# Patient Record
Sex: Female | Born: 1945 | Race: White | Hispanic: No | Marital: Married | Smoking: Never smoker
Health system: Southern US, Community
[De-identification: ages and names within clinical notes are randomized; demographics above are authoritative.]

## PROBLEM LIST (undated history)

## (undated) DIAGNOSIS — K219 Gastro-esophageal reflux disease without esophagitis: Secondary | ICD-10-CM

## (undated) DIAGNOSIS — E785 Hyperlipidemia, unspecified: Secondary | ICD-10-CM

## (undated) DIAGNOSIS — I509 Heart failure, unspecified: Secondary | ICD-10-CM

## (undated) DIAGNOSIS — N1330 Unspecified hydronephrosis: Secondary | ICD-10-CM

## (undated) DIAGNOSIS — F32A Depression, unspecified: Secondary | ICD-10-CM

## (undated) DIAGNOSIS — F329 Major depressive disorder, single episode, unspecified: Secondary | ICD-10-CM

## (undated) DIAGNOSIS — G4730 Sleep apnea, unspecified: Secondary | ICD-10-CM

## (undated) DIAGNOSIS — M810 Age-related osteoporosis without current pathological fracture: Secondary | ICD-10-CM

## (undated) DIAGNOSIS — J45909 Unspecified asthma, uncomplicated: Secondary | ICD-10-CM

## (undated) DIAGNOSIS — K760 Fatty (change of) liver, not elsewhere classified: Secondary | ICD-10-CM

## (undated) DIAGNOSIS — C801 Malignant (primary) neoplasm, unspecified: Secondary | ICD-10-CM

## (undated) DIAGNOSIS — N135 Crossing vessel and stricture of ureter without hydronephrosis: Secondary | ICD-10-CM

## (undated) DIAGNOSIS — E119 Type 2 diabetes mellitus without complications: Secondary | ICD-10-CM

## (undated) DIAGNOSIS — K5790 Diverticulosis of intestine, part unspecified, without perforation or abscess without bleeding: Secondary | ICD-10-CM

## (undated) DIAGNOSIS — M1990 Unspecified osteoarthritis, unspecified site: Secondary | ICD-10-CM

## (undated) DIAGNOSIS — T8859XA Other complications of anesthesia, initial encounter: Secondary | ICD-10-CM

## (undated) DIAGNOSIS — M109 Gout, unspecified: Secondary | ICD-10-CM

## (undated) DIAGNOSIS — E039 Hypothyroidism, unspecified: Secondary | ICD-10-CM

## (undated) DIAGNOSIS — R0600 Dyspnea, unspecified: Secondary | ICD-10-CM

## (undated) DIAGNOSIS — R600 Localized edema: Secondary | ICD-10-CM

## (undated) DIAGNOSIS — F0280 Dementia in other diseases classified elsewhere without behavioral disturbance: Secondary | ICD-10-CM

## (undated) DIAGNOSIS — N289 Disorder of kidney and ureter, unspecified: Secondary | ICD-10-CM

## (undated) DIAGNOSIS — K682 Retroperitoneal fibrosis: Secondary | ICD-10-CM

## (undated) DIAGNOSIS — T4145XA Adverse effect of unspecified anesthetic, initial encounter: Secondary | ICD-10-CM

## (undated) DIAGNOSIS — S3710XA Unspecified injury of ureter, initial encounter: Secondary | ICD-10-CM

## (undated) DIAGNOSIS — R911 Solitary pulmonary nodule: Secondary | ICD-10-CM

## (undated) DIAGNOSIS — I10 Essential (primary) hypertension: Secondary | ICD-10-CM

## (undated) DIAGNOSIS — G309 Alzheimer's disease, unspecified: Secondary | ICD-10-CM

## (undated) DIAGNOSIS — F419 Anxiety disorder, unspecified: Secondary | ICD-10-CM

## (undated) DIAGNOSIS — Z87442 Personal history of urinary calculi: Secondary | ICD-10-CM

## (undated) DIAGNOSIS — J449 Chronic obstructive pulmonary disease, unspecified: Secondary | ICD-10-CM

## (undated) HISTORY — DX: Crossing vessel and stricture of ureter without hydronephrosis: N13.5

## (undated) HISTORY — DX: Gout, unspecified: M10.9

## (undated) HISTORY — DX: Essential (primary) hypertension: I10

## (undated) HISTORY — PX: ANKLE FRACTURE SURGERY: SHX122

## (undated) HISTORY — DX: Unspecified asthma, uncomplicated: J45.909

## (undated) HISTORY — PX: REDUCTION MAMMAPLASTY: SUR839

## (undated) HISTORY — PX: LAPAROSCOPIC HYSTERECTOMY: SHX1926

## (undated) HISTORY — DX: Anxiety disorder, unspecified: F41.9

## (undated) HISTORY — DX: Type 2 diabetes mellitus without complications: E11.9

## (undated) HISTORY — DX: Alzheimer's disease, unspecified: G30.9

## (undated) HISTORY — DX: Unspecified osteoarthritis, unspecified site: M19.90

## (undated) HISTORY — DX: Hyperlipidemia, unspecified: E78.5

## (undated) HISTORY — PX: ABDOMINAL HYSTERECTOMY: SHX81

## (undated) HISTORY — DX: Unspecified hydronephrosis: N13.30

## (undated) HISTORY — PX: REPLACEMENT TOTAL KNEE: SUR1224

## (undated) HISTORY — DX: Hypothyroidism, unspecified: E03.9

## (undated) HISTORY — DX: Retroperitoneal fibrosis: K68.2

## (undated) HISTORY — DX: Depression, unspecified: F32.A

## (undated) HISTORY — DX: Solitary pulmonary nodule: R91.1

## (undated) HISTORY — PX: OTHER SURGICAL HISTORY: SHX169

## (undated) HISTORY — DX: Dementia in other diseases classified elsewhere, unspecified severity, without behavioral disturbance, psychotic disturbance, mood disturbance, and anxiety: F02.80

## (undated) HISTORY — DX: Major depressive disorder, single episode, unspecified: F32.9

---
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
## 1988-08-06 HISTORY — PX: BREAST SURGERY: SHX581

## 1998-08-06 HISTORY — PX: CARPAL TUNNEL RELEASE: SHX101

## 2003-08-07 HISTORY — PX: HERNIA REPAIR: SHX51

## 2005-05-30 ENCOUNTER — Ambulatory Visit: Payer: Self-pay | Admitting: General Surgery

## 2005-06-12 ENCOUNTER — Ambulatory Visit: Payer: Self-pay | Admitting: General Surgery

## 2005-06-13 ENCOUNTER — Ambulatory Visit: Payer: Self-pay | Admitting: General Surgery

## 2007-09-09 ENCOUNTER — Ambulatory Visit: Payer: Self-pay | Admitting: Unknown Physician Specialty

## 2007-09-16 ENCOUNTER — Ambulatory Visit: Payer: Self-pay | Admitting: Unknown Physician Specialty

## 2008-11-17 ENCOUNTER — Ambulatory Visit: Payer: Self-pay | Admitting: Obstetrics and Gynecology

## 2008-12-08 ENCOUNTER — Ambulatory Visit: Payer: Self-pay | Admitting: Obstetrics & Gynecology

## 2009-08-06 HISTORY — PX: JOINT REPLACEMENT: SHX530

## 2009-12-26 ENCOUNTER — Ambulatory Visit: Payer: Self-pay | Admitting: General Practice

## 2010-01-13 ENCOUNTER — Inpatient Hospital Stay: Payer: Self-pay | Admitting: General Practice

## 2010-01-16 ENCOUNTER — Encounter: Payer: Self-pay | Admitting: Internal Medicine

## 2010-02-03 ENCOUNTER — Encounter: Payer: Self-pay | Admitting: Internal Medicine

## 2010-09-06 ENCOUNTER — Ambulatory Visit: Payer: Self-pay

## 2010-12-19 NOTE — Assessment & Plan Note (Signed)
NAME:  Lori Blanchard, Lori Blanchard                ACCOUNT NO.:  [id number]   MEDICAL RECORD NO.:  [id number]          PATIENT TYPE:  POB   LOCATION:  [facility] at [facility]         FACILITY:  [facility]   PHYSICIAN:  Argentina Donovan, MD        DATE OF BIRTH:  09/09/1945   DATE OF SERVICE:                                  CLINIC NOTE   The patient is a 65 year old Caucasian female gravida 3, para 3-0-0-3,  with a history of 3 cesarean sections who is in because of a complaint  of 6 months duration of lower abdominal pain especially on the left, but  across the whole pelvic area.  The patient has a long medical history  with hyperlipidemia, hypertension, diabetes type 2, and hypothyroidism.  She needs a knee replacement and has shortness of breath on any length,  any time of walking.  She has been followed by private internist for her  medical problems and has multiple medications as you can see by the  chart.   SURGICAL HISTORY:  Three cesarean sections and appendectomy, breast  reduction, a total abdominal hysterectomy.  She had abdominoplasty with  repair of an umbilical hernia and she has had carpal tunnel surgery.   FAMILY HISTORY:  Significant for diabetes, high blood pressure with her  father and mother had breast cancer in early 40s and died from  metastasis at the age of 66.   The patient says that this abdominal pain started about 6 months ago.  She has almost all the times and sometimes it wakes her up especially  with the pain in her left side lower quadrant, which radiates around to  her left side.   PHYSICAL EXAMINATION:  VITAL SIGNS:  Her blood pressure is 130/72, her  pulse is 90, her weight is 192, and she is 4 feet 8 inches tall.  GENERAL:  She is a well-developed, rounded little Caucasian lady in no  acute distress.  ABDOMEN:  Rotund.  Seems almost distended, but nontender to palpation  without guarding or rebound.  Good bowel sounds.  A large abdominoplasty  scar in the lower abdomen  across the entire lower abdomen and a small  umbilical scar.  The abdomen is tympanic to percussion and does not show  any sign of fluid shifts on palpation.  PELVIC:  Genitalia, external is normal.  BUS within normal limits.  The  vagina is clean, somewhat atrophic and status hysterectomy.  Bimanual  pelvic examination was nonrevealing because of the habitus of the  patient.  EXTREMITIES:  Both knees appear to be swollen.  She has mild  varicosities and no edema noted.   IMPRESSION:  Chronic pelvic pain, probably secondary to pelvic  adhesions.  Ultrasound for evaluation of the ovaries will be carried  out.  My feeling is if there is no ovarian pathology, there will not be  much that can be done to relieve this patient's symptoms.  It seem to  bother her more she says at waking up her night or when she tries to  hang clothes on the line.  She will be seen following her ultrasound  results.  ______________________________  Argentina Donovan, MD    PR/MEDQ  D:  11/17/2008  T:  11/18/2008  Job:  [id number]

## 2010-12-19 NOTE — Assessment & Plan Note (Signed)
[name], Lori Blanchard                ACCOUNT NO.:  [id number]   MEDICAL RECORD NO.:  [id number]           PATIENT TYPE:   LOCATION:  [facility] at [facility]           FACILITY:   PHYSICIAN:  Scheryl Darter, MD       DATE OF BIRTH:  [birth year]   DATE OF SERVICE:                                  CLINIC NOTE   The patient returns for followup after an ultrasound was done on November 22, 2008.  Ultrasound was ordered to evaluate pelvic pain, which is  intermittent and chronic.  The patient is status post total abdominal  hysterectomy.  Ultrasound of the pelvis states status post hysterectomy,  neither ovary could be identified.  Slight nodular prominence of the  cervix of questionable significance.  Dr. Serita Kyle examination confirms  that cervix is not present.  The patient states that she has had  colonoscopy, which diagnosed a diverticulosis.  Now the possible  etiology for the pain is diverticulosis.  Recommend any other testing by  [place].  She has other issues with pain including arthritis.  Recommend she  follow up here as requested by her physicians.      Scheryl Darter, MD     JA/MEDQ  D:  12/08/2008  T:  12/09/2008  Job:  [id number]

## 2011-08-07 HISTORY — PX: JOINT REPLACEMENT: SHX530

## 2011-08-08 ENCOUNTER — Ambulatory Visit: Payer: Self-pay | Admitting: General Practice

## 2011-08-08 DIAGNOSIS — I10 Essential (primary) hypertension: Secondary | ICD-10-CM

## 2011-08-08 LAB — BASIC METABOLIC PANEL
BUN: 16 mg/dL (ref 7–18)
Calcium, Total: 9.1 mg/dL (ref 8.5–10.1)
Co2: 24 mmol/L (ref 21–32)
Creatinine: 0.68 mg/dL (ref 0.60–1.30)
EGFR (African American): >60
EGFR (Non-African Amer.): >60
Glucose: 184 mg/dL — ABNORMAL HIGH (ref 65–99)
Potassium: 4 mmol/L (ref 3.5–5.1)
Sodium: 142 mmol/L (ref 136–145)

## 2011-08-08 LAB — URINALYSIS, COMPLETE
Blood: NEGATIVE
Ketone: NEGATIVE
Leukocyte Esterase: NEGATIVE
Nitrite: NEGATIVE
Ph: 7 (ref 4.5–8.0)
Protein: NEGATIVE
RBC,UR: 4 /HPF (ref 0–5)
Squamous Epithelial: 3

## 2011-08-08 LAB — CBC
HCT: 32.9 % — ABNORMAL LOW (ref 35.0–47.0)
MCH: 24.4 pg — ABNORMAL LOW (ref 26.0–34.0)
MCHC: 31 g/dL — ABNORMAL LOW (ref 32.0–36.0)
MCV: 79 fL — ABNORMAL LOW (ref 80–100)
RBC: 4.19 10*6/uL (ref 3.80–5.20)
RDW: 15.4 % — ABNORMAL HIGH (ref 11.5–14.5)

## 2011-08-08 LAB — PROTIME-INR
INR: 1
Prothrombin Time: 13 secs (ref 11.5–14.7)

## 2011-08-08 LAB — SEDIMENTATION RATE: Erythrocyte Sed Rate: 13 mm/hr (ref 0–30)

## 2011-08-22 ENCOUNTER — Inpatient Hospital Stay: Payer: Self-pay | Admitting: General Practice

## 2011-08-23 ENCOUNTER — Encounter: Payer: Self-pay | Admitting: Internal Medicine

## 2011-08-23 LAB — BASIC METABOLIC PANEL
BUN: 13 mg/dL (ref 7–18)
Calcium, Total: 7.8 mg/dL — ABNORMAL LOW (ref 8.5–10.1)
EGFR (African American): >60
EGFR (Non-African Amer.): >60
Glucose: 184 mg/dL — ABNORMAL HIGH (ref 65–99)
Osmolality: 290 (ref 275–301)
Sodium: 143 mmol/L (ref 136–145)

## 2011-08-23 LAB — HEMOGLOBIN: HGB: 9 g/dL — ABNORMAL LOW (ref 12.0–16.0)

## 2011-08-23 LAB — PLATELET COUNT: Platelet: 295 10*3/uL (ref 150–440)

## 2011-08-24 LAB — PLATELET COUNT: Platelet: 277 10*3/uL (ref 150–440)

## 2011-08-24 LAB — BASIC METABOLIC PANEL
BUN: 10 mg/dL (ref 7–18)
Calcium, Total: 7.9 mg/dL — ABNORMAL LOW (ref 8.5–10.1)
EGFR (African American): >60
EGFR (Non-African Amer.): >60
Glucose: 169 mg/dL — ABNORMAL HIGH (ref 65–99)
Osmolality: 290 (ref 275–301)
Potassium: 3.8 mmol/L (ref 3.5–5.1)
Sodium: 144 mmol/L (ref 136–145)

## 2011-08-25 LAB — HEMOGLOBIN: HGB: 9.1 g/dL — ABNORMAL LOW (ref 12.0–16.0)

## 2011-09-07 ENCOUNTER — Encounter: Payer: Self-pay | Admitting: Internal Medicine

## 2012-10-13 ENCOUNTER — Ambulatory Visit: Payer: Self-pay | Admitting: Unknown Physician Specialty

## 2012-11-13 ENCOUNTER — Ambulatory Visit: Payer: Self-pay | Admitting: Unknown Physician Specialty

## 2012-11-14 LAB — PATHOLOGY REPORT

## 2012-11-28 ENCOUNTER — Ambulatory Visit: Payer: Self-pay

## 2012-12-11 ENCOUNTER — Ambulatory Visit: Payer: Self-pay

## 2012-12-30 ENCOUNTER — Ambulatory Visit: Payer: Self-pay | Admitting: Internal Medicine

## 2013-01-20 ENCOUNTER — Ambulatory Visit: Payer: Self-pay | Admitting: Urology

## 2013-01-30 ENCOUNTER — Ambulatory Visit: Payer: Self-pay | Admitting: Urology

## 2013-03-18 ENCOUNTER — Ambulatory Visit: Payer: Self-pay | Admitting: Oncology

## 2013-03-19 ENCOUNTER — Ambulatory Visit: Payer: Self-pay | Admitting: Oncology

## 2013-03-30 LAB — CBC CANCER CENTER
Basophil #: 0.1 x10 3/mm (ref 0.0–0.1)
Eosinophil #: 0.3 x10 3/mm (ref 0.0–0.7)
HGB: 10.3 g/dL — ABNORMAL LOW (ref 12.0–16.0)
Lymphocyte #: 2.3 x10 3/mm (ref 1.0–3.6)
MCHC: 32.6 g/dL (ref 32.0–36.0)
MCV: 75 fL — ABNORMAL LOW (ref 80–100)
Monocyte #: 0.9 x10 3/mm (ref 0.2–0.9)
Monocyte %: 7.5 %
Neutrophil %: 69.2 %
Platelet: 345 x10 3/mm (ref 150–440)
RBC: 4.18 10*6/uL (ref 3.80–5.20)
RDW: 15.4 % — ABNORMAL HIGH (ref 11.5–14.5)
WBC: 11.6 x10 3/mm — ABNORMAL HIGH (ref 3.6–11.0)

## 2013-03-30 LAB — COMPREHENSIVE METABOLIC PANEL
Alkaline Phosphatase: 61 U/L (ref 50–136)
Anion Gap: 7 (ref 7–16)
Bilirubin,Total: 0.5 mg/dL (ref 0.2–1.0)
Calcium, Total: 8.9 mg/dL (ref 8.5–10.1)
Chloride: 105 mmol/L (ref 98–107)
Co2: 25 mmol/L (ref 21–32)
EGFR (Non-African Amer.): 38 — ABNORMAL LOW
Osmolality: 284 (ref 275–301)
SGOT(AST): 29 U/L (ref 15–37)
SGPT (ALT): 24 U/L (ref 12–78)
Total Protein: 7.1 g/dL (ref 6.4–8.2)

## 2013-04-06 ENCOUNTER — Ambulatory Visit: Payer: Self-pay | Admitting: Oncology

## 2013-04-15 LAB — IRON AND TIBC
Iron Bind.Cap.(Total): 567 ug/dL — ABNORMAL HIGH
Iron Saturation: 6 %
Iron: 35 ug/dL — ABNORMAL LOW
Unbound Iron-Bind.Cap.: 532 ug/dL

## 2013-04-15 LAB — RETICULOCYTES: Absolute Retic Count: 0.0784 10*6/uL (ref 0.019–0.186)

## 2013-04-15 LAB — FERRITIN: Ferritin (ARMC): 16 ng/mL (ref 8–388)

## 2013-04-15 LAB — LACTATE DEHYDROGENASE: LDH: 172 U/L (ref 81–246)

## 2013-05-06 ENCOUNTER — Ambulatory Visit: Payer: Self-pay | Admitting: Oncology

## 2013-05-18 ENCOUNTER — Ambulatory Visit: Payer: Self-pay | Admitting: Urology

## 2013-05-18 LAB — BASIC METABOLIC PANEL
Calcium, Total: 8.9 mg/dL (ref 8.5–10.1)
EGFR (Non-African Amer.): 51 — ABNORMAL LOW
Osmolality: 284 (ref 275–301)
Potassium: 3.8 mmol/L (ref 3.5–5.1)

## 2013-05-18 LAB — HEMOGLOBIN: HGB: 10.1 g/dL — ABNORMAL LOW (ref 12.0–16.0)

## 2013-06-01 ENCOUNTER — Ambulatory Visit: Payer: Self-pay | Admitting: Urology

## 2013-06-06 ENCOUNTER — Ambulatory Visit: Payer: Self-pay | Admitting: Oncology

## 2013-06-10 ENCOUNTER — Ambulatory Visit: Payer: Self-pay | Admitting: Specialist

## 2013-07-06 ENCOUNTER — Ambulatory Visit: Payer: Self-pay | Admitting: Oncology

## 2013-07-28 ENCOUNTER — Ambulatory Visit: Payer: Self-pay | Admitting: Specialist

## 2013-08-06 ENCOUNTER — Ambulatory Visit: Payer: Self-pay | Admitting: Oncology

## 2013-10-06 ENCOUNTER — Ambulatory Visit: Payer: Self-pay | Admitting: Oncology

## 2013-10-08 ENCOUNTER — Ambulatory Visit: Payer: Self-pay | Admitting: Specialist

## 2013-10-23 ENCOUNTER — Ambulatory Visit: Payer: Self-pay | Admitting: Internal Medicine

## 2013-10-25 LAB — BRONCHIAL WASH CULTURE

## 2013-11-04 ENCOUNTER — Ambulatory Visit: Payer: Self-pay | Admitting: Oncology

## 2013-11-13 LAB — CULTURE, FUNGUS WITHOUT SMEAR

## 2013-11-18 ENCOUNTER — Ambulatory Visit: Payer: Self-pay | Admitting: Specialist

## 2013-11-23 ENCOUNTER — Ambulatory Visit: Payer: Self-pay | Admitting: Urology

## 2013-11-23 LAB — BASIC METABOLIC PANEL
ANION GAP: 5 — AB (ref 7–16)
BUN: 19 mg/dL — ABNORMAL HIGH (ref 7–18)
CHLORIDE: 103 mmol/L (ref 98–107)
CREATININE: 1.21 mg/dL (ref 0.60–1.30)
Calcium, Total: 8.6 mg/dL (ref 8.5–10.1)
Co2: 28 mmol/L (ref 21–32)
EGFR (Non-African Amer.): 46 — ABNORMAL LOW
GFR CALC AF AMER: 54 — AB
Glucose: 412 mg/dL — ABNORMAL HIGH (ref 65–99)
Osmolality: 292 (ref 275–301)
Potassium: 3.8 mmol/L (ref 3.5–5.1)
Sodium: 136 mmol/L (ref 136–145)

## 2013-11-23 LAB — CBC
HCT: 30 % — AB (ref 35.0–47.0)
HGB: 9.4 g/dL — ABNORMAL LOW (ref 12.0–16.0)
MCH: 23.6 pg — ABNORMAL LOW (ref 26.0–34.0)
MCHC: 31.5 g/dL — ABNORMAL LOW (ref 32.0–36.0)
MCV: 75 fL — AB (ref 80–100)
Platelet: 310 10*3/uL (ref 150–440)
RBC: 3.99 10*6/uL (ref 3.80–5.20)
RDW: 15.3 % — AB (ref 11.5–14.5)
WBC: 10.9 10*3/uL (ref 3.6–11.0)

## 2013-12-03 ENCOUNTER — Ambulatory Visit: Payer: Self-pay | Admitting: Urology

## 2014-01-30 DIAGNOSIS — M1990 Unspecified osteoarthritis, unspecified site: Secondary | ICD-10-CM | POA: Insufficient documentation

## 2014-01-30 DIAGNOSIS — N183 Chronic kidney disease, stage 3 unspecified: Secondary | ICD-10-CM | POA: Insufficient documentation

## 2014-01-30 DIAGNOSIS — E1122 Type 2 diabetes mellitus with diabetic chronic kidney disease: Secondary | ICD-10-CM | POA: Insufficient documentation

## 2014-01-30 DIAGNOSIS — E039 Hypothyroidism, unspecified: Secondary | ICD-10-CM | POA: Insufficient documentation

## 2014-01-30 DIAGNOSIS — G4733 Obstructive sleep apnea (adult) (pediatric): Secondary | ICD-10-CM | POA: Insufficient documentation

## 2014-01-30 DIAGNOSIS — E1169 Type 2 diabetes mellitus with other specified complication: Secondary | ICD-10-CM | POA: Insufficient documentation

## 2014-01-30 DIAGNOSIS — E785 Hyperlipidemia, unspecified: Secondary | ICD-10-CM

## 2014-01-30 DIAGNOSIS — I10 Essential (primary) hypertension: Secondary | ICD-10-CM | POA: Insufficient documentation

## 2014-05-13 DIAGNOSIS — J449 Chronic obstructive pulmonary disease, unspecified: Secondary | ICD-10-CM | POA: Insufficient documentation

## 2014-05-26 ENCOUNTER — Ambulatory Visit: Payer: Self-pay | Admitting: Urology

## 2014-05-26 LAB — CBC WITH DIFFERENTIAL/PLATELET
BASOS ABS: 0 10*3/uL (ref 0.0–0.1)
Basophil %: 0.4 %
Eosinophil #: 0.2 10*3/uL (ref 0.0–0.7)
Eosinophil %: 1.4 %
HCT: 30.3 % — AB (ref 35.0–47.0)
HGB: 9.2 g/dL — AB (ref 12.0–16.0)
LYMPHS PCT: 21.3 %
Lymphocyte #: 2.4 10*3/uL (ref 1.0–3.6)
MCH: 22.2 pg — AB (ref 26.0–34.0)
MCHC: 30.2 g/dL — AB (ref 32.0–36.0)
MCV: 74 fL — ABNORMAL LOW (ref 80–100)
MONO ABS: 0.9 x10 3/mm (ref 0.2–0.9)
Monocyte %: 8 %
NEUTROS PCT: 68.9 %
Neutrophil #: 7.9 10*3/uL — ABNORMAL HIGH (ref 1.4–6.5)
Platelet: 327 10*3/uL (ref 150–440)
RBC: 4.12 10*6/uL (ref 3.80–5.20)
RDW: 15.4 % — ABNORMAL HIGH (ref 11.5–14.5)
WBC: 11.4 10*3/uL — ABNORMAL HIGH (ref 3.6–11.0)

## 2014-05-26 LAB — BASIC METABOLIC PANEL
Anion Gap: 9 (ref 7–16)
BUN: 16 mg/dL (ref 7–18)
CHLORIDE: 106 mmol/L (ref 98–107)
Calcium, Total: 8.2 mg/dL — ABNORMAL LOW (ref 8.5–10.1)
Co2: 26 mmol/L (ref 21–32)
Creatinine: 1.08 mg/dL (ref 0.60–1.30)
EGFR (African American): >60
GFR CALC NON AF AMER: 54 — AB
Glucose: 194 mg/dL — ABNORMAL HIGH (ref 65–99)
Osmolality: 288 (ref 275–301)
Potassium: 3.9 mmol/L (ref 3.5–5.1)
Sodium: 141 mmol/L (ref 136–145)

## 2014-06-07 ENCOUNTER — Ambulatory Visit: Payer: Self-pay | Admitting: Urology

## 2014-08-13 ENCOUNTER — Ambulatory Visit: Payer: Self-pay | Admitting: Internal Medicine

## 2014-10-18 ENCOUNTER — Ambulatory Visit: Payer: Self-pay | Admitting: Urology

## 2014-10-25 ENCOUNTER — Ambulatory Visit: Payer: Self-pay | Admitting: Urology

## 2014-11-26 NOTE — Op Note (Signed)
PATIENT NAME:  Lori Blanchard, Lori Blanchard MR#:  [id number] DATE OF BIRTH:  [birth year]  DATE OF PROCEDURE:  01/30/2013  PREOPERATIVE DIAGNOSIS: Right hydronephrosis.   POSTOPERATIVE DIAGNOSIS:  Right hydronephrosis, no cause.   OPERATION:  Right cysto, right retrograde pyelogram with stent.   ANESTHESIA: General.   SPECIMENS:  None.   FINDINGS:  Deviated ureter medially with no apparent internal obstruction.   SURGEON: Wilberta Dorvil D. Elnoria Howard, M.D.   COMPLICATIONS: None.   BLOOD LOSS:  Minimal.  PROCEDURE IN DETAIL:  With the patient sterilely prepped and draped in a supine lithotomy position for ease of approach to the external genitalia, I began. I applied the 21-French sheath and Foroblique lens into the bladder without difficulty. Ureter is easily seen, instrumented with an open-ended 5-French ureteral catheter. Contrast put up the catheter. The ureter appears to be deviated toward the midline. So, the open-ended catheter is slid up carefully to about the level of L4, and then a hydrophilic 6.754 Glidewire was placed up the ureteral catheter and easily bypasses any kinking or deviation in the ureter and straightens the ureter right out and goes into the renal pelvis. Curls well in the renal pelvis, so I am able to place a 6-French, 26 cm stent up into the kidney with the one end well curled in the bladder. The ureter is now straight, and no  hydronephrotic drip is seen. The stent is left in place. String is cut. B and O suppository placed in the rectum. Rectal exam reveals no fixation of the bladder. The patient is sent to recovery in satisfactory condition with 30 mL of 0.5% Marcaine in the bladder.     ____________________________ Janice Coffin. Elnoria Howard, DO rdh:dmm D: 01/30/2013 14:59:35 ET T: 01/30/2013 22:21:35 ET JOB#: [id number]  cc: Janice Coffin. Elnoria Howard, DO, <Dictator> Kaileigh Viswanathan D Dylyn Mclaren DO ELECTRONICALLY SIGNED 02/27/2013 16:34

## 2014-11-26 NOTE — Op Note (Signed)
PATIENT NAME:  KEVIN, MARIO MR#:  [id number] DATE OF BIRTH:  Jul 11, 1946  DATE OF PROCEDURE:  06/01/2013  PREOPERATIVE DIAGNOSIS: Chronic right hydronephrosis with need for stent exchange and left flank pain.   POSTOPERATIVE DIAGNOSES:  1.  Normal left renal collecting system.  2.  Chronic  right hydronephrosis stent exchange.   PROCEDURE:   1.  Cystoscopy.  2.  Stent exchange.  3.  Left retrograde pyelogram.   PROCEDURE IN DETAIL:  With the patient sterilely prepped and draped in supine lithotomy position approach, easily approached to the external genitalia, I began. After appropriate timeout, I entered the bladder with a 21 French panendoscope sheath and Foroblique lens and removed the right stent. Instrument  was a 0.038 Glidewire and moved the stent over the wire. The wire was in in good position. So, I placed the 24 cm stent into position in the renal pelvis and bladder. Then, through the 5 [place] open-ended catheter, I did a left retrograde pyelogram. Left side has no hydronephrosis, perfectly normal with no caliectasis or evidence of obstructing stone, tumor mass or growth in the left ureter or kidney. So the bladder was then emptied. The position of the stent is checked. It is curled in good position in the bladder and in the renal pelvis on the right and string is cut, bladder is emptied, 30 mL of Marcaine placed in the bladder. B and O suppository placed in the rectum. The bladder itself showed no tumors, masses, growth, infection or sign even sign of irritation from the stent.   ____________________________ Janice Coffin. Elnoria Howard, [place] rdh:cc D: 06/01/2013 16:23:27 ET T: 06/01/2013 23:56:34 ET JOB#: [id number]  cc: Janice Coffin. Elnoria Howard, DO, <Dictator> RICHARD D HART DO ELECTRONICALLY SIGNED 06/29/2013 7:05

## 2014-11-27 NOTE — Op Note (Signed)
PATIENT NAME:  Lori Blanchard, Lori Blanchard MR#:  [id number] DATE OF BIRTH:  Nov 23, 1945  DATE OF PROCEDURE:  06/07/2014  PREOPERATIVE DIAGNOSIS: Persistent right hydronephrosis.   POSTOPERATIVE DIAGNOSIS: Persistent right hydronephrosis.    PROCEDURE: Stent exchange.   ANESTHESIA: General.   COMPLICATIONS: None.   DESCRIPTION OF PROCEDURE: The patient was sterilely prepped and draped in supine lithotomy position, and after appropriate timeout, I placed the scope and the stent graspers into the bladder. The right sided stent was grasped and removed. Then an 0.038 wire, which is a Sensor wire, was placed up the ureter and over this a 6 French 24 cm stent is placed into the kidney and then into the bladder. This 24 cm stent is in good position with both ends curled both in the bladder and renal pelvis. The patient tolerated the procedure well. Bladder was emptied, 30 mL of 0.5% Marcaine placed in the bladder and she is sent to recovery in satisfactory condition after B ad O suppository was placed in her rectum. No rectal mass or growth felt. There are hemorrhoids in the rectum.     ____________________________ Janice Coffin. Elnoria Howard, DO rdh:AT D: 06/07/2014 14:31:27 ET T: 06/07/2014 22:50:18 ET JOB#: [id number]  cc: Janice Coffin. Elnoria Howard, DO, <Dictator> Britanie Harshman D Cailynn Bodnar DO ELECTRONICALLY SIGNED 06/21/2014 16:34

## 2014-11-27 NOTE — Op Note (Signed)
PATIENT NAME:  Lori Blanchard, Lori Blanchard MR#:  [id number] DATE OF BIRTH:  1946/03/07  DATE OF PROCEDURE:  12/03/2013  PREOPERATIVE DIAGNOSIS: Ureteral obstruction with hydronephrosis, external obstruction, right ureter.   POSTOPERATIVE DIAGNOSIS: Ureteral obstruction with hydronephrosis, external obstruction, right ureter.   PROCEDURE: Cystoscopy with placement of a right ureteral stent.   ANESTHESIA: General.   SURGEON: Richard D. Elnoria Howard, DO  COMPLICATIONS: None.   DESCRIPTION OF PROCEDURE: The patient was sterilely prepped and draped in supine lithotomy position, and after an appropriate timeout agreed to by all parties, the procedure begins. Cystoscopy is done, and then utilizing fluoroscopy, a 0.038 wire was placed up the right ureter into the renal pelvis. Over the wire, a 24 cm, 6 French ureteral catheter was placed in its proper placement in the renal pelvis and the bladder. It is checked at the beginning and end of the procedure both with fluoroscopy and visualization. The bladder otherwise shows no tumors, masses, gross evidence of infection, cystitis. Ureters are in normal position and have normal orifices. Bladder is emptied, and 30 mL of 0.5% Marcaine is placed in the bladder. She is sent to recovery in satisfactory condition.   ____________________________ Janice Coffin. Elnoria Howard, DO rdh:lb D: 12/03/2013 12:15:44 ET T: 12/03/2013 13:22:54 ET JOB#: [id number]  cc: Janice Coffin. Elnoria Howard, DO, <Dictator> RICHARD D HART DO ELECTRONICALLY SIGNED 12/04/2013 8:18

## 2014-11-28 NOTE — Op Note (Signed)
PATIENT NAME:  Lori Blanchard, Lori Blanchard MR#:  [id number] DATE OF BIRTH:  Apr 16, 1946  DATE OF PROCEDURE:  08/22/2011  PREOPERATIVE DIAGNOSIS: Degenerative arthrosis of the left knee.   POSTOPERATIVE DIAGNOSIS: Degenerative arthrosis of the left knee.   PROCEDURE PERFORMED: Left total knee arthroplasty using computer-assisted navigation.   SURGEON: Skip Estimable, M.D.   ASSISTANT: Vance Peper, PA-C (required to maintain retraction throughout the procedure)   ANESTHESIA: Femoral nerve block and spinal.   ESTIMATED BLOOD LOSS: 150 mL.   FLUIDS REPLACED: 900 mL of crystalloid.   TOURNIQUET TIME: 95 minutes.   DRAINS: Two medium drains to reinfusion system.   SOFT TISSUE RELEASES: Anterior cruciate ligament, posterior cruciate ligament, deep medial collateral ligament, patellofemoral ligament, and posterolateral corner.   IMPLANTS UTILIZED: DePuy PFC Sigma size two posterior stabilized femoral component (cemented), size two MBT tibial component (cemented), 32-mm three peg oval dome patella (cemented), and a 10-mm stabilized rotating platform polyethylene insert.   INDICATIONS FOR SURGERY: The patient is a 69 year old female who has been seen for complaints of progressive left knee pain and valgus deformity. X-rays demonstrated severe degenerative changes in tricompartmental fashion with significant valgus deformity. After discussion of the risks and benefits of surgical intervention, the patient expressed her understanding of the risks and benefits and agreed with plans for surgical intervention.   PROCEDURE IN DETAIL: The patient was brought into the operating room and, after adequate femoral nerve block and spinal anesthesia was achieved, a tourniquet was placed on the patient's upper left thigh. The patient's left knee and leg were cleaned and prepped with alcohol and DuraPrep and draped in the usual sterile fashion. A "time-out" was performed as per usual protocol. The left lower extremity was  exsanguinated using an Esmarch, and the tourniquet was inflated to 300 mmHg. An anterior longitudinal incision was made followed by a standard mid vastus approach. A moderate effusion was evacuated.  The deep fibers of the medial collateral ligament were elevated in a subperiosteal fashion off the medial flare of the tibia so as to maintain a continuous soft tissue sleeve. The patella was subluxed laterally and the patellofemoral ligament was incised. Inspection of the knee demonstrated severe degenerative changes with evidence of eburnated bone to the lateral compartment. Prominent osteophytes were debrided using a rongeur. Anterior and posterior cruciate ligaments were excised. Two 4-mm Schanz pins were inserted into the femur and into the tibia for attachment of the array of spheres used for computer-assisted navigation. The hip center was identified using a circumduction technique. Distal landmarks were mapped using the computer. The distal femur and proximal tibia were mapped using the computer. Distal femoral cutting guide was positioned using computer-assisted navigation so as to achieve a 5-degree distal valgus cut. The cut was performed and verified using the computer. The distal femur was sized and it was felt that a size two femoral component was appropriate. A size two cutting guide was positioned using computer-assisted navigation and the anterior cut was performed and verified using the computer. This was followed by completion of the posterior and chamfer cuts. Femoral cutting guide for the central box was then positioned and the central box cut was performed.   Attention was then directed to the proximal tibia. Medial and lateral menisci were excised. The extramedullary tibial cutting guide was positioned using computer-assisted navigation so as to achieve 0-degree varus valgus alignment and 0-degree posterior slope. Cut was performed and verified using the computer. The proximal tibia was sized  and it was felt that a  size two tibial tray was appropriate. Tibial and femoral trials were inserted followed by insertion of a 10-mm polyethylene trial. The knee was felt to be tight laterally. Trial components were removed. The knee was placed in extension and distracted using [place] retractors. The posterolateral corner was felt to be extremely tight. The posterolateral corner was carefully released using a combination of electrocautery and Metzenbaum scissors. Trial components were reinserted followed by insertion of a 10-mm polyethylene insert. Good medial and lateral soft tissue balancing was appreciated both in full extension and in 90 degrees of flexion. Finally, the patella was cut and prepared so as to accommodate a 32-mm three peg oval dome patella. Patellar trial was placed and the knee was placed through a range of motion with excellent patellar tracking appreciated.   The femoral trial was removed. Central post hole for the tibial component was reamed followed by insertion of a keel punch. Tibial trial was then removed. Cut surfaces of bone were irrigated with copious amounts of normal saline with antibiotic solution using pulsatile lavage and then suctioned dry. Polymethyl methacrylate cement with gentamicin was prepared in the usual fashion using a vacuum mixer. Gentamicin cement was used due to the patient's history of diabetes. Cement was applied to the cut surface of the tibia as well as along the undersurface of a size 2 MBT tibial component. The tibial component was positioned and impacted into place. Excess cement was removed using [employer] fast streamer. Cement was then applied to the cut surface of the femur as well as along the undersurface of a size 2 posterior stabilized femoral component. Femoral component was positioned and impacted into place. Excess cement was removed using [employer] fast streamer. A 10-mm polyethylene trial was inserted and the knee was brought into full extension with steady  axial compression applied. Finally, cement was applied to the backside of a 32-mm three peg oval dome patella and the patellar component was positioned and patellar clamp applied. Excess cement was removed using [employer] fast streamer.   After adequate curing of cement, the tourniquet was deflated after a total tourniquet time of 95 minutes. Hemostasis was achieved using electrocautery. The knee was irrigated with copious amounts of normal saline with antibiotic solution using pulsatile lavage and then suctioned dry. The knee was inspected for any residual cement debris. 30 mL of 0.25% Marcaine with epinephrine was injected along the posterior capsule. A 10-mm stabilized rotating platform polyethylene insert was inserted and the knee was placed through a range of motion. Excellent patellar tracking was appreciated and good medial and lateral soft tissue balancing was appreciated. Two medium drains were placed in the wound bed and brought out through a separate stab incision to be attached to a reinfusion system. The medial parapatellar portion of the incision was reapproximated using interrupted sutures of #1 Vicryl. The subcutaneous tissue was approximated in layers using first #0 Vicryl followed by #2-0 Vicryl.  The skin was closed with skin staples. Sterile dressing was applied.   The patient tolerated the procedure well. She was transported to the recovery room in stable condition.    ____________________________ Laurice Record. Holley Bouche., MD jph:bjt D: 08/22/2011 18:14:27 ET T: 08/23/2011 09:58:48 ET JOB#: [id number]  cc: Laurice Record. Holley Bouche., MD, <Dictator> Laurice Record Holley Bouche MD ELECTRONICALLY SIGNED 08/24/2011 6:41

## 2014-11-28 NOTE — Discharge Summary (Signed)
PATIENT NAME:  Lori Blanchard, HUBERS MR#:  [id number] DATE OF BIRTH:  1946-01-11  DATE OF ADMISSION:  08/22/2011 DATE OF DISCHARGE:  08/25/2011  ADMITTING DIAGNOSIS: Degenerative arthrosis of left knee.   DISCHARGE DIAGNOSIS: Degenerative arthrosis of left knee.   HISTORY: Patient is a 69 year old pleasant female who has been followed at [facility] for discomfort to the left knee. She had reported a long-term history of left knee pain. The patient had previously undergone a left knee arthroscopy. She continued to have increasing discomfort to the left knee. She did not see any improvement following the cortisone as well as activity modification and anti-inflammatory. Patient had localized most of the pain along the medial aspect of the knee. She had also noted some decrease in her range of motion. Her pain was noted to be aggravated with weight-bearing activities. On occasion patient had reported some swelling of the knee as well as near giving way but denied any gross locking. She states the pain had progressed to the point that it was significantly interfering with her activities of daily living. X-rays taken in the clinic showed narrowing of the lateral cartilage space with associated valgus alignment. She was noted to have osteophyte as well as subchondral sclerosis. After discussion of the risks and benefits of surgical intervention, the patient expressed her understanding of the risks and benefits and agreed for plans for surgical intervention.   PROCEDURE: Left total knee arthroplasty using computer-assisted navigation.   ANESTHESIA: Femoral nerve block with spinal.   SOFT TISSUE RELEASE: Anterior cruciate ligament, posterior cruciate ligament, deep medial collateral ligament, patellofemoral ligament, as well as the posterolateral corner.   IMPLANTS UTILIZED: DePuy PFC size 2 posterior stabilized femoral component (cemented), size 2 MBT tibial component (cemented), 32 mm three pegged  oval dome patella (cemented), and a 10 mm stabilized rotating platform polyethylene insert.   HOSPITAL COURSE: Patient tolerated procedure very well. She had no complications. She was then taken to PAC-U where she was stabilized then transferred to the orthopedic floor. She began receiving anticoagulation therapy of Lovenox 30 mg subcutaneous every 12 hours per anesthesia and pharmacy protocol. She was fitted with TED stockings bilaterally. These are allowed to be removed one hour per eight hour shift. The left one was applied on day two following removal of the Hemovac and dressing change. Patient was also fitted with the AV-I compression foot pumps bilaterally set at 130 mmHg. Her calves have been nontender, free of any evidence of any deep venous thromboses. Negative Homans sign. Heels were elevated off the bed using rolled towels. Heels have been nontender. No tissue breakdown noted.   Patient's vital signs have been stable. She has been afebrile. Hemodynamically she was stable. No transfusions were given other than the Autovac transfusions given the first six hours. Laboratory studies have all been within normal limits. Hemoglobin did drop to 8.5 mg but she was asymptomatic. No transfusions were given.   Physical therapy was initiated on day one for gait training and transfers. This has been extremely slow. Occupational therapy was also initiated on day one for activities of daily living and assistive devices.   Patient's IV, Foley and Hemovac were all discontinued on day two along with a dressing change. The wound was free of any drainage or any signs of infection. Polar Care was reapplied to the surgical leg maintaining a temperature of 40 to 50 degrees Fahrenheit.   DISPOSITION: Patient is being discharged to skilled nursing in improved stable condition.   DISCHARGE  INSTRUCTIONS:  1. She will continue weight-bearing as tolerated.  2. Knee immobilizer until she is able to do 10 straight leg  raises on her own.  3. Incentive spirometry every hour while awake.  4. Encourage cough, deep breathing every two hours while awake.  5. Polar Care to the surgical leg maintaining a temperature of 40 to 50 degrees Fahrenheit.  6. Continue with TED stockings. These are allowed to be removed one hour per eight hour shift.  7. Physical therapy for gait training and transfers. Occupational therapy for activities of daily living and assistive devices.  8. She is placed on an ADA diet.  9. Breathing treatments as needed.  10. She has a follow-up appointment on January 31 with Vance Peper, PA at 3:00 p.m. A second follow-up appointment with Dr. Skip Estimable at 9:45 on February 28. She is to call the clinic sooner if any complications.   DRUG ALLERGIES: Ibuprofen.   MEDICATIONS:  1. Tylenol ES 500 to 1000 mg every 4 to 6 hours p.r.n. for pain.  2. Celebrex 200 mg b.i.d.  3. Roxicodone 5 to 10 mg every four hours p.r.n.  4. Ultram 50 to 100 mg every four hours p.r.n.  5. Dulcolax suppositories 10 mg rectally daily p.r.n. for constipation.  6. Milk of magnesia 30 mL b.i.d.  7. Enema soapsuds if no results with milk of magnesia or Dulcolax.  8. Mylanta DS 30 mL every six hours p.r.n.  9. Pantoprazole 40 mg b.i.d.  10. Senokot-S 1 tablet b.i.d.  11. Insulin sliding scale Novolin R injections.  12. Allopurinol 200 mg daily. 13. Fosamax 70 me weekly.  14. Lipitor 40 mg daily. 15. Glucophage 1000 mg b.i.d. with meals.  16. Micardis 40 mg daily. 17. TriCor micronized 145 mg daily. 18. Neurontin 200 mg t.i.d.  19. Celexa 20 mg daily. Albuterol SVN 2.5 q.i.d.  20. Humalog Mix 50/50, 15 units subcutaneous twice a day a.c.  21. Synthroid 0.05 mg q.6 a.m.  22. Lasix 20 mg daily. 23. Vitamin E capsule 400 units daily.  24. Lovenox 30 mg subcutaneous every 12 hours for 14 days, then discontinue and begin taking one 81 mg enteric coated aspirin per day.   PAST MEDICAL HISTORY:   1. Arthritis. 2. Chickenpox. 3. Diabetes. 4. Gout.  5. Hernia.  6. Hypercholesterolemia.      7. Hypertension.  8. Shingles.   ____________________________ Vance Peper, PA jrw:cms D: 08/24/2011 09:26:00 ET T: 08/24/2011 09:51:22 ET JOB#: [id number]  cc: Vance Peper, PA, <Dictator> Neftaly Inzunza PA ELECTRONICALLY SIGNED 08/24/2011 16:12

## 2014-12-05 NOTE — Op Note (Signed)
PATIENT NAME:  Lori Blanchard, Lori Blanchard MR#:  [id number] DATE OF BIRTH:  1946/05/03  DATE OF PROCEDURE:  10/25/2014  PREOPERATIVE DIAGNOSIS: Right hydronephrosis with ureteral stricture.   POSTOPERATIVE DIAGNOSIS: Right hydronephrosis with ureteral stricture.   PROCEDURE: Cystoscopy, removal of right stent and replacement with a 1-year stent.   ANESTHESIA: General.   COMPLICATIONS: None.   FINDINGS: Continued right hydronephrosis with stricture and external compression, the stricture from external compression.   SURGEON: Kanani Mowbray D. Elnoria Howard, DO.   ANESTHESIA: General.   DESCRIPTION OF PROCEDURE:  With the patient sterilely prepped and draped in supine lithotomy position after an appropriate timeout, we put a wire up and removed the right stent and over the wire put a permanent stent exchange system from [facility], removed the inner working and place through the clear, working a metal stent. Put the stent all the way up into the kidney. In a push-pull method we are able to maintain it in position in both the ureter and the bladder, it was curled well in the kidney and properly in the bladder. The stent is in good position in both, it is checked both cystoscopically and by fluoroscopy. Then we empty the bladder and 30 mL of Marcaine are placed in the bladder and I put a B and O suppository in the rectum. No rectal masses or tumors he fell. She was sent to recovery in satisfactory condition.    ____________________________ Janice Coffin. Elnoria Howard, DO rdh:bu D: 10/25/2014 15:41:02 ET T: 10/25/2014 16:22:43 ET JOB#: [id number]  cc: Janice Coffin. Elnoria Howard, DO, <Dictator> Aleksey Newbern D Idabelle Mcpeters DO ELECTRONICALLY SIGNED 11/15/2014 12:57

## 2014-12-28 DIAGNOSIS — Z0000 Encounter for general adult medical examination without abnormal findings: Secondary | ICD-10-CM | POA: Insufficient documentation

## 2015-03-30 ENCOUNTER — Ambulatory Visit: Payer: Self-pay | Admitting: Urology

## 2015-04-05 ENCOUNTER — Encounter: Payer: Self-pay | Admitting: Urology

## 2015-04-13 ENCOUNTER — Ambulatory Visit: Payer: Self-pay | Admitting: Urology

## 2015-04-21 ENCOUNTER — Ambulatory Visit (INDEPENDENT_AMBULATORY_CARE_PROVIDER_SITE_OTHER): Payer: Medicare Other | Admitting: Obstetrics and Gynecology

## 2015-04-21 ENCOUNTER — Encounter: Payer: Self-pay | Admitting: Obstetrics and Gynecology

## 2015-04-21 VITALS — BP 151/84 | HR 97 | Ht <= 58 in | Wt 160.2 lb

## 2015-04-21 DIAGNOSIS — Z960 Presence of urogenital implants: Secondary | ICD-10-CM

## 2015-04-21 DIAGNOSIS — N135 Crossing vessel and stricture of ureter without hydronephrosis: Secondary | ICD-10-CM | POA: Diagnosis not present

## 2015-04-21 NOTE — Progress Notes (Signed)
04/21/2015 9:26 AM   Lori Blanchard 01/20/1946 [id number]  Referring provider: No referring provider defined for this encounter.  Chief Complaint  Patient presents with  . 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 He presents today to discuss planning and preop for her stent exchange. She denies any changes in her past medical history. She recently saw her pulmonologist Dr. Raul Del and was told her pulmonary function was slightly better than her baseline.  She is complaining of some stent discomfort today.  Last stent exchange 10/25/14 by Dr. Elnoria Howard for ureteral stricture and right hydronephrosis.   PMH: Past Medical History  Diagnosis Date  . Hyperlipemia   . Hypertension   . Asthma   . Osteoarthritis   . Gout   . Osteoarthritis   . Hypothyroidism   . Anxiety and depression   . Diabetes   . Hydronephrosis   . Retroperitoneal fibrosis   . Pulmonary nodule     Surgical History: Past Surgical History  Procedure Laterality Date  . Laparoscopic hysterectomy    . Carpal tunnel release    . Breast surgery    . Replacement total knee Bilateral   . Cesarean section      Home Medications:    Medication List       This list is accurate as of: 04/21/15 11:59 PM.  Always use your most recent med list.               albuterol (2.5 MG/3ML) 0.083% nebulizer solution  Commonly known as:  PROVENTIL  Inhale into the lungs.     PROAIR HFA 108 (90 BASE) MCG/ACT inhaler  Generic drug:  albuterol  USE 2 PUFFS EVERY FOUR HOURS AS NEEDED.     alendronate 70 MG tablet  Commonly known as:  FOSAMAX  TAKE ONE TABLET EVERY WEEK. TAKE WITH A FUUL GLASS OF WATER AND DO NOT LIE DOWN FOR THE NEXT 30 MINUTES.     aspirin EC 81 MG tablet  Take by mouth.     atorvastatin 40 MG tablet  Commonly known  as:  LIPITOR  Take by mouth.     buPROPion 150 MG 24 hr tablet  Commonly known as:  WELLBUTRIN XL  Take by mouth.     fenofibrate 145 MG tablet  Commonly known as:  TRICOR  TAKE ONE (1) TABLET EACH DAY     furosemide 20 MG tablet  Commonly known as:  LASIX  Take by mouth.     insulin lispro 100 UNIT/ML KiwkPen  Commonly known as:  HUMALOG  Inject into the skin.     LANTUS SOLOSTAR 100 UNIT/ML Solostar Pen  Generic drug:  Insulin Glargine     levothyroxine 50 MCG tablet  Commonly known as:  SYNTHROID, LEVOTHROID  TAKE 1 TABLET ON AN EMPTY STOMACH AT LEAST 30-60 MINUTES BEFORE BREAKFAST     metFORMIN 1000 MG tablet  Commonly known as:  GLUCOPHAGE  TAKE 1 TABLET BY MOUTH IN THE MORNING AND 1 AND 1/2 TABLETS AT SUPPER     pantoprazole 40 MG tablet  Commonly known as:  PROTONIX  Take by mouth.     PEN NEEDLES 31GX5/16" 31G X 8 MM Misc  Inject into the skin.     SURE COMFORT PEN NEEDLES 31G X 5 MM Misc  Generic drug:  Insulin  Pen Needle  USE 4 TIMES A DAY OR AS DIRECTED BY DOCTOR     RA VITAMIN B-12 TR 1000 MCG Tbcr  Generic drug:  Cyanocobalamin  Take by mouth.     senna 8.6 MG tablet  Commonly known as:  SENOKOT  Take by mouth.     telmisartan 40 MG tablet  Commonly known as:  MICARDIS  TAKE ONE (1) TABLET EACH DAY     VICTOZA 18 MG/3ML Sopn  Generic drug:  Liraglutide  INJECT 1.8 MG SUBCUTANEOUSLY ONCE DAILY     vitamin E 1000 UNIT capsule  Take by mouth.        Allergies:  Allergies  Allergen Reactions  . Ibuprofen Itching, Nausea Only and Other (See Comments)    Family History: Family History  Problem Relation Age of Onset  . Liver cancer Mother   . Colon cancer Mother   . Breast cancer Mother   . Diabetes Daughter   . Kidney disease Daughter     adrenal tumors  . Kidney cancer Neg Hx   . Prostate cancer Neg Hx     Social History:  reports that she has never smoked. She does not have any smokeless tobacco history on file. She reports  that she does not drink alcohol or use illicit drugs.  ROS: UROLOGY Frequent Urination?: Yes Hard to postpone urination?: Yes Burning/pain with urination?: Yes Get up at night to urinate?: Yes Leakage of urine?: Yes Urine stream starts and stops?: No Trouble starting stream?: No Do you have to strain to urinate?: No Blood in urine?: No Urinary tract infection?: Yes Sexually transmitted disease?: No Injury to kidneys or bladder?: No Painful intercourse?: Yes Weak stream?: Yes Currently pregnant?: No Vaginal bleeding?: No Last menstrual period?: n  Gastrointestinal Nausea?: No Vomiting?: No Indigestion/heartburn?: Yes Diarrhea?: No Constipation?: Yes  Constitutional Fever: No Night sweats?: No Weight loss?: No Fatigue?: No  Skin Skin rash/lesions?: No Itching?: No  Eyes Blurred vision?: No Double vision?: No  Ears/Nose/Throat Sore throat?: No Sinus problems?: No  Hematologic/Lymphatic Swollen glands?: No Easy bruising?: No  Cardiovascular Leg swelling?: Yes Chest pain?: No  Respiratory Cough?: Yes Shortness of breath?: Yes  Endocrine Excessive thirst?: No  Musculoskeletal Back pain?: No Joint pain?: No  Neurological Headaches?: No Dizziness?: No  Psychologic Depression?: Yes Anxiety?: Yes  Physical Exam: BP 151/84 mmHg  Pulse 97  Ht 4\' 8"  (1.422 m)  Wt 160 lb 3.2 oz (72.666 kg)  BMI 35.94 kg/m2  Constitutional:  Alert and oriented, No acute distress. HEENT: [place] AT, moist mucus membranes.  Trachea midline, no masses. Cardiovascular: No clubbing, cyanosis, or edema, RRR Respiratory: Normal respiratory effort, no increased work of breathing, slightly decreased bu clear breath sounds GI: Abdomen is soft, nontender, nondistended, no abdominal masses GU: No CVA tenderness.  Skin: No rashes, bruises or suspicious lesions. Lymph: No cervical or inguinal adenopathy. Neurologic: Grossly intact, no focal deficits, moving all 4  extremities. Psychiatric: Normal mood and affect.  Laboratory Data:   Urinalysis    Component Value Date/Time   COLORURINE Yellow 08/08/2011 1106   APPEARANCEUR Cloudy 08/08/2011 1106   LABSPEC 1.015 08/08/2011 1106   PHURINE 7.0 08/08/2011 1106   GLUCOSEU Negative 04/21/2015 1614   GLUCOSEU Negative 08/08/2011 1106   HGBUR Negative 08/08/2011 1106   BILIRUBINUR Negative 04/21/2015 1614   BILIRUBINUR Negative 08/08/2011 1106   KETONESUR Negative 08/08/2011 1106   PROTEINUR Negative 08/08/2011 1106   NITRITE Negative 04/21/2015 1614   NITRITE Negative 08/08/2011 1106  LEUKOCYTESUR Trace* 04/21/2015 1614   LEUKOCYTESUR Negative 08/08/2011 1106    Pertinent Imaging:   Assessment & Plan:  69 year old female with significant comorbidities and 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  1. Right ureteral stricture with right hydronephrosis- patient is not a good candidate for more extensive surgery to repair her right ureteral stricture. She has done well with chronic stent placement. We will schedule patient for stent exchange with Dr. Pilar Jarvis. Urine culture ordered today. Patient will need a pulmonary clearance from her pulmonologist Dr. Raul Del prior to surgery.  2. Ureteral stent retained- - Urinalysis, Complete  No Follow-up on file.  These notes generated with voice recognition software. I apologize for typographical errors.  Herbert Moors, [facility] [address], [place], [place] [address] [phone]

## 2015-04-22 DIAGNOSIS — J449 Chronic obstructive pulmonary disease, unspecified: Secondary | ICD-10-CM | POA: Insufficient documentation

## 2015-04-22 DIAGNOSIS — K469 Unspecified abdominal hernia without obstruction or gangrene: Secondary | ICD-10-CM | POA: Insufficient documentation

## 2015-04-22 DIAGNOSIS — G4710 Hypersomnia, unspecified: Secondary | ICD-10-CM | POA: Insufficient documentation

## 2015-04-22 DIAGNOSIS — F419 Anxiety disorder, unspecified: Secondary | ICD-10-CM | POA: Insufficient documentation

## 2015-04-22 DIAGNOSIS — G4730 Sleep apnea, unspecified: Secondary | ICD-10-CM

## 2015-04-22 DIAGNOSIS — E668 Other obesity: Secondary | ICD-10-CM | POA: Insufficient documentation

## 2015-04-22 DIAGNOSIS — F3341 Major depressive disorder, recurrent, in partial remission: Secondary | ICD-10-CM | POA: Insufficient documentation

## 2015-04-22 DIAGNOSIS — K760 Fatty (change of) liver, not elsewhere classified: Secondary | ICD-10-CM | POA: Insufficient documentation

## 2015-04-22 DIAGNOSIS — M109 Gout, unspecified: Secondary | ICD-10-CM | POA: Insufficient documentation

## 2015-04-22 DIAGNOSIS — K635 Polyp of colon: Secondary | ICD-10-CM | POA: Insufficient documentation

## 2015-04-22 DIAGNOSIS — J45909 Unspecified asthma, uncomplicated: Secondary | ICD-10-CM | POA: Insufficient documentation

## 2015-04-22 LAB — URINALYSIS, COMPLETE
BILIRUBIN UA: NEGATIVE
Glucose, UA: NEGATIVE
Ketones, UA: NEGATIVE
Nitrite, UA: NEGATIVE
PH UA: 5.5 (ref 5.0–7.5)
Protein, UA: NEGATIVE
Specific Gravity, UA: 1.01 (ref 1.005–1.030)
Urobilinogen, Ur: 0.2 mg/dL (ref 0.2–1.0)

## 2015-04-22 LAB — MICROSCOPIC EXAMINATION
BACTERIA UA: NONE SEEN
EPITHELIAL CELLS (NON RENAL): NONE SEEN /HPF (ref 0–10)

## 2015-04-23 LAB — CULTURE, URINE COMPREHENSIVE

## 2015-04-26 ENCOUNTER — Telehealth: Payer: Self-pay | Admitting: Radiology

## 2015-04-26 NOTE — Telephone Encounter (Signed)
Pt notified of surgery scheduled 05/11/15 and pre-admit testing appt on 05/03/15. Advised pt to be npo after mn on day of surgery and call the day before for arrival time. Pt verbalizes understanding.

## 2015-05-03 ENCOUNTER — Encounter
Admission: RE | Admit: 2015-05-03 | Discharge: 2015-05-03 | Disposition: A | Discharge status: Home / Self Care | Payer: Medicare Other | Source: Ambulatory Visit | Attending: Urology | Admitting: Urology

## 2015-05-03 DIAGNOSIS — N131 Hydronephrosis with ureteral stricture, not elsewhere classified: Secondary | ICD-10-CM | POA: Diagnosis not present

## 2015-05-03 DIAGNOSIS — Z01818 Encounter for other preprocedural examination: Secondary | ICD-10-CM | POA: Diagnosis present

## 2015-05-03 LAB — BASIC METABOLIC PANEL
Anion gap: 8 (ref 5–15)
BUN: 14 mg/dL (ref 6–20)
CALCIUM: 9 mg/dL (ref 8.9–10.3)
CO2: 24 mmol/L (ref 22–32)
Chloride: 105 mmol/L (ref 101–111)
Creatinine, Ser: 0.83 mg/dL (ref 0.44–1.00)
GFR calc Af Amer: >60 mL/min (ref >60)
GLUCOSE: 245 mg/dL — AB (ref 65–99)
Potassium: 3.9 mmol/L (ref 3.5–5.1)
Sodium: 137 mmol/L (ref 135–145)

## 2015-05-03 LAB — CBC
HEMATOCRIT: 32 % — AB (ref 35.0–47.0)
Hemoglobin: 10.1 g/dL — ABNORMAL LOW (ref 12.0–16.0)
MCH: 23.1 pg — ABNORMAL LOW (ref 26.0–34.0)
MCHC: 31.5 g/dL — AB (ref 32.0–36.0)
MCV: 73.4 fL — AB (ref 80.0–100.0)
PLATELETS: 303 10*3/uL (ref 150–440)
RBC: 4.36 MIL/uL (ref 3.80–5.20)
RDW: 15.5 % — AB (ref 11.5–14.5)
WBC: 8.6 10*3/uL (ref 3.6–11.0)

## 2015-05-03 NOTE — OR Nursing (Signed)
Cleared by Dr Raul Del

## 2015-05-03 NOTE — OR Nursing (Signed)
As instructed by Dr Assunta Gambles, request for clearance called and faxed to Dr Ouida Sills and Dr Pilar Jarvis.spoke with Randell Patient and Amy

## 2015-05-03 NOTE — Patient Instructions (Signed)
  Your procedure is scheduled on: Wednesday Oct. 5, 2016 Report to Same Day Surgery. To find out your arrival time please call [phone] between 1PM - 3PM on Tuesday Oct. 4, 2016.Marland Kitchen  Remember: Instructions that are not followed completely may result in serious medical risk, up to and including death, or upon the discretion of your surgeon and anesthesiologist your surgery may need to be rescheduled.    _x___ 1. Do not eat food or drink liquids after midnight. No gum chewing or hard candies.     ____ 2. No Alcohol for 24 hours before or after surgery.   ____ 3. Bring all medications with you on the day of surgery if instructed.    _x__ 4. Notify your doctor if there is any change in your medical condition     (cold, fever, infections).     Do not wear jewelry, make-up, hairpins, clips or nail polish.  Do not wear lotions, powders, or perfumes. You may wear deodorant.  Do not shave 48 hours prior to surgery. Men may shave face and neck.  Do not bring valuables to the hospital.    [facility] is not responsible for any belongings or valuables.               Contacts, dentures or bridgework may not be worn into surgery.  Leave your suitcase in the car. After surgery it may be brought to your room.  For patients admitted to the hospital, discharge time is determined by your treatment team.   Patients discharged the day of surgery will not be allowed to drive home.    Please read over the following fact sheets that you were given:   [facility] Preparing for Surgery  __x_ Take these medicines the morning of surgery with A SIP OF WATER:    1. atorvastatin (LIPITOR)   2. buPROPion (WELLBUTRIN XL)   3. fenofibrate (TRICOR)  4.levothyroxine (SYNTHROID, LEVOTHROID)  5.pantoprazole (PROTONIX)    ____ Fleet Enema (as directed)   ____ Use CHG Soap as directed  __x__ Use inhalers on the day of surgery and bring inhaler to hospital.  __x__ Stop metformin 2 days prior to surgery on  Oct. 3, 2016    __x__ Take 1/2 of usual insulin dose the night before surgery and none on the morning of surgery.   __x__ Stop aspirin on Sept. 28, 2016.  ____ Stop Anti-inflammatories on does not apply, pt is allergic.   ____ Stop supplements until after surgery.    __x__ Bring C-Pap to the hospital.

## 2015-05-04 NOTE — OR Nursing (Signed)
Cleared by Dr Ouida Sills.

## 2015-05-11 ENCOUNTER — Encounter: Payer: Self-pay | Admitting: *Deleted

## 2015-05-11 ENCOUNTER — Ambulatory Visit: Payer: Medicare Other | Admitting: Registered Nurse

## 2015-05-11 ENCOUNTER — Ambulatory Visit
Admission: RE | Admit: 2015-05-11 | Discharge: 2015-05-11 | Disposition: A | Discharge status: Home / Self Care | Payer: Medicare Other | Source: Ambulatory Visit | Attending: Urology | Admitting: Urology

## 2015-05-11 ENCOUNTER — Encounter
Admission: RE | Disposition: A | Discharge status: Home / Self Care | Payer: Self-pay | Source: Ambulatory Visit | Attending: Urology

## 2015-05-11 DIAGNOSIS — Z800 Family history of malignant neoplasm of digestive organs: Secondary | ICD-10-CM | POA: Diagnosis not present

## 2015-05-11 DIAGNOSIS — Q6211 Congenital occlusion of ureteropelvic junction: Secondary | ICD-10-CM

## 2015-05-11 DIAGNOSIS — N183 Chronic kidney disease, stage 3 (moderate): Secondary | ICD-10-CM | POA: Diagnosis not present

## 2015-05-11 DIAGNOSIS — J45909 Unspecified asthma, uncomplicated: Secondary | ICD-10-CM | POA: Insufficient documentation

## 2015-05-11 DIAGNOSIS — E039 Hypothyroidism, unspecified: Secondary | ICD-10-CM | POA: Diagnosis not present

## 2015-05-11 DIAGNOSIS — Z833 Family history of diabetes mellitus: Secondary | ICD-10-CM | POA: Insufficient documentation

## 2015-05-11 DIAGNOSIS — E785 Hyperlipidemia, unspecified: Secondary | ICD-10-CM | POA: Insufficient documentation

## 2015-05-11 DIAGNOSIS — Z90710 Acquired absence of both cervix and uterus: Secondary | ICD-10-CM | POA: Diagnosis not present

## 2015-05-11 DIAGNOSIS — Z96653 Presence of artificial knee joint, bilateral: Secondary | ICD-10-CM | POA: Insufficient documentation

## 2015-05-11 DIAGNOSIS — M109 Gout, unspecified: Secondary | ICD-10-CM | POA: Insufficient documentation

## 2015-05-11 DIAGNOSIS — F329 Major depressive disorder, single episode, unspecified: Secondary | ICD-10-CM | POA: Insufficient documentation

## 2015-05-11 DIAGNOSIS — Z886 Allergy status to analgesic agent status: Secondary | ICD-10-CM | POA: Diagnosis not present

## 2015-05-11 DIAGNOSIS — E1122 Type 2 diabetes mellitus with diabetic chronic kidney disease: Secondary | ICD-10-CM | POA: Insufficient documentation

## 2015-05-11 DIAGNOSIS — Z803 Family history of malignant neoplasm of breast: Secondary | ICD-10-CM | POA: Insufficient documentation

## 2015-05-11 DIAGNOSIS — Z794 Long term (current) use of insulin: Secondary | ICD-10-CM | POA: Diagnosis not present

## 2015-05-11 DIAGNOSIS — E669 Obesity, unspecified: Secondary | ICD-10-CM | POA: Insufficient documentation

## 2015-05-11 DIAGNOSIS — Z7982 Long term (current) use of aspirin: Secondary | ICD-10-CM | POA: Diagnosis not present

## 2015-05-11 DIAGNOSIS — N132 Hydronephrosis with renal and ureteral calculous obstruction: Secondary | ICD-10-CM | POA: Insufficient documentation

## 2015-05-11 DIAGNOSIS — I129 Hypertensive chronic kidney disease with stage 1 through stage 4 chronic kidney disease, or unspecified chronic kidney disease: Secondary | ICD-10-CM | POA: Insufficient documentation

## 2015-05-11 DIAGNOSIS — J449 Chronic obstructive pulmonary disease, unspecified: Secondary | ICD-10-CM | POA: Insufficient documentation

## 2015-05-11 DIAGNOSIS — Z7951 Long term (current) use of inhaled steroids: Secondary | ICD-10-CM | POA: Diagnosis not present

## 2015-05-11 DIAGNOSIS — Z841 Family history of disorders of kidney and ureter: Secondary | ICD-10-CM | POA: Insufficient documentation

## 2015-05-11 DIAGNOSIS — Z79899 Other long term (current) drug therapy: Secondary | ICD-10-CM | POA: Insufficient documentation

## 2015-05-11 DIAGNOSIS — N130 Hydronephrosis with ureteropelvic junction obstruction: Secondary | ICD-10-CM | POA: Diagnosis not present

## 2015-05-11 DIAGNOSIS — N1330 Unspecified hydronephrosis: Secondary | ICD-10-CM | POA: Diagnosis present

## 2015-05-11 DIAGNOSIS — M1990 Unspecified osteoarthritis, unspecified site: Secondary | ICD-10-CM | POA: Insufficient documentation

## 2015-05-11 DIAGNOSIS — F419 Anxiety disorder, unspecified: Secondary | ICD-10-CM | POA: Insufficient documentation

## 2015-05-11 HISTORY — PX: CYSTOSCOPY W/ URETERAL STENT PLACEMENT: SHX1429

## 2015-05-11 LAB — GLUCOSE, CAPILLARY
GLUCOSE-CAPILLARY: 178 mg/dL — AB (ref 65–99)
Glucose-Capillary: 198 mg/dL — ABNORMAL HIGH (ref 65–99)

## 2015-05-11 SURGERY — CYSTOSCOPY, FLEXIBLE, WITH STENT REPLACEMENT
Anesthesia: General | Laterality: Right | Wound class: Clean Contaminated

## 2015-05-11 MED ORDER — MIDAZOLAM HCL 2 MG/2ML IJ SOLN
INTRAMUSCULAR | Status: DC | PRN
  Administered 2015-05-11: 2 mg via INTRAVENOUS

## 2015-05-11 MED ORDER — GLYCOPYRROLATE 0.2 MG/ML IJ SOLN
INTRAMUSCULAR | Status: DC | PRN
  Administered 2015-05-11: 0.2 mg via INTRAVENOUS

## 2015-05-11 MED ORDER — GENTAMICIN IN SALINE 1.6-0.9 MG/ML-% IV SOLN
80.0000 mg | Freq: Once | INTRAVENOUS | Status: AC
  Administered 2015-05-11: 80 mg via INTRAVENOUS
  Filled 2015-05-11: qty 50

## 2015-05-11 MED ORDER — FENTANYL CITRATE (PF) 100 MCG/2ML IJ SOLN
INTRAMUSCULAR | Status: DC | PRN
  Administered 2015-05-11 (×2): 50 ug via INTRAVENOUS

## 2015-05-11 MED ORDER — SODIUM CHLORIDE 0.9 % IV SOLN
2.0000 g | Freq: Once | INTRAVENOUS | Status: AC
  Administered 2015-05-11: 2 g via INTRAVENOUS
  Filled 2015-05-11: qty 2000

## 2015-05-11 MED ORDER — CIPROFLOXACIN HCL 500 MG PO TABS: 500.0000 mg | ORAL_TABLET | Freq: Two times a day (BID) | ORAL | Status: DC

## 2015-05-11 MED ORDER — ONDANSETRON HCL 4 MG/2ML IJ SOLN: 4.0000 mg | Freq: Once | INTRAMUSCULAR | Status: DC | PRN

## 2015-05-11 MED ORDER — SODIUM CHLORIDE 0.9 % IR SOLN
Status: DC | PRN
  Administered 2015-05-11: 600 mL

## 2015-05-11 MED ORDER — HYDROCODONE-ACETAMINOPHEN 5-325 MG PO TABS: 1.0000 | ORAL_TABLET | ORAL | Status: DC | PRN

## 2015-05-11 MED ORDER — FENTANYL CITRATE (PF) 100 MCG/2ML IJ SOLN: 25.0000 ug | INTRAMUSCULAR | Status: DC | PRN

## 2015-05-11 MED ORDER — PROPOFOL 10 MG/ML IV BOLUS
INTRAVENOUS | Status: DC | PRN
  Administered 2015-05-11: 150 mg via INTRAVENOUS

## 2015-05-11 MED ORDER — ONDANSETRON HCL 4 MG/2ML IJ SOLN
INTRAMUSCULAR | Status: DC | PRN
  Administered 2015-05-11: 4 mg via INTRAVENOUS

## 2015-05-11 MED ORDER — SODIUM CHLORIDE 0.9 % IV SOLN
INTRAVENOUS | Status: DC
  Administered 2015-05-11: 10:00:00 via INTRAVENOUS

## 2015-05-11 MED ORDER — KETOROLAC TROMETHAMINE 30 MG/ML IJ SOLN
INTRAMUSCULAR | Status: DC | PRN
  Administered 2015-05-11: 30 mg via INTRAVENOUS

## 2015-05-11 MED ORDER — PHENYLEPHRINE HCL 10 MG/ML IJ SOLN
INTRAMUSCULAR | Status: DC | PRN
  Administered 2015-05-11: 200 ug via INTRAVENOUS

## 2015-05-11 SURGICAL SUPPLY — 14 items
BACTOSHIELD CHG 4% 4OZ (MISCELLANEOUS) ×2
GLOVE BIO SURGEON STRL SZ7 (GLOVE) ×6 IMPLANT
GLOVE BIO SURGEON STRL SZ7.5 (GLOVE) ×3 IMPLANT
GOWN L4 LG 24 PK N/S (GOWN DISPOSABLE) ×3 IMPLANT
GOWN STRL REUS W/TWL XL LVL3 (GOWN DISPOSABLE) ×3 IMPLANT
KIT RM TURNOVER CYSTO AR (KITS) ×3 IMPLANT
PACK CYSTO AR (MISCELLANEOUS) ×3 IMPLANT
SCRUB CHG 4% DYNA-HEX 4OZ (MISCELLANEOUS) ×1 IMPLANT
SENSORWIRE 0.038 NOT ANGLED (WIRE) ×3
STENT URET 6FRX24 CONTOUR (STENTS) IMPLANT
STENT URET 6FRX26 CONTOUR (STENTS) IMPLANT
STENT URETERAL METAL 6X24 (Stent) ×3 IMPLANT
SURGILUBE 2OZ TUBE FLIPTOP (MISCELLANEOUS) ×3 IMPLANT
WIRE SENSOR 0.038 NOT ANGLED (WIRE) ×1 IMPLANT

## 2015-05-11 NOTE — H&P (View-Only) (Signed)
04/21/2015 9:26 AM   Lori Blanchard [birth year] [id number]  Referring provider: No referring provider defined for this encounter.  Chief Complaint  Patient presents with  . 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 He presents today to discuss planning and preop for her stent exchange. She denies any changes in her past medical history. She recently saw her pulmonologist Dr. Raul Del and was told her pulmonary function was slightly better than her baseline.  She is complaining of some stent discomfort today.  Last stent exchange 10/25/14 by Dr. Elnoria Howard for ureteral stricture and right hydronephrosis.   PMH: Past Medical History  Diagnosis Date  . Hyperlipemia   . Hypertension   . Asthma   . Osteoarthritis   . Gout   . Osteoarthritis   . Hypothyroidism   . Anxiety and depression   . Diabetes   . Hydronephrosis   . Retroperitoneal fibrosis   . Pulmonary nodule     Surgical History: Past Surgical History  Procedure Laterality Date  . Laparoscopic hysterectomy    . Carpal tunnel release    . Breast surgery    . Replacement total knee Bilateral   . Cesarean section      Home Medications:    Medication List       This list is accurate as of: 04/21/15 11:59 PM.  Always use your most recent med list.               albuterol (2.5 MG/3ML) 0.083% nebulizer solution  Commonly known as:  PROVENTIL  Inhale into the lungs.     PROAIR HFA 108 (90 BASE) MCG/ACT inhaler  Generic drug:  albuterol  USE 2 PUFFS EVERY FOUR HOURS AS NEEDED.     alendronate 70 MG tablet  Commonly known as:  FOSAMAX  TAKE ONE TABLET EVERY WEEK. TAKE WITH A FUUL GLASS OF WATER AND DO NOT LIE DOWN FOR THE NEXT 30 MINUTES.     aspirin EC 81 MG tablet  Take by mouth.     atorvastatin 40 MG tablet  Commonly known  as:  LIPITOR  Take by mouth.     buPROPion 150 MG 24 hr tablet  Commonly known as:  WELLBUTRIN XL  Take by mouth.     fenofibrate 145 MG tablet  Commonly known as:  TRICOR  TAKE ONE (1) TABLET EACH DAY     furosemide 20 MG tablet  Commonly known as:  LASIX  Take by mouth.     insulin lispro 100 UNIT/ML KiwkPen  Commonly known as:  HUMALOG  Inject into the skin.     LANTUS SOLOSTAR 100 UNIT/ML Solostar Pen  Generic drug:  Insulin Glargine     levothyroxine 50 MCG tablet  Commonly known as:  SYNTHROID, LEVOTHROID  TAKE 1 TABLET ON AN EMPTY STOMACH AT LEAST 30-60 MINUTES BEFORE BREAKFAST     metFORMIN 1000 MG tablet  Commonly known as:  GLUCOPHAGE  TAKE 1 TABLET BY MOUTH IN THE MORNING AND 1 AND 1/2 TABLETS AT SUPPER     pantoprazole 40 MG tablet  Commonly known as:  PROTONIX  Take by mouth.     PEN NEEDLES 31GX5/16" 31G X 8 MM Misc  Inject into the skin.     SURE COMFORT PEN NEEDLES 31G X 5 MM Misc  Generic drug:  Insulin  Pen Needle  USE 4 TIMES A DAY OR AS DIRECTED BY DOCTOR     RA VITAMIN B-12 TR 1000 MCG Tbcr  Generic drug:  Cyanocobalamin  Take by mouth.     senna 8.6 MG tablet  Commonly known as:  SENOKOT  Take by mouth.     telmisartan 40 MG tablet  Commonly known as:  MICARDIS  TAKE ONE (1) TABLET EACH DAY     VICTOZA 18 MG/3ML Sopn  Generic drug:  Liraglutide  INJECT 1.8 MG SUBCUTANEOUSLY ONCE DAILY     vitamin E 1000 UNIT capsule  Take by mouth.        Allergies:  Allergies  Allergen Reactions  . Ibuprofen Itching, Nausea Only and Other (See Comments)    Family History: Family History  Problem Relation Age of Onset  . Liver cancer Mother   . Colon cancer Mother   . Breast cancer Mother   . Diabetes Daughter   . Kidney disease Daughter     adrenal tumors  . Kidney cancer Neg Hx   . Prostate cancer Neg Hx     Social History:  reports that she has never smoked. She does not have any smokeless tobacco history on file. She reports  that she does not drink alcohol or use illicit drugs.  ROS: UROLOGY Frequent Urination?: Yes Hard to postpone urination?: Yes Burning/pain with urination?: Yes Get up at night to urinate?: Yes Leakage of urine?: Yes Urine stream starts and stops?: No Trouble starting stream?: No Do you have to strain to urinate?: No Blood in urine?: No Urinary tract infection?: Yes Sexually transmitted disease?: No Injury to kidneys or bladder?: No Painful intercourse?: Yes Weak stream?: Yes Currently pregnant?: No Vaginal bleeding?: No Last menstrual period?: n  Gastrointestinal Nausea?: No Vomiting?: No Indigestion/heartburn?: Yes Diarrhea?: No Constipation?: Yes  Constitutional Fever: No Night sweats?: No Weight loss?: No Fatigue?: No  Skin Skin rash/lesions?: No Itching?: No  Eyes Blurred vision?: No Double vision?: No  Ears/Nose/Throat Sore throat?: No Sinus problems?: No  Hematologic/Lymphatic Swollen glands?: No Easy bruising?: No  Cardiovascular Leg swelling?: Yes Chest pain?: No  Respiratory Cough?: Yes Shortness of breath?: Yes  Endocrine Excessive thirst?: No  Musculoskeletal Back pain?: No Joint pain?: No  Neurological Headaches?: No Dizziness?: No  Psychologic Depression?: Yes Anxiety?: Yes  Physical Exam: BP 151/84 mmHg  Pulse 97  Ht 4\' 8"  (1.422 m)  Wt 160 lb 3.2 oz (72.666 kg)  BMI 35.94 kg/m2  Constitutional:  Alert and oriented, No acute distress. HEENT: [place] AT, moist mucus membranes.  Trachea midline, no masses. Cardiovascular: No clubbing, cyanosis, or edema, RRR Respiratory: Normal respiratory effort, no increased work of breathing, slightly decreased bu clear breath sounds GI: Abdomen is soft, nontender, nondistended, no abdominal masses GU: No CVA tenderness.  Skin: No rashes, bruises or suspicious lesions. Lymph: No cervical or inguinal adenopathy. Neurologic: Grossly intact, no focal deficits, moving all 4  extremities. Psychiatric: Normal mood and affect.  Laboratory Data:   Urinalysis    Component Value Date/Time   COLORURINE Yellow 08/08/2011 1106   APPEARANCEUR Cloudy 08/08/2011 1106   LABSPEC 1.015 08/08/2011 1106   PHURINE 7.0 08/08/2011 1106   GLUCOSEU Negative 04/21/2015 1614   GLUCOSEU Negative 08/08/2011 1106   HGBUR Negative 08/08/2011 1106   BILIRUBINUR Negative 04/21/2015 1614   BILIRUBINUR Negative 08/08/2011 1106   KETONESUR Negative 08/08/2011 1106   PROTEINUR Negative 08/08/2011 1106   NITRITE Negative 04/21/2015 1614   NITRITE Negative 08/08/2011 1106  LEUKOCYTESUR Trace* 04/21/2015 1614   LEUKOCYTESUR Negative 08/08/2011 1106    Pertinent Imaging:   Assessment & Plan:  69 year old female with significant comorbidities and 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  1. Right ureteral stricture with right hydronephrosis- patient is not a good candidate for more extensive surgery to repair her right ureteral stricture. She has done well with chronic stent placement. We will schedule patient for stent exchange with Dr. Pilar Jarvis. Urine culture ordered today. Patient will need a pulmonary clearance from her pulmonologist Dr. Raul Del prior to surgery.  2. Ureteral stent retained- - Urinalysis, Complete  No Follow-up on file.  These notes generated with voice recognition software. I apologize for typographical errors.  Herbert Moors, [facility] [address] [place], [place] [address] [phone]

## 2015-05-11 NOTE — Anesthesia Preprocedure Evaluation (Signed)
Anesthesia Evaluation  Patient identified by MRN, date of birth, ID band Patient awake    Reviewed: Allergy & Precautions, H&P , NPO status , Patient's Chart, lab work & pertinent test results, reviewed documented beta blocker date and time   History of Anesthesia Complications Negative for: history of anesthetic complications  Airway Mallampati: III  TM Distance: >3 FB Neck ROM: full    Dental no notable dental hx. (+) Poor Dentition, Missing   Pulmonary shortness of breath and with exertion, asthma , sleep apnea and Continuous Positive Airway Pressure Ventilation , COPD,  COPD inhaler, neg recent URI,    Pulmonary exam normal breath sounds clear to auscultation       Cardiovascular Exercise Tolerance: Good hypertension, On Medications (-) angina(-) CAD, (-) Past MI, (-) Cardiac Stents and (-) CABG Normal cardiovascular exam(-) dysrhythmias (-) Valvular Problems/Murmurs Rhythm:regular Rate:Normal     Neuro/Psych PSYCHIATRIC DISORDERS (depression and anxiety) negative neurological ROS     GI/Hepatic GERD  Medicated and Controlled,Fatty liver disease   Endo/Other  diabetesHypothyroidism Morbid obesity  Renal/GU CRFRenal disease  negative genitourinary   Musculoskeletal   Abdominal   Peds  Hematology negative hematology ROS (+)   Anesthesia Other Findings Past Medical History:   Hyperlipemia                                                 Hypertension                                                 Asthma                                                       Osteoarthritis                                               Gout                                                         Osteoarthritis                                               Hypothyroidism                                               Anxiety and depression  Diabetes ([place])                                                Hydronephrosis                                               Retroperitoneal fibrosis                                     Pulmonary nodule                                             Family history of adverse reaction to anesthes*                Comment:daughter has breathing problems after               anesthesia   Reproductive/Obstetrics negative OB ROS                             Anesthesia Physical Anesthesia Plan  ASA: III  Anesthesia Plan: General   Post-op Pain Management:    Induction:   Airway Management Planned:   Additional Equipment:   Intra-op Plan:   Post-operative Plan:   Informed Consent: I have reviewed the patients History and Physical, chart, labs and discussed the procedure including the risks, benefits and alternatives for the proposed anesthesia with the patient or authorized representative who has indicated his/her understanding and acceptance.   Dental Advisory Given  Plan Discussed with: Anesthesiologist, CRNA and Surgeon  Anesthesia Plan Comments:         Anesthesia Quick Evaluation

## 2015-05-11 NOTE — Interval H&P Note (Signed)
History and Physical Interval Note:  05/11/2015 9:55 AM  Thornton  has presented today for surgery, with the diagnosis of RIGHT HYDRONEPHROSIS WITH URETERAL STRICTURE  The various methods of treatment have been discussed with the patient and family. After consideration of risks, benefits and other options for treatment, the patient has consented to  Procedure(s): CYSTOSCOPY WITH STENT REPLACEMENT (Right) as a surgical intervention .  The patient's history has been reviewed, patient examined, no change in status, stable for surgery.  I have reviewed the patient's chart and labs.  Questions were answered to the patient's satisfaction.    RRR Unlabored resp  [name]

## 2015-05-11 NOTE — Discharge Instructions (Signed)

## 2015-05-11 NOTE — Transfer of Care (Signed)
Immediate Anesthesia Transfer of Care Note  Patient: Lori Blanchard  Procedure(s) Performed: Procedure(s): CYSTOSCOPY WITH STENT REPLACEMENT (Right)  Patient Location: PACU  Anesthesia Type:General  Level of Consciousness: sedated  Airway & Oxygen Therapy: Patient Spontanous Breathing and Patient connected to face mask oxygen  Post-op Assessment: Report given to RN and Post -op Vital signs reviewed and stable  Post vital signs: Reviewed and stable  Last Vitals:  Filed Vitals:   05/11/15 1109  BP: 158/75  Pulse: 97  Temp: 36.4 C  Resp: 15    Complications: No apparent anesthesia complications

## 2015-05-11 NOTE — Anesthesia Procedure Notes (Signed)
Procedure Name: LMA Insertion Date/Time: 05/11/2015 10:33 AM Performed by: Doreen Salvage Pre-anesthesia Checklist: Patient identified, Patient being monitored, Timeout performed, Emergency Drugs available and Suction available Patient Re-evaluated:Patient Re-evaluated prior to inductionOxygen Delivery Method: Circle system utilized Preoxygenation: Pre-oxygenation with 100% oxygen Intubation Type: IV induction Ventilation: Mask ventilation without difficulty LMA: LMA inserted LMA Size: 3.0 Tube type: Oral Number of attempts: 1 Placement Confirmation: positive ETCO2 and breath sounds checked- equal and bilateral Tube secured with: Tape Dental Injury: Teeth and Oropharynx as per pre-operative assessment

## 2015-05-11 NOTE — Op Note (Signed)
Preoperative diagnosis: Right hydronephrosis  Postoperative diagnosis: Same  Procedure: Cystoscopy, right retrograde program with interpretation, right ureteral stent exchange 6 French by 24 cm metallic stent  Surgeon: Baruch Gouty, M.D.  Drains: 6 French by 24 cm double-J right ureteral stent metallic  Retrograde pyelogram: Right hydronephrosis with right UPJ obstruction. Correct stent placement at the end of the procedure.   Disposition: Stable to the post anesthesia care unit  Indications for procedure: The patient is 69 year old female with multiple medical comorbidities who has a right UPJ obstruction. She is managed with a indwelling ureteral stents as she is not a good surgical candidate. She gets yearly stent changes with the metallic stent.  Description of procedure: The patient was met in the preoperative area. All risks, benefits, and indications of procedure were described in great detail. Patient consented to the procedure. Preoperative antibiotics were given. Patient was taken back to the operative theater. Gen. anesthesia was induced per the anesthesia service. Patient was then placed in dorsal lithotomy position. And prepped and draped in usual sterile fashion. Timeout was called. Next a 21 [place] 30 versus scope was inserted into the patient's bladder per urethra atraumatic. The right ureteral stent was noted. It was grasped with rigid graspers. Once the level of the urethral meatus removed intact. Scope was inserted in a retrograde polygrams obtained showing a right UPJ obstruction. A sensor wire was then placed to the level of the renal pelvis under fluoroscopy. Over this the metallic stent introducer was was placed under fluoroscopy. Through the through the introducer the metallic stent was then placed. It was confirmed to be in the correct position with a curl seen in the renal pelvis on fluoroscopy and a curl seen in the urinary bladder direct visualization. At this point the  patient's bladder is emptying. The patient was then woken from anesthesia and transferred in stable condition to the postanesthesia care unit.    Plan: The patient will follow-up in one year's time for ureteral stent change.

## 2015-05-12 NOTE — Anesthesia Postprocedure Evaluation (Signed)
  Anesthesia Post-op Note  Patient: Lori Blanchard  Procedure(s) Performed: Procedure(s): CYSTOSCOPY WITH STENT REPLACEMENT (Right)  Anesthesia type:General  Patient location: PACU  Post pain: Pain level controlled  Post assessment: Post-op Vital signs reviewed, Patient's Cardiovascular Status Stable, Respiratory Function Stable, Patent Airway and No signs of Nausea or vomiting  Post vital signs: Reviewed and stable  Last Vitals:  Filed Vitals:   05/11/15 1227  BP: 126/67  Pulse: 83  Temp:   Resp: 17    Level of consciousness: awake, alert  and patient cooperative  Complications: No apparent anesthesia complications

## 2015-05-20 DIAGNOSIS — G301 Alzheimer's disease with late onset: Secondary | ICD-10-CM

## 2015-05-20 DIAGNOSIS — F0280 Dementia in other diseases classified elsewhere without behavioral disturbance: Secondary | ICD-10-CM | POA: Insufficient documentation

## 2015-08-02 ENCOUNTER — Encounter: Payer: Self-pay | Admitting: Internal Medicine

## 2015-09-01 DIAGNOSIS — F325 Major depressive disorder, single episode, in full remission: Secondary | ICD-10-CM | POA: Insufficient documentation

## 2015-10-20 ENCOUNTER — Ambulatory Visit (INDEPENDENT_AMBULATORY_CARE_PROVIDER_SITE_OTHER): Payer: Medicare Other | Admitting: Urology

## 2015-10-20 ENCOUNTER — Encounter: Payer: Self-pay | Admitting: Urology

## 2015-10-20 VITALS — BP 192/80 | HR 103 | Ht <= 58 in | Wt 164.0 lb

## 2015-10-20 DIAGNOSIS — N135 Crossing vessel and stricture of ureter without hydronephrosis: Secondary | ICD-10-CM | POA: Diagnosis not present

## 2015-10-20 DIAGNOSIS — F3341 Major depressive disorder, recurrent, in partial remission: Secondary | ICD-10-CM | POA: Insufficient documentation

## 2015-10-20 LAB — URINALYSIS, COMPLETE
BILIRUBIN UA: NEGATIVE
GLUCOSE, UA: NEGATIVE
Ketones, UA: NEGATIVE
Leukocytes, UA: NEGATIVE
NITRITE UA: NEGATIVE
PH UA: 7 (ref 5.0–7.5)
PROTEIN UA: NEGATIVE
RBC UA: NEGATIVE
Specific Gravity, UA: 1.015 (ref 1.005–1.030)
UUROB: 1 mg/dL (ref 0.2–1.0)

## 2015-10-20 LAB — MICROSCOPIC EXAMINATION: Bacteria, UA: NONE SEEN

## 2015-10-20 NOTE — Progress Notes (Signed)
10/20/2015 3:22 PM   Lori Blanchard 1946-06-07 [id number]  Referring provider: [name], MD [facility] [place], [place] [address]  Chief Complaint  Patient presents with  . Flank Pain    HPI: The patient is a 70 year old female with multiple comorbidities with a right UPJ obstruction managed with a metallic stent. This was last changed in October 2016. She was doing well until a few weels ago when she started experiencing right flank pain that feels like her pain when she does not have a stent. She is been treated for multiple antibiotics with no help in her symptoms. She feels that she needs a stent exchange.   PMH: Past Medical History  Diagnosis Date  . Hyperlipemia   . Hypertension   . Asthma   . Osteoarthritis   . Gout   . Osteoarthritis   . Hypothyroidism   . Anxiety and depression   . Diabetes ([place])   . Hydronephrosis   . Retroperitoneal fibrosis   . Pulmonary nodule   . Family history of adverse reaction to anesthesia     daughter has breathing problems after anesthesia  . Alzheimer disease     Surgical History: Past Surgical History  Procedure Laterality Date  . Laparoscopic hysterectomy    . Carpal tunnel release    . Replacement total knee Bilateral   . Cesarean section      x 3  . Breast surgery Bilateral 1990    Reduction  . Abdominal hysterectomy      partial  . Joint replacement Left 2013    TKR  . Joint replacement Right 2011    TKR  . Hernia repair      umbilical  . Cystoscopy w/ ureteral stent placement Right 05/11/2015    Procedure: CYSTOSCOPY WITH STENT REPLACEMENT;  Surgeon: [name], MD;  Location: [facility] ORS;  Service: Urology;  Laterality: Right;    Home Medications:    Medication List       This list is accurate as of: 10/20/15  3:22 PM.  Always use your most recent med list.               albuterol (2.5 MG/3ML) 0.083% nebulizer solution  Commonly known as:  PROVENTIL    Inhale into the lungs.     PROAIR HFA 108 (90 Base) MCG/ACT inhaler  Generic drug:  albuterol  USE 2 PUFFS EVERY FOUR HOURS AS NEEDED.     alendronate 70 MG tablet  Commonly known as:  FOSAMAX  TAKE ONE TABLET EVERY WEEK. TAKE WITH A FUUL GLASS OF WATER AND DO NOT LIE DOWN FOR THE NEXT 30 MINUTES.  usually on Friday or Saturday.     aspirin EC 81 MG tablet  Take by mouth every morning.     atorvastatin 40 MG tablet  Commonly known as:  LIPITOR  Take by mouth every morning.     buPROPion 150 MG 24 hr tablet  Commonly known as:  WELLBUTRIN XL  Take by mouth every morning.     donepezil 5 MG tablet  Commonly known as:  ARICEPT     fenofibrate 145 MG tablet  Commonly known as:  TRICOR  TAKE ONE (1) TABLET EACH DAY in am     furosemide 20 MG tablet  Commonly known as:  LASIX  Take 20 mg by mouth every morning.     insulin lispro 100 UNIT/ML KiwkPen  Commonly known as:  HUMALOG  Inject 10 Units into the skin 3 (three) times daily.     LANTUS SOLOSTAR 100 UNIT/ML Solostar Pen  Generic drug:  Insulin Glargine  Inject 20 Units into the skin daily at 10 pm.     levothyroxine 50 MCG tablet  Commonly known as:  SYNTHROID, LEVOTHROID  TAKE 1 TABLET ON AN EMPTY STOMACH AT LEAST 30-60 MINUTES BEFORE BREAKFAST     metFORMIN 1000 MG tablet  Commonly known as:  GLUCOPHAGE  twice a day     pantoprazole 40 MG tablet  Commonly known as:  PROTONIX  Take by mouth every morning.     PEN NEEDLES 31GX5/16" 31G X 8 MM Misc  Inject into the skin.     SURE COMFORT PEN NEEDLES 31G X 5 MM Misc  Generic drug:  Insulin Pen Needle  USE 4 TIMES A DAY OR AS DIRECTED BY DOCTOR     RA VITAMIN B-12 TR 1000 MCG Tbcr  Generic drug:  Cyanocobalamin  Take by mouth.     VITAMIN B 12 PO  Take 50 mcg by mouth every morning.     senna 8.6 MG tablet  Commonly known as:  SENOKOT  Take by mouth 2 (two) times daily. At 8 am and 5 pm     telmisartan 40 MG tablet  Commonly known as:  MICARDIS   TAKE ONE (1) TABLET EACH DAY in am     vitamin E 1000 UNIT capsule  Take by mouth.     vitamin E 400 UNIT capsule  Take 400 Units by mouth every morning.        Allergies:  Allergies  Allergen Reactions  . Ibuprofen Itching, Nausea Only and Other (See Comments)    Family History: Family History  Problem Relation Age of Onset  . Liver cancer Mother   . Colon cancer Mother   . Breast cancer Mother   . Diabetes Daughter   . Kidney disease Daughter     adrenal tumors  . Kidney cancer Neg Hx   . Prostate cancer Neg Hx     Social History:  reports that she has never smoked. She does not have any smokeless tobacco history on file. She reports that she does not drink alcohol or use illicit drugs.  ROS: UROLOGY Frequent Urination?: Yes Hard to postpone urination?: Yes Burning/pain with urination?: Yes Get up at night to urinate?: Yes Leakage of urine?: Yes Urine stream starts and stops?: No Trouble starting stream?: No Do you have to strain to urinate?: No Blood in urine?: No Urinary tract infection?: No Sexually transmitted disease?: No Injury to kidneys or bladder?: No Painful intercourse?: No Weak stream?: No Currently pregnant?: No Vaginal bleeding?: No Last menstrual period?: n  Gastrointestinal Nausea?: Yes Vomiting?: No Indigestion/heartburn?: No Diarrhea?: No Constipation?: No  Constitutional Fever: No Night sweats?: No Weight loss?: No Fatigue?: Yes  Skin Skin rash/lesions?: No Itching?: No  Eyes Blurred vision?: Yes Double vision?: No  Ears/Nose/Throat Sore throat?: No Sinus problems?: No  Hematologic/Lymphatic Swollen glands?: No Easy bruising?: No  Cardiovascular Leg swelling?: No Chest pain?: No  Respiratory Cough?: Yes Shortness of breath?: Yes  Endocrine Excessive thirst?: No  Musculoskeletal Back pain?: No Joint pain?: Yes  Neurological Headaches?: Yes Dizziness?: No  Psychologic Depression?: Yes Anxiety?:  Yes  Physical Exam: BP 192/80 mmHg  Pulse 103  Ht 4\' 8"  (1.422 m)  Wt 164 lb (74.39 kg)  BMI 36.79 kg/m2  Constitutional:  Alert and oriented, No acute distress. HEENT: [place] AT,  moist mucus membranes.  Trachea midline, no masses. Cardiovascular: No clubbing, cyanosis, or edema. Respiratory: Normal respiratory effort, no increased work of breathing. GI: Abdomen is soft, nontender, nondistended, no abdominal masses GU: No CVA tenderness. Right CVA tenderness palpation. Consistent with obstructed stent Skin: No rashes, bruises or suspicious lesions. Lymph: No cervical or inguinal adenopathy. Neurologic: Grossly intact, no focal deficits, moving all 4 extremities. Psychiatric: Normal mood and affect.  Laboratory Data: Lab Results  Component Value Date   WBC 8.6 05/03/2015   HGB 10.1* 05/03/2015   HCT 32.0* 05/03/2015   MCV 73.4* 05/03/2015   PLT 303 05/03/2015    Lab Results  Component Value Date   CREATININE 0.83 05/03/2015    No results found for: PSA  No results found for: TESTOSTERONE  No results found for: HGBA1C  Urinalysis    Component Value Date/Time   COLORURINE Yellow 08/08/2011 1106   APPEARANCEUR Clear 04/21/2015 1614   APPEARANCEUR Cloudy 08/08/2011 1106   LABSPEC 1.015 08/08/2011 1106   PHURINE 7.0 08/08/2011 1106   GLUCOSEU Negative 04/21/2015 1614   GLUCOSEU Negative 08/08/2011 1106   HGBUR Negative 08/08/2011 1106   BILIRUBINUR Negative 04/21/2015 1614   BILIRUBINUR Negative 08/08/2011 1106   KETONESUR Negative 08/08/2011 1106   PROTEINUR Negative 04/21/2015 1614   PROTEINUR Negative 08/08/2011 1106   NITRITE Negative 04/21/2015 1614   NITRITE Negative 08/08/2011 1106   LEUKOCYTESUR Trace* 04/21/2015 1614   LEUKOCYTESUR Negative 08/08/2011 1106    Assessment & Plan:   1. Right UPJ stricture The patient has failed a right ureteral stent. It was placed approximate 6 months silk, which is an unusual timeframe she needs a stent exchange. She  does currently have a metallic stent, however we will try to use a normal ureteral stent at her next stent exchange as she has not tolerated the metal stent well since her last exchange.   No Follow-up on file.  [name], MD  [facility] [address], [place], [place] [address] [phone]

## 2015-10-21 ENCOUNTER — Telehealth: Payer: Self-pay | Admitting: Radiology

## 2015-10-21 NOTE — Telephone Encounter (Signed)
Notified pt of surgery scheduled 11/02/15, pre-admit testing appt on 3/20 @ 9:45 and to call day prior to surgery for arrival time to SDS.  Notified pt of appt with Dr Raul Del on 10/28/15 @11 :75 for Pulmonary surgical clearance. Advised pt that she may continue ASA 81mg  per Dr Carlynn Purl instruction until the day prior to surgery. Pt voices understanding.

## 2015-10-21 NOTE — Telephone Encounter (Signed)
LMOM. Need to notify pt of surgery information. 

## 2015-10-24 ENCOUNTER — Encounter
Admission: RE | Admit: 2015-10-24 | Discharge: 2015-10-24 | Disposition: A | Discharge status: Home / Self Care | Payer: Medicare Other | Source: Ambulatory Visit | Attending: Urology | Admitting: Urology

## 2015-10-24 DIAGNOSIS — Z01812 Encounter for preprocedural laboratory examination: Secondary | ICD-10-CM | POA: Insufficient documentation

## 2015-10-24 HISTORY — DX: Sleep apnea, unspecified: G47.30

## 2015-10-24 LAB — COMPREHENSIVE METABOLIC PANEL
ALBUMIN: 4 g/dL (ref 3.5–5.0)
ALK PHOS: 60 U/L (ref 38–126)
ALT: 24 U/L (ref 14–54)
AST: 29 U/L (ref 15–41)
Anion gap: 8 (ref 5–15)
BUN: 28 mg/dL — ABNORMAL HIGH (ref 6–20)
CALCIUM: 9.3 mg/dL (ref 8.9–10.3)
CO2: 23 mmol/L (ref 22–32)
CREATININE: 0.89 mg/dL (ref 0.44–1.00)
Chloride: 105 mmol/L (ref 101–111)
GFR calc Af Amer: >60 mL/min (ref >60)
GFR calc non Af Amer: >60 mL/min (ref >60)
GLUCOSE: 250 mg/dL — AB (ref 65–99)
Potassium: 4 mmol/L (ref 3.5–5.1)
SODIUM: 136 mmol/L (ref 135–145)
Total Bilirubin: 0.6 mg/dL (ref 0.3–1.2)
Total Protein: 7.3 g/dL (ref 6.5–8.1)

## 2015-10-24 LAB — CBC
HEMATOCRIT: 34.3 % — AB (ref 35.0–47.0)
Hemoglobin: 11 g/dL — ABNORMAL LOW (ref 12.0–16.0)
MCH: 23.7 pg — AB (ref 26.0–34.0)
MCHC: 32.1 g/dL (ref 32.0–36.0)
MCV: 73.8 fL — AB (ref 80.0–100.0)
PLATELETS: 347 10*3/uL (ref 150–440)
RBC: 4.65 MIL/uL (ref 3.80–5.20)
RDW: 15.5 % — AB (ref 11.5–14.5)
WBC: 10.9 10*3/uL (ref 3.6–11.0)

## 2015-10-24 NOTE — Pre-Procedure Instructions (Signed)
Dr. Carlynn Purl office notified per fax that a request was sent to Dr. Gust Brooms office for pulmonary clearance.

## 2015-10-24 NOTE — Pre-Procedure Instructions (Signed)
Dr. Vella Kohler will be seeing the patient on 3/24 for regular appointment.  Faxed a request for clearance to be completed at that visit.  Caryl Pina verified that they received report.

## 2015-10-24 NOTE — Patient Instructions (Signed)
  Your procedure is scheduled on:11/02/15 Report to Day Surgery. To find out your arrival time please call [phone] between 1PM - 3PM on 11/01/15.  Remember: Instructions that are not followed completely may result in serious medical risk, up to and including death, or upon the discretion of your surgeon and anesthesiologist your surgery may need to be rescheduled.    __x__ 1. Do not eat food or drink liquids after midnight. No gum chewing or hard candies.     ___x_ 2. No Alcohol for 24 hours before or after surgery.   ____ 3. Bring all medications with you on the day of surgery if instructed.    __x__ 4. Notify your doctor if there is any change in your medical condition     (cold, fever, infections).     Do not wear jewelry, make-up, hairpins, clips or nail polish.  Do not wear lotions, powders, or perfumes. You may wear deodorant.  Do not shave 48 hours prior to surgery. Men may shave face and neck.  Do not bring valuables to the hospital.    [facility] is not responsible for any belongings or valuables.               Contacts, dentures or bridgework may not be worn into surgery.  Leave your suitcase in the car. After surgery it may be brought to your room.  For patients admitted to the hospital, discharge time is determined by your                treatment team.   Patients discharged the day of surgery will not be allowed to drive home.   Please read over the following fact sheets that you were given:   Surgical Site Infection Prevention   __x__ Take these medicines the morning of surgery with A SIP OF WATER:    1. Pantoprazole (Protonix)  2. telmisartan (Micardis)  3. Levothyroxine (Synthroid)  4.fenofibrate( Tricor)  5. atorvastin ( Lipitor)  6.Bupropion (Wellbutrin)  ____ Fleet Enema (as directed)   ____ Use CHG Soap as directed  _x___ Use inhalers on the day of surgery  _x___ Stop metformin 2 days prior to surgery ( Last dose on Sunday 10/30/15)    __x__  Take 1/2 of usual insulin dose the night before surgery and none on the morning of surgery.   __x__ Stop aspirin on Last Dose on Tues. 10/25/15  ____ Stop Anti-inflammatories on    __x__ Stop supplements until after surgery. ( Vit E) last dose 3/21   __x__ Bring C-Pap to the hospital.

## 2015-10-26 LAB — URINE CULTURE
CULTURE: NO GROWTH
Special Requests: NORMAL

## 2015-11-01 NOTE — Pre-Procedure Instructions (Addendum)
CLEARED BY DR Raul Del 10/28/15

## 2015-11-02 ENCOUNTER — Ambulatory Visit
Admission: RE | Admit: 2015-11-02 | Discharge: 2015-11-02 | Disposition: A | Discharge status: Home / Self Care | Payer: Medicare Other | Source: Ambulatory Visit | Attending: Urology | Admitting: Urology

## 2015-11-02 ENCOUNTER — Encounter
Admission: RE | Disposition: A | Discharge status: Home / Self Care | Payer: Self-pay | Source: Ambulatory Visit | Attending: Urology

## 2015-11-02 ENCOUNTER — Ambulatory Visit: Payer: Medicare Other | Admitting: Anesthesiology

## 2015-11-02 ENCOUNTER — Encounter: Payer: Self-pay | Admitting: *Deleted

## 2015-11-02 DIAGNOSIS — F0280 Dementia in other diseases classified elsewhere without behavioral disturbance: Secondary | ICD-10-CM | POA: Insufficient documentation

## 2015-11-02 DIAGNOSIS — Z886 Allergy status to analgesic agent status: Secondary | ICD-10-CM | POA: Insufficient documentation

## 2015-11-02 DIAGNOSIS — N135 Crossing vessel and stricture of ureter without hydronephrosis: Secondary | ICD-10-CM

## 2015-11-02 DIAGNOSIS — Z96653 Presence of artificial knee joint, bilateral: Secondary | ICD-10-CM | POA: Diagnosis not present

## 2015-11-02 DIAGNOSIS — Z841 Family history of disorders of kidney and ureter: Secondary | ICD-10-CM | POA: Insufficient documentation

## 2015-11-02 DIAGNOSIS — Z803 Family history of malignant neoplasm of breast: Secondary | ICD-10-CM | POA: Insufficient documentation

## 2015-11-02 DIAGNOSIS — J45909 Unspecified asthma, uncomplicated: Secondary | ICD-10-CM | POA: Diagnosis not present

## 2015-11-02 DIAGNOSIS — I10 Essential (primary) hypertension: Secondary | ICD-10-CM | POA: Insufficient documentation

## 2015-11-02 DIAGNOSIS — G309 Alzheimer's disease, unspecified: Secondary | ICD-10-CM | POA: Diagnosis not present

## 2015-11-02 DIAGNOSIS — Z7982 Long term (current) use of aspirin: Secondary | ICD-10-CM | POA: Insufficient documentation

## 2015-11-02 DIAGNOSIS — N130 Hydronephrosis with ureteropelvic junction obstruction: Secondary | ICD-10-CM | POA: Diagnosis not present

## 2015-11-02 DIAGNOSIS — M1990 Unspecified osteoarthritis, unspecified site: Secondary | ICD-10-CM | POA: Insufficient documentation

## 2015-11-02 DIAGNOSIS — Z7951 Long term (current) use of inhaled steroids: Secondary | ICD-10-CM | POA: Diagnosis not present

## 2015-11-02 DIAGNOSIS — M109 Gout, unspecified: Secondary | ICD-10-CM | POA: Insufficient documentation

## 2015-11-02 DIAGNOSIS — F419 Anxiety disorder, unspecified: Secondary | ICD-10-CM | POA: Diagnosis not present

## 2015-11-02 DIAGNOSIS — R109 Unspecified abdominal pain: Secondary | ICD-10-CM | POA: Diagnosis not present

## 2015-11-02 DIAGNOSIS — Z833 Family history of diabetes mellitus: Secondary | ICD-10-CM | POA: Diagnosis not present

## 2015-11-02 DIAGNOSIS — E039 Hypothyroidism, unspecified: Secondary | ICD-10-CM | POA: Insufficient documentation

## 2015-11-02 DIAGNOSIS — F329 Major depressive disorder, single episode, unspecified: Secondary | ICD-10-CM | POA: Insufficient documentation

## 2015-11-02 DIAGNOSIS — Z79899 Other long term (current) drug therapy: Secondary | ICD-10-CM | POA: Insufficient documentation

## 2015-11-02 DIAGNOSIS — Z794 Long term (current) use of insulin: Secondary | ICD-10-CM | POA: Diagnosis not present

## 2015-11-02 DIAGNOSIS — E119 Type 2 diabetes mellitus without complications: Secondary | ICD-10-CM | POA: Diagnosis not present

## 2015-11-02 DIAGNOSIS — Z800 Family history of malignant neoplasm of digestive organs: Secondary | ICD-10-CM | POA: Diagnosis not present

## 2015-11-02 DIAGNOSIS — Z90710 Acquired absence of both cervix and uterus: Secondary | ICD-10-CM | POA: Diagnosis not present

## 2015-11-02 DIAGNOSIS — E785 Hyperlipidemia, unspecified: Secondary | ICD-10-CM | POA: Diagnosis not present

## 2015-11-02 HISTORY — PX: CYSTOSCOPY W/ RETROGRADES: SHX1426

## 2015-11-02 LAB — GLUCOSE, CAPILLARY
GLUCOSE-CAPILLARY: 220 mg/dL — AB (ref 65–99)
Glucose-Capillary: 227 mg/dL — ABNORMAL HIGH (ref 65–99)

## 2015-11-02 SURGERY — CYSTOSCOPY, WITH RETROGRADE PYELOGRAM
Anesthesia: General | Laterality: Right | Wound class: Clean Contaminated

## 2015-11-02 MED ORDER — CEFAZOLIN SODIUM-DEXTROSE 2-4 GM/100ML-% IV SOLN
2.0000 g | Freq: Once | INTRAVENOUS | Status: AC
  Administered 2015-11-02: 2 g via INTRAVENOUS

## 2015-11-02 MED ORDER — PROPOFOL 10 MG/ML IV BOLUS
INTRAVENOUS | Status: DC | PRN
  Administered 2015-11-02: 110 mg via INTRAVENOUS

## 2015-11-02 MED ORDER — OXYCODONE HCL 5 MG PO TABS: 5.0000 mg | ORAL_TABLET | Freq: Once | ORAL | Status: DC | PRN

## 2015-11-02 MED ORDER — FENTANYL CITRATE (PF) 100 MCG/2ML IJ SOLN
INTRAMUSCULAR | Status: DC | PRN
  Administered 2015-11-02: 50 ug via INTRAVENOUS

## 2015-11-02 MED ORDER — IOTHALAMATE MEGLUMINE 43 % IV SOLN
INTRAVENOUS | Status: DC | PRN
  Administered 2015-11-02: 10 mL

## 2015-11-02 MED ORDER — OXYCODONE HCL 5 MG/5ML PO SOLN: 5.0000 mg | Freq: Once | ORAL | Status: DC | PRN

## 2015-11-02 MED ORDER — HYDROCODONE-ACETAMINOPHEN 5-325 MG PO TABS: 1.0000 | ORAL_TABLET | Freq: Four times a day (QID) | ORAL | Status: DC | PRN

## 2015-11-02 MED ORDER — LIDOCAINE HCL (CARDIAC) 20 MG/ML IV SOLN
INTRAVENOUS | Status: DC | PRN
  Administered 2015-11-02: 100 mg via INTRAVENOUS

## 2015-11-02 MED ORDER — MIDAZOLAM HCL 2 MG/2ML IJ SOLN
INTRAMUSCULAR | Status: DC | PRN
  Administered 2015-11-02: 2 mg via INTRAVENOUS

## 2015-11-02 MED ORDER — FENTANYL CITRATE (PF) 100 MCG/2ML IJ SOLN: 25.0000 ug | INTRAMUSCULAR | Status: DC | PRN

## 2015-11-02 MED ORDER — SODIUM CHLORIDE 0.9 % IV SOLN
INTRAVENOUS | Status: DC
  Administered 2015-11-02 (×2): via INTRAVENOUS

## 2015-11-02 MED ORDER — PHENYLEPHRINE HCL 10 MG/ML IJ SOLN
INTRAMUSCULAR | Status: DC | PRN
  Administered 2015-11-02: 100 ug via INTRAVENOUS

## 2015-11-02 MED ORDER — CEPHALEXIN 500 MG PO CAPS: 500.0000 mg | ORAL_CAPSULE | Freq: Three times a day (TID) | ORAL | Status: DC

## 2015-11-02 MED ORDER — CEFAZOLIN SODIUM-DEXTROSE 2-4 GM/100ML-% IV SOLN
INTRAVENOUS | Status: AC
  Administered 2015-11-02: 2 g via INTRAVENOUS
  Filled 2015-11-02: qty 100

## 2015-11-02 MED ORDER — ONDANSETRON HCL 4 MG/2ML IJ SOLN
INTRAMUSCULAR | Status: DC | PRN
  Administered 2015-11-02: 4 mg via INTRAVENOUS

## 2015-11-02 SURGICAL SUPPLY — 31 items
BACTOSHIELD CHG 4% 4OZ (MISCELLANEOUS) ×2
BAG DRAIN CYSTO-URO LG1000N (MISCELLANEOUS) ×3 IMPLANT
BALLN NEPHROMAX 24X8X12 (CATHETERS) ×3
BALLOON NEPHROMAX 24X8X12 (CATHETERS) ×1 IMPLANT
CATH FOL 2WAY LX 16X5 (CATHETERS) ×3 IMPLANT
CATH FOLEY 2W COUNCIL 5CC 18FR (CATHETERS) ×3 IMPLANT
CATH URETL 5X70 OPEN END (CATHETERS) ×3 IMPLANT
CONRAY 43 FOR UROLOGY 50M (MISCELLANEOUS) ×3 IMPLANT
FEE TECHNICIAN ONLY PER HOUR (MISCELLANEOUS) IMPLANT
GLOVE BIO SURGEON STRL SZ7 (GLOVE) ×6 IMPLANT
GLOVE BIO SURGEON STRL SZ7.5 (GLOVE) ×3 IMPLANT
GOWN STRL REUS W/ TWL XL LVL3 (GOWN DISPOSABLE) ×2 IMPLANT
GOWN STRL REUS W/TWL XL LVL3 (GOWN DISPOSABLE) ×6
GUIDEWIRE SUPER STIFF .035X180 (WIRE) ×3 IMPLANT
INTRODUCER DILATOR DOUBLE (INTRODUCER) ×3 IMPLANT
KIT RM TURNOVER CYSTO AR (KITS) ×3 IMPLANT
LASER HOLMIUM FIBER SU 272UM (MISCELLANEOUS) IMPLANT
PACK CYSTO AR (MISCELLANEOUS) ×3 IMPLANT
SCRUB CHG 4% DYNA-HEX 4OZ (MISCELLANEOUS) ×1 IMPLANT
SENSORWIRE 0.038 NOT ANGLED (WIRE) ×3
SET CYSTO W/LG BORE CLAMP LF (SET/KITS/TRAYS/PACK) ×3 IMPLANT
SOL .9 NS 3000ML IRR  AL (IV SOLUTION) ×2
SOL .9 NS 3000ML IRR AL (IV SOLUTION) ×1
SOL .9 NS 3000ML IRR UROMATIC (IV SOLUTION) ×1 IMPLANT
STENT URET 6FRX24 CONTOUR (STENTS) ×3 IMPLANT
STENT URET 6FRX26 CONTOUR (STENTS) IMPLANT
SURGILUBE 2OZ TUBE FLIPTOP (MISCELLANEOUS) ×3 IMPLANT
SYR 30ML LL (SYRINGE) ×3 IMPLANT
SYRINGE IRR TOOMEY STRL 70CC (SYRINGE) ×3 IMPLANT
WATER STERILE IRR 1000ML POUR (IV SOLUTION) ×3 IMPLANT
WIRE SENSOR 0.038 NOT ANGLED (WIRE) ×1 IMPLANT

## 2015-11-02 NOTE — Anesthesia Procedure Notes (Signed)
Procedure Name: LMA Insertion Date/Time: 11/02/2015 7:39 AM Performed by: Silvana Newness Pre-anesthesia Checklist: Patient identified, Emergency Drugs available, Patient being monitored, Suction available and Timeout performed Patient Re-evaluated:Patient Re-evaluated prior to inductionOxygen Delivery Method: Circle system utilized Preoxygenation: Pre-oxygenation with 100% oxygen Intubation Type: IV induction Ventilation: Mask ventilation without difficulty LMA: LMA inserted LMA Size: 4.0 Number of attempts: 1 Placement Confirmation: positive ETCO2 and breath sounds checked- equal and bilateral Tube secured with: Tape Dental Injury: Teeth and Oropharynx as per pre-operative assessment

## 2015-11-02 NOTE — Op Note (Signed)
Date of procedure: 11/02/2015  Preoperative diagnosis:  1. Right symptomatic UPJ obstruction   Postoperative diagnosis:  1. Right UPJ obstruction   Procedure: 1. Cystoscopy 2. Right retrograde pyelogram with interpretation 3. Right ureteral stent exchange 6 [place] by 24 cm  Surgeon: Baruch Gouty, MD  Anesthesia: General   Complications: None  Intraoperative findings: Retrograde pyelogram on the right again revealed a right UPJ obstruction with retention of contrast. At the end of the procedure, the right ureteral stent was noted to be in the correct position on fluoroscopy. Contrast was seen draining from the right ureteral stent.  EBL: None  Specimens: None  Drains: 6 French by 24 cm right double-J ureteral stent  Disposition: Stable to the postanesthesia care unit  Indication for procedure: The patient is a 70 y.o. female with a symptomatic right UPJ obstruction who is a poor surgical candidate due to multiple medical comorbidities. She has been managed with a right ureteral metal stent. She finds this stent uncomfortable so she presents today for right ureteral stent exchange with a standard ureteral stent.  After reviewing the management options for treatment, the patient elected to proceed with the above surgical procedure(s). We have discussed the potential benefits and risks of the procedure, side effects of the proposed treatment, the likelihood of the patient achieving the goals of the procedure, and any potential problems that might occur during the procedure or recuperation. Informed consent has been obtained.  Description of procedure: The patient was met in the preoperative area. All risks, benefits, and indications of the procedure were described in great detail. The patient consented to the procedure. Preoperative antibiotics were given. The patient was taken to the operative theater. General anesthesia was induced per the anesthesia service. The patient was then  placed in the dorsal lithotomy position and prepped and draped in the usual sterile fashion. A preoperative timeout was called. A 21 French 30 cystoscope was inserted into the patient's bladder atraumatically per urethra. Right ureteral stent was noted to be in the correct position on fluoroscopy. It was grasped with flexible graspers and brought to level the urethral meatus and removed intact. A retrograde pyelogram was then obtained which showed a right UPJ obstruction with retention of contrast in the renal pelvis. A sensor wire was then placed the level of the renal pelvis under fluoroscopy. A 6 French by 24 cm double-J ureteral stent was then placed and the sensor wire was removed. Stent was confirmed to be in the correct location with a curl seen in the patient's renal pelvis on fluoroscopy and a curl seen in the patient's urinary bladder with direct visualization. There was immediate drainage of the retained contrast after stent placement. The patient's bladder was then drained and she was woken from anesthesia and transferred in stable condition to the postanesthesia care unit.  Plan: The patient will follow-up in 3 months for right ureteral stent exchange. She'll contact the office if she has any issues in the interim.  Baruch Gouty, M.D.

## 2015-11-02 NOTE — Anesthesia Preprocedure Evaluation (Signed)
Anesthesia Evaluation  Patient identified by MRN, date of birth, ID band Patient awake    Reviewed: Allergy & Precautions, H&P , NPO status , Patient's Chart, lab work & pertinent test results  History of Anesthesia Complications (+) Family history of anesthesia reaction and history of anesthetic complications  Airway Mallampati: III  TM Distance: <3 FB Neck ROM: full    Dental  (+) Poor Dentition, Chipped, Missing   Pulmonary neg pulmonary ROS, shortness of breath and with exertion, asthma , sleep apnea , COPD,    Pulmonary exam normal breath sounds clear to auscultation       Cardiovascular Exercise Tolerance: Good hypertension, (-) angina+ DOE  (-) Past MI Normal cardiovascular exam Rhythm:regular Rate:Normal     Neuro/Psych PSYCHIATRIC DISORDERS Anxiety Depression negative neurological ROS     GI/Hepatic negative GI ROS, Neg liver ROS,   Endo/Other  diabetes, Type 2, Insulin DependentHypothyroidism   Renal/GU Renal disease  negative genitourinary   Musculoskeletal  (+) Arthritis ,   Abdominal   Peds  Hematology negative hematology ROS (+)   Anesthesia Other Findings Past Medical History:   Hyperlipemia                                                 Hypertension                                                 Asthma                                                       Osteoarthritis                                               Gout                                                         Osteoarthritis                                               Hypothyroidism                                               Anxiety and depression                                       Diabetes ([place])  Hydronephrosis                                               Retroperitoneal fibrosis                                     Pulmonary nodule                                              Family history of adverse reaction to anesthes*                Comment:daughter has breathing problems after               anesthesia   Alzheimer disease                                            Sleep apnea                                                 Past Surgical History:   LAPAROSCOPIC HYSTERECTOMY                                     CARPAL TUNNEL RELEASE                                         REPLACEMENT TOTAL KNEE                          Bilateral              CESAREAN SECTION                                                Comment:x 3   BREAST SURGERY                                  Bilateral 1990           Comment:Reduction   ABDOMINAL HYSTERECTOMY                                          Comment:partial   JOINT REPLACEMENT                               Left 2013           Comment:TKR   JOINT REPLACEMENT  Right 2011           Comment:TKR   HERNIA REPAIR                                                   Comment:umbilical   CYSTOSCOPY W/ URETERAL STENT PLACEMENT          Right 05/11/2015      Comment:Procedure: CYSTOSCOPY WITH STENT REPLACEMENT;                Surgeon: [name], MD;  Location:               [facility] ORS;  Service: Urology;  Laterality:               Right;     Reproductive/Obstetrics negative OB ROS                             Anesthesia Physical Anesthesia Plan  ASA: III  Anesthesia Plan: General LMA   Post-op Pain Management:    Induction:   Airway Management Planned:   Additional Equipment:   Intra-op Plan:   Post-operative Plan:   Informed Consent: I have reviewed the patients History and Physical, chart, labs and discussed the procedure including the risks, benefits and alternatives for the proposed anesthesia with the patient or authorized representative who has indicated his/her understanding and acceptance.   Dental Advisory Given  Plan Discussed with:  Anesthesiologist, CRNA and Surgeon  Anesthesia Plan Comments:         Anesthesia Quick Evaluation

## 2015-11-02 NOTE — H&P (View-Only) (Signed)
10/20/2015 3:22 PM   Lori Blanchard [birth year] [id number]  Referring provider: [name], MD [facility] [place], [place] [address]  Chief Complaint  Patient presents with  . Flank Pain    HPI: The patient is a 70 year old female with multiple comorbidities with a right UPJ obstruction managed with a metallic stent. This was last changed in October 2016. She was doing well until a few weels ago when she started experiencing right flank pain that feels like her pain when she does not have a stent. She is been treated for multiple antibiotics with no help in her symptoms. She feels that she needs a stent exchange.   PMH: Past Medical History  Diagnosis Date  . Hyperlipemia   . Hypertension   . Asthma   . Osteoarthritis   . Gout   . Osteoarthritis   . Hypothyroidism   . Anxiety and depression   . Diabetes ([place])   . Hydronephrosis   . Retroperitoneal fibrosis   . Pulmonary nodule   . Family history of adverse reaction to anesthesia     daughter has breathing problems after anesthesia  . Alzheimer disease     Surgical History: Past Surgical History  Procedure Laterality Date  . Laparoscopic hysterectomy    . Carpal tunnel release    . Replacement total knee Bilateral   . Cesarean section      x 3  . Breast surgery Bilateral 1990    Reduction  . Abdominal hysterectomy      partial  . Joint replacement Left 2013    TKR  . Joint replacement Right 2011    TKR  . Hernia repair      umbilical  . Cystoscopy w/ ureteral stent placement Right 05/11/2015    Procedure: CYSTOSCOPY WITH STENT REPLACEMENT;  Surgeon: [name], MD;  Location: [facility] ORS;  Service: Urology;  Laterality: Right;    Home Medications:    Medication List       This list is accurate as of: 10/20/15  3:22 PM.  Always use your most recent med list.               albuterol (2.5 MG/3ML) 0.083% nebulizer solution  Commonly known as:  PROVENTIL    Inhale into the lungs.     PROAIR HFA 108 (90 Base) MCG/ACT inhaler  Generic drug:  albuterol  USE 2 PUFFS EVERY FOUR HOURS AS NEEDED.     alendronate 70 MG tablet  Commonly known as:  FOSAMAX  TAKE ONE TABLET EVERY WEEK. TAKE WITH A FUUL GLASS OF WATER AND DO NOT LIE DOWN FOR THE NEXT 30 MINUTES.  usually on Friday or Saturday.     aspirin EC 81 MG tablet  Take by mouth every morning.     atorvastatin 40 MG tablet  Commonly known as:  LIPITOR  Take by mouth every morning.     buPROPion 150 MG 24 hr tablet  Commonly known as:  WELLBUTRIN XL  Take by mouth every morning.     donepezil 5 MG tablet  Commonly known as:  ARICEPT     fenofibrate 145 MG tablet  Commonly known as:  TRICOR  TAKE ONE (1) TABLET EACH DAY in am     furosemide 20 MG tablet  Commonly known as:  LASIX  Take 20 mg by mouth every morning.     insulin lispro 100 UNIT/ML KiwkPen  Commonly known as:  HUMALOG  Inject 10 Units into the skin 3 (three) times daily.     LANTUS SOLOSTAR 100 UNIT/ML Solostar Pen  Generic drug:  Insulin Glargine  Inject 20 Units into the skin daily at 10 pm.     levothyroxine 50 MCG tablet  Commonly known as:  SYNTHROID, LEVOTHROID  TAKE 1 TABLET ON AN EMPTY STOMACH AT LEAST 30-60 MINUTES BEFORE BREAKFAST     metFORMIN 1000 MG tablet  Commonly known as:  GLUCOPHAGE  twice a day     pantoprazole 40 MG tablet  Commonly known as:  PROTONIX  Take by mouth every morning.     PEN NEEDLES 31GX5/16" 31G X 8 MM Misc  Inject into the skin.     SURE COMFORT PEN NEEDLES 31G X 5 MM Misc  Generic drug:  Insulin Pen Needle  USE 4 TIMES A DAY OR AS DIRECTED BY DOCTOR     RA VITAMIN B-12 TR 1000 MCG Tbcr  Generic drug:  Cyanocobalamin  Take by mouth.     VITAMIN B 12 PO  Take 50 mcg by mouth every morning.     senna 8.6 MG tablet  Commonly known as:  SENOKOT  Take by mouth 2 (two) times daily. At 8 am and 5 pm     telmisartan 40 MG tablet  Commonly known as:  MICARDIS   TAKE ONE (1) TABLET EACH DAY in am     vitamin E 1000 UNIT capsule  Take by mouth.     vitamin E 400 UNIT capsule  Take 400 Units by mouth every morning.        Allergies:  Allergies  Allergen Reactions  . Ibuprofen Itching, Nausea Only and Other (See Comments)    Family History: Family History  Problem Relation Age of Onset  . Liver cancer Mother   . Colon cancer Mother   . Breast cancer Mother   . Diabetes Daughter   . Kidney disease Daughter     adrenal tumors  . Kidney cancer Neg Hx   . Prostate cancer Neg Hx     Social History:  reports that she has never smoked. She does not have any smokeless tobacco history on file. She reports that she does not drink alcohol or use illicit drugs.  ROS: UROLOGY Frequent Urination?: Yes Hard to postpone urination?: Yes Burning/pain with urination?: Yes Get up at night to urinate?: Yes Leakage of urine?: Yes Urine stream starts and stops?: No Trouble starting stream?: No Do you have to strain to urinate?: No Blood in urine?: No Urinary tract infection?: No Sexually transmitted disease?: No Injury to kidneys or bladder?: No Painful intercourse?: No Weak stream?: No Currently pregnant?: No Vaginal bleeding?: No Last menstrual period?: n  Gastrointestinal Nausea?: Yes Vomiting?: No Indigestion/heartburn?: No Diarrhea?: No Constipation?: No  Constitutional Fever: No Night sweats?: No Weight loss?: No Fatigue?: Yes  Skin Skin rash/lesions?: No Itching?: No  Eyes Blurred vision?: Yes Double vision?: No  Ears/Nose/Throat Sore throat?: No Sinus problems?: No  Hematologic/Lymphatic Swollen glands?: No Easy bruising?: No  Cardiovascular Leg swelling?: No Chest pain?: No  Respiratory Cough?: Yes Shortness of breath?: Yes  Endocrine Excessive thirst?: No  Musculoskeletal Back pain?: No Joint pain?: Yes  Neurological Headaches?: Yes Dizziness?: No  Psychologic Depression?: Yes Anxiety?:  Yes  Physical Exam: BP 192/80 mmHg  Pulse 103  Ht 4\' 8"  (1.422 m)  Wt 164 lb (74.39 kg)  BMI 36.79 kg/m2  Constitutional:  Alert and oriented, No acute distress. HEENT: [place] AT,  moist mucus membranes.  Trachea midline, no masses. Cardiovascular: No clubbing, cyanosis, or edema. Respiratory: Normal respiratory effort, no increased work of breathing. GI: Abdomen is soft, nontender, nondistended, no abdominal masses GU: No CVA tenderness. Right CVA tenderness palpation. Consistent with obstructed stent Skin: No rashes, bruises or suspicious lesions. Lymph: No cervical or inguinal adenopathy. Neurologic: Grossly intact, no focal deficits, moving all 4 extremities. Psychiatric: Normal mood and affect.  Laboratory Data: Lab Results  Component Value Date   WBC 8.6 05/03/2015   HGB 10.1* 05/03/2015   HCT 32.0* 05/03/2015   MCV 73.4* 05/03/2015   PLT 303 05/03/2015    Lab Results  Component Value Date   CREATININE 0.83 05/03/2015    No results found for: PSA  No results found for: TESTOSTERONE  No results found for: HGBA1C  Urinalysis    Component Value Date/Time   COLORURINE Yellow 08/08/2011 1106   APPEARANCEUR Clear 04/21/2015 1614   APPEARANCEUR Cloudy 08/08/2011 1106   LABSPEC 1.015 08/08/2011 1106   PHURINE 7.0 08/08/2011 1106   GLUCOSEU Negative 04/21/2015 1614   GLUCOSEU Negative 08/08/2011 1106   HGBUR Negative 08/08/2011 1106   BILIRUBINUR Negative 04/21/2015 1614   BILIRUBINUR Negative 08/08/2011 1106   KETONESUR Negative 08/08/2011 1106   PROTEINUR Negative 04/21/2015 1614   PROTEINUR Negative 08/08/2011 1106   NITRITE Negative 04/21/2015 1614   NITRITE Negative 08/08/2011 1106   LEUKOCYTESUR Trace* 04/21/2015 1614   LEUKOCYTESUR Negative 08/08/2011 1106    Assessment & Plan:   1. Right UPJ stricture The patient has failed a right ureteral stent. It was placed approximate 6 months silk, which is an unusual timeframe she needs a stent exchange. She  does currently have a metallic stent, however we will try to use a normal ureteral stent at her next stent exchange as she has not tolerated the metal stent well since her last exchange.   No Follow-up on file.  [name], MD  [facility] [address] [place], [place] [address] [phone]

## 2015-11-02 NOTE — Interval H&P Note (Signed)
History and Physical Interval Note:  11/02/2015 7:24 AM  [place]  has presented today for surgery, with the diagnosis of UPJ OBSTRUCTION  The various methods of treatment have been discussed with the patient and family. After consideration of risks, benefits and other options for treatment, the patient has consented to  Procedure(s): CYSTOSCOPY WITH RETROGRADE PYELOGRAM, URETERAL STENT EXCHANGE (Right) as a surgical intervention .  The patient's history has been reviewed, patient examined, no change in status, stable for surgery.  I have reviewed the patient's chart and labs.  Questions were answered to the patient's satisfaction.    RRR Unlabored respirations   Horald Pollen [place]

## 2015-11-02 NOTE — Transfer of Care (Signed)
Immediate Anesthesia Transfer of Care Note  Patient: Lori Blanchard  Procedure(s) Performed: Procedure(s): CYSTOSCOPY WITH RETROGRADE PYELOGRAM, URETERAL STENT EXCHANGE (Right)  Patient Location: PACU  Anesthesia Type:General  Level of Consciousness: awake, alert , oriented and patient cooperative  Airway & Oxygen Therapy: Patient Spontanous Breathing and Patient connected to face mask oxygen  Post-op Assessment: Report given to RN, Post -op Vital signs reviewed and stable and Patient moving all extremities X 4  Post vital signs: Reviewed and stable  Last Vitals:  Filed Vitals:   11/02/15 0619  BP: 159/77  Pulse: 92  Temp: 36.9 C  Resp: 16    Complications: No apparent anesthesia complications

## 2015-11-02 NOTE — Discharge Instructions (Signed)
General Anesthesia, Adult °General anesthesia is a sleep-like state of non-feeling produced by medicines (anesthetics). General anesthesia prevents you from being alert and feeling pain during a medical procedure. Your caregiver may recommend general anesthesia if your procedure: °· Is long. °· Is painful or uncomfortable. °· Would be frightening to see or hear. °· Requires you to be still. °· Affects your breathing. °· Causes significant blood loss. °LET YOUR CAREGIVER KNOW ABOUT: °· Allergies to food or medicine. °· Medicines taken, including vitamins, herbs, eyedrops, over-the-counter medicines, and creams. °· Use of steroids (by mouth or creams). °· Previous problems with anesthetics or numbing medicines, including problems experienced by relatives. °· History of bleeding problems or blood clots. °· Previous surgeries and types of anesthetics received. °· Possibility of pregnancy, if this applies. °· Use of cigarettes, alcohol, or illegal drugs. °· Any health condition(s), especially diabetes, sleep apnea, and high blood pressure. °RISKS AND COMPLICATIONS °General anesthesia rarely causes complications. However, if complications do occur, they can be life threatening. Complications include: °· A lung infection. °· A stroke. °· A heart attack. °· Waking up during the procedure. When this occurs, the patient may be unable to move and communicate that he or she is awake. The patient may feel severe pain. °Older adults and adults with serious medical problems are more likely to have complications than adults who are young and healthy. Some complications can be prevented by answering all of your caregiver's questions thoroughly and by following all pre-procedure instructions. It is important to tell your caregiver if any of the pre-procedure instructions, especially those related to diet, were not followed. Any food or liquid in the stomach can cause problems when you are under general anesthesia. °BEFORE THE  PROCEDURE °· Ask your caregiver if you will have to spend the night at the hospital. If you will not have to spend the night, arrange to have an adult drive you and stay with you for 24 hours. °· Follow your caregiver's instructions if you are taking dietary supplements or medicines. Your caregiver may tell you to stop taking them or to reduce your dosage. °· Do not smoke for as long as possible before your procedure. If possible, stop smoking 3-6 weeks before the procedure. °· Do not take new dietary supplements or medicines within 1 week of your procedure unless your caregiver approves them. °· Do not eat within 8 hours of your procedure or as directed by your caregiver. Drink only clear liquids, such as water, black coffee (without milk or cream), and fruit juices (without pulp). °· Do not drink within 3 hours of your procedure or as directed by your caregiver. °· You may brush your teeth on the morning of the procedure, but make sure to spit out the toothpaste and water when finished. °PROCEDURE  °You will receive anesthetics through a mask, through an intravenous (IV) access tube, or through both. A doctor who specializes in anesthesia (anesthesiologist) or a nurse who specializes in anesthesia (nurse anesthetist) or both will stay with you throughout the procedure to make sure you remain unconscious. He or she will also watch your blood pressure, pulse, and oxygen levels to make sure that the anesthetics do not cause any problems. Once you are asleep, a breathing tube or mask may be used to help you breathe. °AFTER THE PROCEDURE °You will wake up after the procedure is complete. You may be in the room where the procedure was performed or in a recovery area. You may have a sore throat   if a breathing tube was used. You may also feel: °· Dizzy. °· Weak. °· Drowsy. °· Confused. °· Nauseous. °· Cold. °These are all normal responses and can be expected to last for up to 24 hours after the procedure is complete. A  caregiver will tell you when you are ready to go home. This will usually be when you are fully awake and in stable condition. °  °This information is not intended to replace advice given to you by your health care provider. Make sure you discuss any questions you have with your health care provider. °  °Document Released: 10/30/2007 Document Revised: 08/13/2014 Document Reviewed: 11/21/2011 °Elsevier Interactive Patient Education ©2016 [employer] ° °

## 2015-11-02 NOTE — Anesthesia Postprocedure Evaluation (Signed)
Anesthesia Post Note  Patient: Lori Blanchard  Procedure(s) Performed: Procedure(s) (LRB): CYSTOSCOPY WITH RETROGRADE PYELOGRAM, URETERAL STENT EXCHANGE (Right)  Patient location during evaluation: PACU Anesthesia Type: General Level of consciousness: awake and alert Pain management: pain level controlled Vital Signs Assessment: post-procedure vital signs reviewed and stable Respiratory status: spontaneous breathing, nonlabored ventilation, respiratory function stable and patient connected to nasal cannula oxygen Cardiovascular status: blood pressure returned to baseline and stable Postop Assessment: no signs of nausea or vomiting Anesthetic complications: no    Last Vitals:  Filed Vitals:   11/02/15 0908 11/02/15 0912  BP: 152/65 132/72  Pulse: 82   Temp: 36.6 C   Resp: 20 20    Last Pain:  Filed Vitals:   11/02/15 0925  PainSc: 2                  Precious Haws Piscitello

## 2015-12-29 ENCOUNTER — Emergency Department
Admission: EM | Admit: 2015-12-29 | Discharge: 2015-12-29 | Disposition: A | Discharge status: Home / Self Care | Payer: Medicare Other | Attending: Emergency Medicine | Admitting: Emergency Medicine

## 2015-12-29 ENCOUNTER — Encounter: Payer: Self-pay | Admitting: Emergency Medicine

## 2015-12-29 ENCOUNTER — Emergency Department: Payer: Medicare Other

## 2015-12-29 DIAGNOSIS — Z7982 Long term (current) use of aspirin: Secondary | ICD-10-CM | POA: Diagnosis not present

## 2015-12-29 DIAGNOSIS — J45909 Unspecified asthma, uncomplicated: Secondary | ICD-10-CM | POA: Diagnosis not present

## 2015-12-29 DIAGNOSIS — Z79899 Other long term (current) drug therapy: Secondary | ICD-10-CM | POA: Insufficient documentation

## 2015-12-29 DIAGNOSIS — R109 Unspecified abdominal pain: Secondary | ICD-10-CM | POA: Insufficient documentation

## 2015-12-29 DIAGNOSIS — M1990 Unspecified osteoarthritis, unspecified site: Secondary | ICD-10-CM | POA: Diagnosis not present

## 2015-12-29 DIAGNOSIS — Z794 Long term (current) use of insulin: Secondary | ICD-10-CM | POA: Insufficient documentation

## 2015-12-29 DIAGNOSIS — E785 Hyperlipidemia, unspecified: Secondary | ICD-10-CM | POA: Diagnosis not present

## 2015-12-29 DIAGNOSIS — E1122 Type 2 diabetes mellitus with diabetic chronic kidney disease: Secondary | ICD-10-CM | POA: Diagnosis not present

## 2015-12-29 DIAGNOSIS — N183 Chronic kidney disease, stage 3 (moderate): Secondary | ICD-10-CM | POA: Diagnosis not present

## 2015-12-29 DIAGNOSIS — I129 Hypertensive chronic kidney disease with stage 1 through stage 4 chronic kidney disease, or unspecified chronic kidney disease: Secondary | ICD-10-CM | POA: Diagnosis not present

## 2015-12-29 DIAGNOSIS — F3341 Major depressive disorder, recurrent, in partial remission: Secondary | ICD-10-CM | POA: Diagnosis not present

## 2015-12-29 DIAGNOSIS — Z7984 Long term (current) use of oral hypoglycemic drugs: Secondary | ICD-10-CM | POA: Insufficient documentation

## 2015-12-29 DIAGNOSIS — R52 Pain, unspecified: Secondary | ICD-10-CM

## 2015-12-29 DIAGNOSIS — G309 Alzheimer's disease, unspecified: Secondary | ICD-10-CM | POA: Insufficient documentation

## 2015-12-29 DIAGNOSIS — E039 Hypothyroidism, unspecified: Secondary | ICD-10-CM | POA: Insufficient documentation

## 2015-12-29 DIAGNOSIS — Z86010 Personal history of colonic polyps: Secondary | ICD-10-CM | POA: Insufficient documentation

## 2015-12-29 DIAGNOSIS — J449 Chronic obstructive pulmonary disease, unspecified: Secondary | ICD-10-CM | POA: Diagnosis not present

## 2015-12-29 LAB — URINALYSIS COMPLETE WITH MICROSCOPIC (ARMC ONLY)
BILIRUBIN URINE: NEGATIVE
GLUCOSE, UA: NEGATIVE mg/dL
Ketones, ur: NEGATIVE mg/dL
NITRITE: NEGATIVE
Protein, ur: 30 mg/dL — AB
SPECIFIC GRAVITY, URINE: 1.016 (ref 1.005–1.030)
pH: 5 (ref 5.0–8.0)

## 2015-12-29 LAB — CBC WITH DIFFERENTIAL/PLATELET
Basophils Absolute: 0 10*3/uL (ref 0–0.1)
Basophils Relative: 0 %
EOS ABS: 0.1 10*3/uL (ref 0–0.7)
EOS PCT: 1 %
HCT: 33.2 % — ABNORMAL LOW (ref 35.0–47.0)
Hemoglobin: 10.5 g/dL — ABNORMAL LOW (ref 12.0–16.0)
LYMPHS ABS: 1.9 10*3/uL (ref 1.0–3.6)
Lymphocytes Relative: 26 %
MCH: 23.4 pg — AB (ref 26.0–34.0)
MCHC: 31.5 g/dL — AB (ref 32.0–36.0)
MCV: 74.1 fL — ABNORMAL LOW (ref 80.0–100.0)
MONOS PCT: 9 %
Monocytes Absolute: 0.6 10*3/uL (ref 0.2–0.9)
Neutro Abs: 4.6 10*3/uL (ref 1.4–6.5)
Neutrophils Relative %: 64 %
PLATELETS: 286 10*3/uL (ref 150–440)
RBC: 4.48 MIL/uL (ref 3.80–5.20)
RDW: 15.6 % — AB (ref 11.5–14.5)
WBC: 7.2 10*3/uL (ref 3.6–11.0)

## 2015-12-29 LAB — COMPREHENSIVE METABOLIC PANEL
ALBUMIN: 3.9 g/dL (ref 3.5–5.0)
ALK PHOS: 45 U/L (ref 38–126)
ALT: 19 U/L (ref 14–54)
AST: 28 U/L (ref 15–41)
Anion gap: 7 (ref 5–15)
BILIRUBIN TOTAL: 0.3 mg/dL (ref 0.3–1.2)
BUN: 21 mg/dL — ABNORMAL HIGH (ref 6–20)
CALCIUM: 8.4 mg/dL — AB (ref 8.9–10.3)
CO2: 21 mmol/L — AB (ref 22–32)
CREATININE: 0.9 mg/dL (ref 0.44–1.00)
Chloride: 112 mmol/L — ABNORMAL HIGH (ref 101–111)
GFR calc non Af Amer: >60 mL/min (ref >60)
GLUCOSE: 111 mg/dL — AB (ref 65–99)
Potassium: 3.8 mmol/L (ref 3.5–5.1)
SODIUM: 140 mmol/L (ref 135–145)
Total Protein: 6.9 g/dL (ref 6.5–8.1)

## 2015-12-29 LAB — LIPASE, BLOOD: Lipase: 36 U/L (ref 11–51)

## 2015-12-29 MED ORDER — DIATRIZOATE MEGLUMINE & SODIUM 66-10 % PO SOLN
15.0000 mL | Freq: Once | ORAL | Status: AC
  Administered 2015-12-29: 15 mL via ORAL

## 2015-12-29 MED ORDER — ONDANSETRON HCL 4 MG/2ML IJ SOLN
INTRAMUSCULAR | Status: AC
  Administered 2015-12-29: 4 mg via INTRAVENOUS
  Filled 2015-12-29: qty 2

## 2015-12-29 MED ORDER — CYCLOBENZAPRINE HCL 5 MG PO TABS: 5.0000 mg | ORAL_TABLET | Freq: Three times a day (TID) | ORAL | Status: DC | PRN

## 2015-12-29 MED ORDER — IOPAMIDOL (ISOVUE-300) INJECTION 61%
100.0000 mL | Freq: Once | INTRAVENOUS | Status: AC | PRN
  Administered 2015-12-29: 100 mL via INTRAVENOUS

## 2015-12-29 MED ORDER — DICYCLOMINE HCL 10 MG/ML IM SOLN: 20.0000 mg | Freq: Once | INTRAMUSCULAR | Status: DC

## 2015-12-29 MED ORDER — ONDANSETRON HCL 4 MG/2ML IJ SOLN
4.0000 mg | Freq: Once | INTRAMUSCULAR | Status: AC
  Administered 2015-12-29: 4 mg via INTRAVENOUS

## 2015-12-29 MED ORDER — ONDANSETRON HCL 4 MG PO TABS: 4.0000 mg | ORAL_TABLET | Freq: Three times a day (TID) | ORAL | Status: DC | PRN

## 2015-12-29 MED ORDER — DICYCLOMINE HCL 10 MG/ML IM SOLN
INTRAMUSCULAR | Status: AC
  Filled 2015-12-29: qty 2

## 2015-12-29 MED ORDER — CYCLOBENZAPRINE HCL 10 MG PO TABS
5.0000 mg | ORAL_TABLET | Freq: Once | ORAL | Status: AC
  Administered 2015-12-29: 5 mg via ORAL
  Filled 2015-12-29: qty 2

## 2015-12-29 NOTE — ED Provider Notes (Signed)
[facility] Emergency Department Provider Note    ____________________________________________  Time seen: ~1030  I have reviewed the triage vital signs and the nursing notes.   HISTORY  Chief Complaint Abdominal Pain   History limited by: Not Limited   HPI Lori Blanchard is a 70 y.o. female who presents to the emergency department today because of concerns for abdominal pain. It became constant and was worse yesterday and has continued through today. She has been having some issues with abdominal pain for the past 2 weeks. Additionally she has had diarrhea for the past 2 weeks. She denies any bloody stool. She is not as any black or tarry stool. Having the bowel movements does not significantly change the pain. No fevers. Denies similar symptoms in the past. Denies any fevers.   Past Medical History  Diagnosis Date  . Hyperlipemia   . Hypertension   . Asthma   . Osteoarthritis   . Gout   . Osteoarthritis   . Hypothyroidism   . Anxiety and depression   . Diabetes ([place])   . Hydronephrosis   . Retroperitoneal fibrosis   . Pulmonary nodule   . Family history of adverse reaction to anesthesia     daughter has breathing problems after anesthesia  . Alzheimer disease   . Sleep apnea     Patient Active Problem List   Diagnosis Date Noted  . Recurrent major depressive disorder, in partial remission ([place]) 10/20/2015  . Morbid obesity ([place]) 10/20/2015  . Major depression in remission ([place]) 09/01/2015  . 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 Abdominal hernia 04/22/2015  . Anxiety 04/22/2015  . Asthma without status asthmaticus 04/22/2015  . CAFL (chronic airflow limitation) ([place]) 04/22/2015  . Colon polyp 04/22/2015  . Depression, major, recurrent, in partial remission ([place]) 04/22/2015  . Fatty infiltration of liver 04/22/2015  . Gout 04/22/2015  . Hypersomnia with sleep apnea 04/22/2015  . Extreme obesity ([place]) 04/22/2015   . Encounter for general adult medical examination without abnormal findings 12/28/2014  . Adult BMI 30+ 09/23/2014  . COPD, moderate ([place]) 05/13/2014  . Moderate COPD (chronic obstructive pulmonary disease) ([place]) 05/13/2014  . Chronic kidney disease (CKD), stage III (moderate) 01/30/2014  . Benign hypertension 01/30/2014  . Type 2 diabetes mellitus with other specified complication ([place]) [id number]  . Adult hypothyroidism 01/30/2014  . Arthritis, degenerative 01/30/2014  . Obstructive apnea 01/30/2014  . Diabetes mellitus, type 2 ([place]) 01/30/2014    Past Surgical History  Procedure Laterality Date  . Laparoscopic hysterectomy    . Carpal tunnel release    . Replacement total knee Bilateral   . Cesarean section      x 3  . Breast surgery Bilateral 1990    Reduction  . Abdominal hysterectomy      partial  . Joint replacement Left 2013    TKR  . Joint replacement Right 2011    TKR  . Hernia repair      umbilical  . Cystoscopy w/ ureteral stent placement Right 05/11/2015    Procedure: CYSTOSCOPY WITH STENT REPLACEMENT;  Surgeon: [name], MD;  Location: [facility] ORS;  Service: Urology;  Laterality: Right;  . Cystoscopy w/ retrogrades Right 11/02/2015    Procedure: CYSTOSCOPY WITH RETROGRADE PYELOGRAM, URETERAL STENT EXCHANGE;  Surgeon: [name], MD;  Location: [facility] ORS;  Service: Urology;  Laterality: Right;    Current Outpatient Rx  Name  Route  Sig  Dispense  Refill  . albuterol (  PROAIR HFA) 108 (90 BASE) MCG/ACT inhaler      USE 2 PUFFS EVERY FOUR HOURS AS NEEDED.         Marland Kitchen albuterol (PROVENTIL) (2.5 MG/3ML) 0.083% nebulizer solution   Inhalation   Inhale into the lungs.         Marland Kitchen alendronate (FOSAMAX) 70 MG tablet      TAKE ONE TABLET EVERY WEEK. TAKE WITH A FUUL GLASS OF WATER AND DO NOT LIE DOWN FOR THE NEXT 30 MINUTES.  usually on Friday or Saturday.         Marland Kitchen aspirin EC 81 MG tablet   Oral   Take by mouth every morning.          Marland Kitchen  atorvastatin (LIPITOR) 40 MG tablet   Oral   Take by mouth every morning.          Marland Kitchen EXPIRED: buPROPion (WELLBUTRIN XL) 150 MG 24 hr tablet   Oral   Take by mouth every morning.          . cephALEXin (KEFLEX) 500 MG capsule   Oral   Take 1 capsule (500 mg total) by mouth 3 (three) times daily.   6 capsule   0   . Cyanocobalamin (RA VITAMIN B-12 TR) 1000 MCG TBCR   Oral   Take by mouth.         . Cyanocobalamin (VITAMIN B 12 PO)   Oral   Take 50 mcg by mouth every morning.         . donepezil (ARICEPT) 5 MG tablet               . fenofibrate (TRICOR) 145 MG tablet      TAKE ONE (1) TABLET EACH DAY in am         . furosemide (LASIX) 20 MG tablet   Oral   Take 20 mg by mouth every morning.          Marland Kitchen HYDROcodone-acetaminophen (NORCO) 5-325 MG tablet   Oral   Take 1 tablet by mouth every 6 (six) hours as needed for moderate pain.   30 tablet   0   . insulin lispro (HUMALOG) 100 UNIT/ML KiwkPen   Subcutaneous   Inject 10 Units into the skin 3 (three) times daily.          . Insulin Pen Needle (PEN NEEDLES 31GX5/16") 31G X 8 MM MISC   Subcutaneous   Inject into the skin.         . Insulin Pen Needle (SURE COMFORT PEN NEEDLES) 31G X 5 MM MISC      USE 4 TIMES A DAY OR AS DIRECTED BY DOCTOR         . LANTUS SOLOSTAR 100 UNIT/ML Solostar Pen   Subcutaneous   Inject 20 Units into the skin daily at 10 pm.            Dispense as written.   Marland Kitchen levothyroxine (SYNTHROID, LEVOTHROID) 50 MCG tablet      TAKE 1 TABLET ON AN EMPTY STOMACH AT LEAST 30-60 MINUTES BEFORE BREAKFAST         . metFORMIN (GLUCOPHAGE) 1000 MG tablet      twice a day         . pantoprazole (PROTONIX) 40 MG tablet   Oral   Take by mouth every morning.          . senna (SENOKOT) 8.6 MG tablet   Oral   Take by mouth 2 (  two) times daily. At 8 am and 5 pm         . telmisartan (MICARDIS) 40 MG tablet      TAKE ONE (1) TABLET EACH DAY in am         . vitamin  E 1000 UNIT capsule   Oral   Take by mouth.         . vitamin E 400 UNIT capsule   Oral   Take 400 Units by mouth every morning.           Allergies Ibuprofen  Family History  Problem Relation Age of Onset  . Liver cancer Mother   . Colon cancer Mother   . Breast cancer Mother   . Diabetes Daughter   . Kidney disease Daughter     adrenal tumors  . Kidney cancer Neg Hx   . Prostate cancer Neg Hx     Social History Social History  Substance Use Topics  . Smoking status: Never Smoker   . Smokeless tobacco: Never Used  . Alcohol Use: No    Review of Systems  Constitutional: Negative for fever. Cardiovascular: Negative for chest pain. Respiratory: Negative for shortness of breath. Gastrointestinal:Positive for abdominal pain Genitourinary: Negative for dysuria. Musculoskeletal: Negative for back pain. Skin: Negative for rash. Neurological: Negative for headaches, focal weakness or numbness.   10-point ROS otherwise negative.  ____________________________________________   PHYSICAL EXAM:  VITAL SIGNS: ED Triage Vitals  Enc Vitals Group     BP 12/29/15 0939 166/76 mmHg     Pulse Rate 12/29/15 0939 77     Resp 12/29/15 0939 20     Temp 12/29/15 0939 97.5 F (36.4 C)     Temp Source 12/29/15 0939 Oral     SpO2 12/29/15 0939 96 %     Weight 12/29/15 0939 165 lb (74.844 kg)     Height 12/29/15 0939 4\' 8"  (1.422 m)     Head Cir --      Peak Flow --      Pain Score 12/29/15 0939 8   Constitutional: Alert and oriented. Well appearing and in no distress. Eyes: Conjunctivae are normal. PERRL. Normal extraocular movements. ENT   Head: Normocephalic and atraumatic.   Nose: No congestion/rhinnorhea.   Mouth/Throat: Mucous membranes are moist.   Neck: No stridor. Hematological/Lymphatic/Immunilogical: No cervical lymphadenopathy. Cardiovascular: Normal rate, regular rhythm.  No murmurs, rubs, or gallops. Respiratory: Normal respiratory effort  without tachypnea nor retractions. Breath sounds are clear and equal bilaterally. No wheezes/rales/rhonchi. Gastrointestinal: Soft. Somewhat distended. Diffusely tender to palpation. No rebound. No guarding. Genitourinary: Deferred Musculoskeletal: Normal range of motion in all extremities. No joint effusions.  No lower extremity tenderness nor edema. Neurologic:  Normal speech and language. No gross focal neurologic deficits are appreciated.  Skin:  Skin is warm, dry and intact. No rash noted. Psychiatric: Mood and affect are normal. Speech and behavior are normal. Patient exhibits appropriate insight and judgment.  ____________________________________________    LABS (pertinent positives/negatives)  Labs Reviewed  URINALYSIS COMPLETEWITH MICROSCOPIC ([facility] ONLY) - Abnormal; Notable for the following:    Color, Urine YELLOW (*)    APPearance CLEAR (*)    Hgb urine dipstick 2+ (*)    Protein, ur 30 (*)    Leukocytes, UA 1+ (*)    Bacteria, UA RARE (*)    Squamous Epithelial / LPF 0-5 (*)    All other components within normal limits  COMPREHENSIVE METABOLIC PANEL - Abnormal; Notable for the following:  Chloride 112 (*)    CO2 21 (*)    Glucose, Bld 111 (*)    BUN 21 (*)    Calcium 8.4 (*)    All other components within normal limits  CBC WITH DIFFERENTIAL/PLATELET - Abnormal; Notable for the following:    Hemoglobin 10.5 (*)    HCT 33.2 (*)    MCV 74.1 (*)    MCH 23.4 (*)    MCHC 31.5 (*)    RDW 15.6 (*)    All other components within normal limits  LIPASE, BLOOD     ____________________________________________   EKG  None  ____________________________________________    RADIOLOGY  CT abd/pel IMPRESSION: Right ureteral stent with proximal tip within dilated external pelvis and distal tip within the urinary bladder. Associated moderate right hydronephrosis and inflammatory perirenal fat stranding.  Nonobstructive left  nephrolithiasis. ____________________________________________   PROCEDURES  Procedure(s) performed: None  Critical Care performed: No  ____________________________________________   INITIAL IMPRESSION / ASSESSMENT AND PLAN / ED COURSE  Pertinent labs & imaging results that were available during my care of the patient were reviewed by me and considered in my medical decision making (see chart for details).  Patient presented to the emergency department today because of concerns for abdominal pain. She has also had diarrhea for the past 2 weeks. On exam patient is somewhat diffusely tender to palpation in the abdomen. It is soft without any other peritoneal signs. At this point will plan on getting a CT scan to check blood work.  ----------------------------------------- 1:33 PM on 12/29/2015 -----------------------------------------  Patient CT scan without any concerning acute findings. Patient stated she felt less nauseous with the Zofran. Flexeril did steam to start helping the patient. At this point unclear etiology of the pain. Discussed with patient that she should follow up with GI if he continues or she develops any other concerning chest symptoms.  ____________________________________________   FINAL CLINICAL IMPRESSION(S) / ED DIAGNOSES  Final diagnoses:  Pain     Note: This dictation was prepared with Dragon dictation. Any transcriptional errors that result from this process are unintentional    Nance Pear, MD 12/29/15 1451

## 2015-12-29 NOTE — ED Notes (Signed)
Pt to ed with c/o abd pain and diarrhea x 2 weeks. States pain started on left side and radiates across abd.

## 2015-12-29 NOTE — ED Notes (Signed)
Pt given contrast to drink at this time. Pt family at bedside at this time.

## 2015-12-29 NOTE — Discharge Instructions (Signed)
Please seek medical attention for any high fevers, chest pain, shortness of breath, change in behavior, persistent vomiting, bloody stool or any other new or concerning symptoms.   Pain Without a Known Cause WHAT IS PAIN WITHOUT A KNOWN CAUSE? Pain can occur in any part of the body and can range from mild to severe. Sometimes no cause can be found for why you are having pain. Some types of pain that can occur without a known cause include:   Headache.  Back pain.  Abdominal pain.  Neck pain. HOW IS PAIN WITHOUT A KNOWN CAUSE DIAGNOSED?  Your health care provider will try to find the cause of your pain. This may include:  Physical exam.  Medical history.  Blood tests.  Urine tests.  X-rays. If no cause is found, your health care provider may diagnose you with pain without a known cause.  IS THERE TREATMENT FOR PAIN WITHOUT A CAUSE?  Treatment depends on the kind of pain you have. Your health care provider may prescribe medicines to help relieve your pain.  WHAT CAN I DO AT HOME FOR MY PAIN?   Take medicines only as directed by your health care provider.  Stop any activities that cause pain. During periods of severe pain, bed rest may help.  Try to reduce your stress with activities such as yoga or meditation. Talk to your health care provider for other stress-reducing activity recommendations.  Exercise regularly, if approved by your health care provider.  Eat a healthy diet that includes fruits and vegetables. This may improve pain. Talk to your health care provider if you have any questions about your diet. WHAT IF MY PAIN DOES NOT GET BETTER?  If you have a painful condition and no reason can be found for the pain or the pain gets worse, it is important to follow up with your health care provider. It may be necessary to repeat tests and look further for a possible cause.    This information is not intended to replace advice given to you by your health care provider. Make  sure you discuss any questions you have with your health care provider.   Document Released: 04/17/2001 Document Revised: 08/13/2014 Document Reviewed: 12/08/2013 Elsevier Interactive Patient Education [employer].

## 2015-12-29 NOTE — ED Notes (Signed)
This RN called to bedside by patient family. Per patient family patient is feeling nauseous and requesting crackers for her sugar being low. This RN received orders from Dr. Archie Balboa for Bentyl and Zofran. Pt refused bentyl stating "the pain isn't in my belly, it's in my side to my back". Dr. Archie Balboa notified of pt refusal, received order for Flexeril. This RN discussed with patient that MD had ordered Flexeril which was a muscle relaxer. Pt's daughter states "is that going to help her pain?" this RN explained to daughter there was no guarantee for pain, pt's daughter states she would like to try the flexeril. Pt's daughter then states "you can't just sit there and let them be in pain", requesting to know "who [she] needs to talk to" in regards of getting patient pain medication. Pt's daughter stated repeatedly to this RN that "you can't just let her sit there crying in pain". Pt noted to be somewhat uncomfortable, shifting positions in the bed. Respirations even and unlabored, skin warm, dry, and intact at this time. MD notified of situation, pt's primary RN, Lauryn aware of situation at this time.

## 2015-12-29 NOTE — ED Notes (Signed)
Report given to Lauryn, RN.  

## 2015-12-29 NOTE — ED Notes (Signed)
CT called, pt completed contrast at this time  

## 2016-01-05 ENCOUNTER — Telehealth: Payer: Self-pay | Admitting: Radiology

## 2016-01-05 NOTE — Telephone Encounter (Signed)
Notified pt of surgery scheduled 02/01/16, pre-admit testing appt on 6/14 @10 :30 & to call day prior to surgery for arrival time to SDS. Pt voices understanding.

## 2016-01-18 ENCOUNTER — Encounter
Admission: RE | Admit: 2016-01-18 | Discharge: 2016-01-18 | Disposition: A | Discharge status: Home / Self Care | Payer: Medicare Other | Source: Ambulatory Visit | Attending: Urology | Admitting: Urology

## 2016-01-18 DIAGNOSIS — Z01818 Encounter for other preprocedural examination: Secondary | ICD-10-CM | POA: Diagnosis present

## 2016-01-18 DIAGNOSIS — J449 Chronic obstructive pulmonary disease, unspecified: Secondary | ICD-10-CM | POA: Insufficient documentation

## 2016-01-18 DIAGNOSIS — J45909 Unspecified asthma, uncomplicated: Secondary | ICD-10-CM | POA: Insufficient documentation

## 2016-01-18 LAB — URINALYSIS COMPLETE WITH MICROSCOPIC (ARMC ONLY)
BILIRUBIN URINE: NEGATIVE
Glucose, UA: NEGATIVE mg/dL
KETONES UR: NEGATIVE mg/dL
Nitrite: NEGATIVE
Protein, ur: NEGATIVE mg/dL
Specific Gravity, Urine: 1.016 (ref 1.005–1.030)
pH: 6 (ref 5.0–8.0)

## 2016-01-18 NOTE — Pre-Procedure Instructions (Signed)
ANESTHESIA - PULMONARY CLEARANCE ON CHART 

## 2016-01-18 NOTE — Patient Instructions (Signed)
Your procedure is scheduled on: Wednesday 02/01/16 Report to Day Surgery. 2ND FLOOR MEDICAL MALL ENTRANCE To find out your arrival time please call [phone] between 1PM - 3PM on Tuesday 01/31/16.  Remember: Instructions that are not followed completely may result in serious medical risk, up to and including death, or upon the discretion of your surgeon and anesthesiologist your surgery may need to be rescheduled.    __X__ 1. Do not eat food or drink liquids after midnight. No gum chewing or hard candies.     __X__ 2. No Alcohol for 24 hours before or after surgery.   ____ 3. Bring all medications with you on the day of surgery if instructed.    __X__ 4. Notify your doctor if there is any change in your medical condition     (cold, fever, infections).     Do not wear jewelry, make-up, hairpins, clips or nail polish.  Do not wear lotions, powders, or perfumes.   Do not shave 48 hours prior to surgery. Men may shave face and neck.  Do not bring valuables to the hospital.    [facility] is not responsible for any belongings or valuables.               Contacts, dentures or bridgework may not be worn into surgery.  Leave your suitcase in the car. After surgery it may be brought to your room.  For patients admitted to the hospital, discharge time is determined by your                treatment team.   Patients discharged the day of surgery will not be allowed to drive home.   Please read over the following fact sheets that you were given:   Surgical Site Infection Prevention   __X__ Take these medicines the morning of surgery with A SIP OF WATER:    1. ATORVASTATIN  2. BUPROPION  3. DONEPEZIL  4. FENOFIBRATE  5. LEVOTHYROXINE  6. PANTOPRAZOLE  7. TELMISARTAN  ____ Fleet Enema (as directed)   ____ Use CHG Soap as directed  __X__ Use inhalers on the day of surgery  __X__ Stop metformin 2 days prior to surgery    __X__ Take 1/2 of usual insulin dose the night before surgery  and none on the morning of surgery.   __X__ Stop Coumadin/Plavix/aspirin on 1 WEEK BEFORE SURGERY  ____ Stop Anti-inflammatories on    __X__ Stop supplements until after surgery.  B12  ____ Bring C-Pap to the hospital.

## 2016-01-19 LAB — URINE CULTURE

## 2016-01-20 NOTE — Pre-Procedure Instructions (Signed)
UA and urine culture results sent to Dr. Alyson Ingles for review.  Urine culture suggested recollection, questioned if wants this done?

## 2016-01-23 ENCOUNTER — Ambulatory Visit (INDEPENDENT_AMBULATORY_CARE_PROVIDER_SITE_OTHER): Payer: Medicare Other

## 2016-01-23 DIAGNOSIS — N135 Crossing vessel and stricture of ureter without hydronephrosis: Secondary | ICD-10-CM

## 2016-01-23 LAB — URINALYSIS, COMPLETE
BILIRUBIN UA: NEGATIVE
GLUCOSE, UA: NEGATIVE
KETONES UA: NEGATIVE
NITRITE UA: NEGATIVE
Protein, UA: NEGATIVE
SPEC GRAV UA: 1.01 (ref 1.005–1.030)
UUROB: 0.2 mg/dL (ref 0.2–1.0)
pH, UA: 5.5 (ref 5.0–7.5)

## 2016-01-23 LAB — MICROSCOPIC EXAMINATION: Bacteria, UA: NONE SEEN

## 2016-01-23 NOTE — Progress Notes (Signed)
In and Out Catheterization  Patient is present today for a I & O catheterization due to stent exchange. Patient was cleaned and prepped in a sterile fashion with betadine and Lidocaine 2% jelly was instilled into the urethra.  A 14FR cath was inserted no complications were noted , 128ml of urine return was noted, urine was yellow in color. A clean urine sample was collected for u/a and cx. Bladder was drained  And catheter was removed with out difficulty.    Preformed by: Toniann Fail, LPN

## 2016-01-27 ENCOUNTER — Telehealth: Payer: Self-pay

## 2016-01-27 LAB — CULTURE, URINE COMPREHENSIVE

## 2016-01-27 NOTE — Telephone Encounter (Signed)
Spoke with pt in reference to -ucx. Pt voiced understanding.  

## 2016-01-31 ENCOUNTER — Encounter
Admission: RE | Disposition: A | Discharge status: Home / Self Care | Payer: Self-pay | Source: Ambulatory Visit | Attending: Urology

## 2016-01-31 ENCOUNTER — Ambulatory Visit
Admission: RE | Admit: 2016-01-31 | Discharge: 2016-01-31 | Disposition: A | Discharge status: Home / Self Care | Payer: Medicare Other | Source: Ambulatory Visit | Attending: Urology | Admitting: Urology

## 2016-01-31 ENCOUNTER — Encounter: Payer: Self-pay | Admitting: *Deleted

## 2016-01-31 ENCOUNTER — Ambulatory Visit: Payer: Medicare Other | Admitting: Anesthesiology

## 2016-01-31 DIAGNOSIS — E039 Hypothyroidism, unspecified: Secondary | ICD-10-CM | POA: Insufficient documentation

## 2016-01-31 DIAGNOSIS — G4730 Sleep apnea, unspecified: Secondary | ICD-10-CM | POA: Insufficient documentation

## 2016-01-31 DIAGNOSIS — E119 Type 2 diabetes mellitus without complications: Secondary | ICD-10-CM | POA: Insufficient documentation

## 2016-01-31 DIAGNOSIS — E785 Hyperlipidemia, unspecified: Secondary | ICD-10-CM | POA: Diagnosis not present

## 2016-01-31 DIAGNOSIS — J45909 Unspecified asthma, uncomplicated: Secondary | ICD-10-CM | POA: Insufficient documentation

## 2016-01-31 DIAGNOSIS — Z79899 Other long term (current) drug therapy: Secondary | ICD-10-CM | POA: Insufficient documentation

## 2016-01-31 DIAGNOSIS — A4189 Other specified sepsis: Secondary | ICD-10-CM | POA: Insufficient documentation

## 2016-01-31 DIAGNOSIS — F419 Anxiety disorder, unspecified: Secondary | ICD-10-CM | POA: Insufficient documentation

## 2016-01-31 DIAGNOSIS — M1990 Unspecified osteoarthritis, unspecified site: Secondary | ICD-10-CM | POA: Insufficient documentation

## 2016-01-31 DIAGNOSIS — G309 Alzheimer's disease, unspecified: Secondary | ICD-10-CM | POA: Diagnosis not present

## 2016-01-31 DIAGNOSIS — N136 Pyonephrosis: Secondary | ICD-10-CM | POA: Diagnosis not present

## 2016-01-31 DIAGNOSIS — N183 Chronic kidney disease, stage 3 unspecified: Secondary | ICD-10-CM

## 2016-01-31 DIAGNOSIS — F0280 Dementia in other diseases classified elsewhere without behavioral disturbance: Secondary | ICD-10-CM | POA: Diagnosis not present

## 2016-01-31 DIAGNOSIS — F329 Major depressive disorder, single episode, unspecified: Secondary | ICD-10-CM | POA: Diagnosis not present

## 2016-01-31 DIAGNOSIS — N132 Hydronephrosis with renal and ureteral calculous obstruction: Secondary | ICD-10-CM

## 2016-01-31 DIAGNOSIS — I10 Essential (primary) hypertension: Secondary | ICD-10-CM | POA: Insufficient documentation

## 2016-01-31 DIAGNOSIS — Z794 Long term (current) use of insulin: Secondary | ICD-10-CM | POA: Insufficient documentation

## 2016-01-31 DIAGNOSIS — M109 Gout, unspecified: Secondary | ICD-10-CM | POA: Insufficient documentation

## 2016-01-31 DIAGNOSIS — Z7982 Long term (current) use of aspirin: Secondary | ICD-10-CM | POA: Insufficient documentation

## 2016-01-31 HISTORY — PX: CYSTOSCOPY W/ RETROGRADES: SHX1426

## 2016-01-31 HISTORY — PX: CYSTOSCOPY W/ URETERAL STENT PLACEMENT: SHX1429

## 2016-01-31 LAB — GLUCOSE, CAPILLARY
Glucose-Capillary: 209 mg/dL — ABNORMAL HIGH (ref 65–99)
Glucose-Capillary: 210 mg/dL — ABNORMAL HIGH (ref 65–99)

## 2016-01-31 SURGERY — CYSTOSCOPY, WITH RETROGRADE PYELOGRAM
Anesthesia: General | Laterality: Right | Wound class: Clean Contaminated

## 2016-01-31 MED ORDER — HYDROCODONE-ACETAMINOPHEN 5-325 MG PO TABS: 1.0000 | ORAL_TABLET | Freq: Four times a day (QID) | ORAL | Status: DC | PRN

## 2016-01-31 MED ORDER — CEFAZOLIN SODIUM-DEXTROSE 2-4 GM/100ML-% IV SOLN
2.0000 g | Freq: Once | INTRAVENOUS | Status: AC
  Administered 2016-01-31: 2 g via INTRAVENOUS

## 2016-01-31 MED ORDER — IOTHALAMATE MEGLUMINE 43 % IV SOLN
INTRAVENOUS | Status: DC | PRN
  Administered 2016-01-31: 15 mL

## 2016-01-31 MED ORDER — SODIUM CHLORIDE 0.9 % IV SOLN
INTRAVENOUS | Status: DC | PRN
  Administered 2016-01-31: 08:00:00 via INTRAVENOUS

## 2016-01-31 MED ORDER — LACTATED RINGERS IV SOLN: INTRAVENOUS | Status: DC | PRN

## 2016-01-31 MED ORDER — ONDANSETRON HCL 4 MG/2ML IJ SOLN: 4.0000 mg | Freq: Once | INTRAMUSCULAR | Status: DC | PRN

## 2016-01-31 MED ORDER — ONDANSETRON HCL 4 MG/2ML IJ SOLN
INTRAMUSCULAR | Status: DC | PRN
Start: 2016-01-31 — End: 2016-01-31
  Administered 2016-01-31: 4 mg via INTRAVENOUS

## 2016-01-31 MED ORDER — PROPOFOL 10 MG/ML IV BOLUS
INTRAVENOUS | Status: DC | PRN
  Administered 2016-01-31: 200 mg via INTRAVENOUS

## 2016-01-31 MED ORDER — SODIUM CHLORIDE 0.9 % IV SOLN
INTRAVENOUS | Status: DC
  Administered 2016-01-31: 07:00:00 via INTRAVENOUS

## 2016-01-31 MED ORDER — LIDOCAINE HCL (CARDIAC) 20 MG/ML IV SOLN
INTRAVENOUS | Status: DC | PRN
  Administered 2016-01-31: 50 mg via INTRAVENOUS

## 2016-01-31 MED ORDER — CEFAZOLIN SODIUM-DEXTROSE 2-4 GM/100ML-% IV SOLN
INTRAVENOUS | Status: AC
  Filled 2016-01-31: qty 100

## 2016-01-31 MED ORDER — FENTANYL CITRATE (PF) 100 MCG/2ML IJ SOLN
INTRAMUSCULAR | Status: DC | PRN
  Administered 2016-01-31: 25 ug via INTRAVENOUS

## 2016-01-31 MED ORDER — FENTANYL CITRATE (PF) 100 MCG/2ML IJ SOLN: 25.0000 ug | INTRAMUSCULAR | Status: DC | PRN

## 2016-01-31 SURGICAL SUPPLY — 20 items
BAG DRAIN CYSTO-URO LG1000N (MISCELLANEOUS) ×3 IMPLANT
CATH URETL 5X70 OPEN END (CATHETERS) ×3 IMPLANT
CONRAY 43 FOR UROLOGY 50M (MISCELLANEOUS) ×3 IMPLANT
GLOVE BIO SURGEON STRL SZ7 (GLOVE) ×3 IMPLANT
GLOVE BIO SURGEON STRL SZ8 (GLOVE) ×3 IMPLANT
GOWN STRL REUS W/ TWL LRG LVL3 (GOWN DISPOSABLE) ×1 IMPLANT
GOWN STRL REUS W/TWL LRG LVL3 (GOWN DISPOSABLE) ×3
GUIDEWIRE STR ZIPWIRE 035X150 (MISCELLANEOUS) ×3 IMPLANT
KIT RM TURNOVER CYSTO AR (KITS) ×3 IMPLANT
PACK CYSTO AR (MISCELLANEOUS) ×3 IMPLANT
PREP PVP WINGED SPONGE (MISCELLANEOUS) IMPLANT
SET CYSTO W/LG BORE CLAMP LF (SET/KITS/TRAYS/PACK) ×3 IMPLANT
SOL .9 NS 3000ML IRR  AL (IV SOLUTION) ×2
SOL .9 NS 3000ML IRR AL (IV SOLUTION) ×1
SOL .9 NS 3000ML IRR UROMATIC (IV SOLUTION) ×1 IMPLANT
STENT URET 6FRX24 CONTOUR (STENTS) ×3 IMPLANT
STENT URET 6FRX26 CONTOUR (STENTS) ×3 IMPLANT
SURGILUBE 2OZ TUBE FLIPTOP (MISCELLANEOUS) ×3 IMPLANT
SYRINGE IRR TOOMEY STRL 70CC (SYRINGE) ×3 IMPLANT
WATER STERILE IRR 1000ML POUR (IV SOLUTION) ×3 IMPLANT

## 2016-01-31 NOTE — Anesthesia Postprocedure Evaluation (Signed)
Anesthesia Post Note  Patient: Lori Blanchard  Procedure(s) Performed: Procedure(s) (LRB): CYSTOSCOPY WITH RETROGRADE PYELOGRAM (Right) CYSTOSCOPY, RETROGRADE PYELOGRAMS WITH STENT REPLACEMENT (Right)  Patient location during evaluation: PACU Anesthesia Type: General Level of consciousness: awake Pain management: pain level controlled Vital Signs Assessment: post-procedure vital signs reviewed and stable Respiratory status: respiratory function stable Cardiovascular status: stable Anesthetic complications: no    Last Vitals:  Filed Vitals:   01/31/16 0810 01/31/16 0825  BP: 124/68 118/68  Pulse: 96 83  Temp: 36.5 C   Resp: 16 19    Last Pain:  Filed Vitals:   01/31/16 0834  PainSc: 5                  VAN STAVEREN,Jkayla Spiewak

## 2016-01-31 NOTE — Transfer of Care (Signed)
Immediate Anesthesia Transfer of Care Note  Patient: Lori Blanchard  Procedure(s) Performed: Procedure(s): CYSTOSCOPY WITH RETROGRADE PYELOGRAM (Right) CYSTOSCOPY, RETROGRADE PYELOGRAMS WITH STENT REPLACEMENT (Right)  Patient Location: PACU  Anesthesia Type:General  Level of Consciousness: awake  Airway & Oxygen Therapy: Patient Spontanous Breathing  Post-op Assessment: Report given to RN and Post -op Vital signs reviewed and stable  Post vital signs: Reviewed  Last Vitals:  Filed Vitals:   01/31/16 0625 01/31/16 0810  BP: 144/77 124/68  Pulse: 82 96  Temp: 35.6 C 36.5 C  Resp: 16 16    Last Pain:  Filed Vitals:   01/31/16 0812  PainSc: 5          Complications: No apparent anesthesia complications

## 2016-01-31 NOTE — Anesthesia Preprocedure Evaluation (Signed)
Anesthesia Evaluation  Patient identified by MRN, date of birth, ID band Patient awake    Reviewed: Allergy & Precautions, NPO status , Patient's Chart, lab work & pertinent test results  Airway Mallampati: II       Dental  (+) Teeth Intact   Pulmonary asthma , sleep apnea ,     + decreased breath sounds      Cardiovascular Exercise Tolerance: Poor hypertension, Pt. on medications  Rhythm:Regular     Neuro/Psych Anxiety Depression    GI/Hepatic negative GI ROS, Neg liver ROS,   Endo/Other  diabetes, Type 2, Oral Hypoglycemic AgentsHypothyroidism Morbid obesity  Renal/GU Renal InsufficiencyRenal disease     Musculoskeletal   Abdominal (+) + obese,   Peds  Hematology   Anesthesia Other Findings   Reproductive/Obstetrics                             Anesthesia Physical Anesthesia Plan  ASA: III  Anesthesia Plan: General   Post-op Pain Management:    Induction: Intravenous  Airway Management Planned: LMA  Additional Equipment:   Intra-op Plan:   Post-operative Plan: Extubation in OR  Informed Consent: I have reviewed the patients History and Physical, chart, labs and discussed the procedure including the risks, benefits and alternatives for the proposed anesthesia with the patient or authorized representative who has indicated his/her understanding and acceptance.     Plan Discussed with: CRNA  Anesthesia Plan Comments:         Anesthesia Quick Evaluation

## 2016-01-31 NOTE — H&P (Signed)
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 She has intermitent right flank pain. Last urine culture showed multiple species.  Past Medical History  Diagnosis Date  . Hyperlipemia   . Hypertension   . Asthma   . Osteoarthritis   . Gout   . Osteoarthritis   . Hypothyroidism   . Anxiety and depression   . Diabetes ([place])   . Hydronephrosis   . Retroperitoneal fibrosis   . Pulmonary nodule   . Family history of adverse reaction to anesthesia     daughter has breathing problems after anesthesia  . Alzheimer disease   . Sleep apnea    Past Surgical History  Procedure Laterality Date  . Laparoscopic hysterectomy    . Carpal tunnel release    . Replacement total knee Bilateral   . Cesarean section      x 3  . Breast surgery Bilateral 1990    Reduction  . Abdominal hysterectomy      partial  . Joint replacement Left 2013    TKR  . Joint replacement Right 2011    TKR  . Hernia repair      umbilical  . Cystoscopy w/ ureteral stent placement Right 05/11/2015    Procedure: CYSTOSCOPY WITH STENT REPLACEMENT;  Surgeon: [name], MD;  Location: [facility] ORS;  Service: Urology;  Laterality: Right;  . Cystoscopy w/ retrogrades Right 11/02/2015    Procedure: CYSTOSCOPY WITH RETROGRADE PYELOGRAM, URETERAL STENT EXCHANGE;  Surgeon: [name], MD;  Location: [facility] OR[facility];  Service: Urology;  Laterality: Right;    Home Medications:  Prescriptions prior to admission  Medication Sig Dispense Refill Last Dose  . albuterol (PROAIR HFA) 108 (90 BASE) MCG/ACT inhaler USE 2 PUFFS EVERY FOUR HOURS AS NEEDED.   01/31/2016 at 0430  . atorvastatin (LIPITOR) 40 MG tablet Take 40 mg by mouth every morning.    01/31/2016 at 0430  . buPROPion (WELLBUTRIN XL) 150 MG 24 hr tablet Take 150 mg by mouth every morning.    01/31/2016 at 0430  . donepezil (ARICEPT) 5 MG  tablet Take 5 mg by mouth daily.    01/31/2016 at 0430  . fenofibrate (TRICOR) 145 MG tablet TAKE ONE (1) TABLET EACH DAY in am   01/31/2016 at 0430  . furosemide (LASIX) 20 MG tablet Take 20 mg by mouth every morning.    01/30/2016 at am  . insulin lispro (HUMALOG) 100 UNIT/ML KiwkPen Inject 10 Units into the skin 3 (three) times daily.    01/30/2016 at pm  . LANTUS SOLOSTAR 100 UNIT/ML Solostar Pen Inject 20 Units into the skin daily at 10 pm.    01/30/2016 at pm (10 units)  . levothyroxine (SYNTHROID, LEVOTHROID) 50 MCG tablet TAKE 1 TABLET ON AN EMPTY STOMACH AT LEAST 30-60 MINUTES BEFORE BREAKFAST   01/31/2016 at 0430  . pantoprazole (PROTONIX) 40 MG tablet Take 40 mg by mouth every morning.    01/31/2016 at 0430  . senna (SENOKOT) 8.6 MG tablet Take 1 tablet by mouth 2 (two) times daily. At 8 am and 5 pm   01/30/2016 at am  . telmisartan (MICARDIS) 40 MG tablet TAKE ONE (1) TABLET EACH DAY in am   01/31/2016 at 0430  . albuterol (PROVENTIL) (2.5 MG/3ML) 0.083% nebulizer solution Inhale 2.5 mg into the lungs every 4 (four) hours as needed for wheezing or shortness of breath.    "  yesterday or day before"  . alendronate (FOSAMAX) 70 MG tablet TAKE ONE TABLET EVERY WEEK. TAKE WITH A FUUL GLASS OF WATER AND DO NOT LIE DOWN FOR THE NEXT 30 MINUTES.  usually on Friday or Saturday.   01/27/2016  . aspirin EC 81 MG tablet Take 81 mg by mouth every morning.    'think about a week ago"  . Cyanocobalamin (RA VITAMIN B-12 TR) 1000 MCG TBCR Take 1 tablet by mouth daily.    "not in a couple days"  . cyclobenzaprine (FLEXERIL) 5 MG tablet Take 1 tablet (5 mg total) by mouth every 8 (eight) hours as needed for muscle spasms. (Patient not taking: Reported on 01/31/2016) 20 tablet 0 Not Taking at Unknown time  . HYDROcodone-acetaminophen (NORCO) 5-325 MG tablet Take 1 tablet by mouth every 6 (six) hours as needed for moderate pain. 30 tablet 0 "a while ago"  . Insulin Pen Needle (PEN NEEDLES 31GX5/16") 31G X 8 MM MISC Inject  into the skin.   Taking  . Insulin Pen Needle (SURE COMFORT PEN NEEDLES) 31G X 5 MM MISC USE 4 TIMES A DAY OR AS DIRECTED BY DOCTOR   Taking  . metFORMIN (GLUCOPHAGE) 1000 MG tablet twice a day   01/28/2016 at am  . vitamin E 1000 UNIT capsule Take 1,000 Units by mouth daily.    "not in couple days"  . vitamin E 400 UNIT capsule Take 400 Units by mouth every morning.   "not in couple days:"   Allergies:  Allergies  Allergen Reactions  . Ibuprofen Itching, Nausea Only and Other (See Comments)    Family History  Problem Relation Age of Onset  . Liver cancer Mother   . Colon cancer Mother   . Breast cancer Mother   . Diabetes Daughter   . Kidney disease Daughter     adrenal tumors  . Kidney cancer Neg Hx   . Prostate cancer Neg Hx    Social History:  reports that she has never smoked. She has never used smokeless tobacco. She reports that she does not drink alcohol or use illicit drugs.  Review of Systems  Genitourinary: Positive for urgency, frequency and flank pain.  All other systems reviewed and are negative.   Physical Exam:  Vital signs in last 24 hours: Temp:  [96 F (35.6 C)] 96 F (35.6 C) (06/27 0625) Pulse Rate:  [82] 82 (06/27 0625) Resp:  [16] 16 (06/27 0625) BP: (144)/(77) 144/77 mmHg (06/27 0625) SpO2:  [100 %] 100 % (06/27 0625) Weight:  [72.576 kg (160 lb)] 72.576 kg (160 lb) (06/27 [id number]) Physical Exam  Constitutional: She is oriented to person, place, and time. She appears well-developed and well-nourished.  HENT:  Head: Normocephalic and atraumatic.  Eyes: EOM are normal. Pupils are equal, round, and reactive to light.  Neck: Normal range of motion. No thyromegaly present.  Cardiovascular: Normal rate and regular rhythm.   Respiratory: Effort normal. No respiratory distress.  GI: Soft. She exhibits no distension.  Musculoskeletal: Normal range of motion.  Neurological: She is alert and oriented to person, place, and time.  Skin: Skin is warm and dry.   Psychiatric: She has a normal mood and affect. Her behavior is normal. Judgment and thought content normal.    Laboratory Data:  Results for orders placed or performed during the hospital encounter of 01/31/16 (from the past 24 hour(s))  Glucose, capillary     Status: Abnormal   Collection Time: 01/31/16  6:16 AM  Result Value  Ref Range   Glucose-Capillary 209 (H) 65 - 99 mg/dL   Recent Results (from the past 240 hour(s))  CULTURE, URINE COMPREHENSIVE     Status: None   Collection Time: 01/23/16  9:33 AM  Result Value Ref Range Status   Urine Culture, Comprehensive Final report  Final   Result 1 Comment  Final    Comment: No growth in 36 - 48 hours.  Microscopic Examination     Status: Abnormal   Collection Time: 01/23/16  9:33 AM  Result Value Ref Range Status   WBC, UA 0-5 0 -  5 /hpf Final   RBC, UA 3-10 (A) 0 -  2 /hpf Final   Epithelial Cells (non renal) 0-10 0 - 10 /hpf Final   Bacteria, UA None seen None seen/Few Final   Creatinine: No results for input(s): CREATININE in the last 168 hours. Baseline Creatinine: unknwon  Impression/Assessment:  70yo with right UPJ obstruction  Plan:  The risks/benefits/alternatives to right ureteral stent exchanged was discussed with the patient and family and they understand and wish to proceed with surgery  Lori Blanchard 01/31/2016, 7:28 AM

## 2016-01-31 NOTE — Discharge Instructions (Signed)
Ureteral Stent Implantation, Care After Refer to this sheet in the next few weeks. These instructions provide you with information on caring for yourself after your procedure. Your health care provider may also give you more specific instructions. Your treatment has been planned according to current medical practices, but problems sometimes occur. Call your health care provider if you have any problems or questions after your procedure. WHAT TO EXPECT AFTER THE PROCEDURE You should be back to normal activity within 48 hours after the procedure. Nausea and vomiting may occur and are commonly the result of anesthesia. It is common to experience sharp pain in the back or lower abdomen and penis with voiding. This is caused by movement of the ends of the stent with the act of urinating.It usually goes away within minutes after you have stopped urinating. HOME CARE INSTRUCTIONS Make sure to drink plenty of fluids. You may have small amounts of bleeding, causing your urine to be red. This is normal. Certain movements may trigger pain or a feeling that you need to urinate. You may be given medicines to prevent infection or bladder spasms. Be sure to take all medicines as directed. Only take over-the-counter or prescription medicines for pain, discomfort, or fever as directed by your health care provider. Do not take aspirin, as this can make bleeding worse. Your stent will be left in until the blockage is resolved. This may take 2 weeks or longer, depending on the reason for stent implantation. You may have an X-ray exam to make sure your ureter is open and that the stent has not moved out of position (migrated). The stent can be removed by your health care provider in the office. Medicines may be given for comfort while the stent is being removed. Be sure to keep all follow-up appointments so your health care provider can check that you are healing properly. SEEK MEDICAL CARE IF:  You experience increasing  pain.  Your pain medicine is not working. SEEK IMMEDIATE MEDICAL CARE IF:  Your urine is dark red or has blood clots.  You are leaking urine (incontinent).  You have a fever, chills, feeling sick to your stomach (nausea), or vomiting.  Your pain is not relieved by pain medicine.  The end of the stent comes out of the urethra.  You are unable to urinate.   This information is not intended to replace advice given to you by your health care provider. Make sure you discuss any questions you have with your health care provider.   Document Released: 03/25/2013 Document Revised: 07/28/2013 Document Reviewed: 02/04/2015 Elsevier Interactive Patient Education 2016 [place]   1) The drugs that you were given will stay in your system until tomorrow so for the next 24 hours you should not:  A) Drive an automobile B) Make any legal decisions C) Drink any alcoholic beverage   2) You may resume regular meals tomorrow.  Today it is better to start with liquids and gradually work up to solid foods.  You may eat anything you prefer, but it is better to start with liquids, then soup and crackers, and gradually work up to solid foods.   3) Please notify your doctor immediately if you have any unusual bleeding, trouble breathing, redness and pain at the surgery site, drainage, fever, or pain not relieved by medication.    4) Additional Instructions:        Please contact your physician with any problems or Same Day Surgery  at [phone], Monday through Friday 6 am to 4 pm, or [place] at [facility] number at [phone].

## 2016-01-31 NOTE — Anesthesia Procedure Notes (Signed)
Procedure Name: LMA Insertion Date/Time: 01/31/2016 8:06 AM Performed by: Timoteo Expose Pre-anesthesia Checklist: Patient identified, Emergency Drugs available, Suction available, Patient being monitored and Timeout performed Patient Re-evaluated:Patient Re-evaluated prior to inductionOxygen Delivery Method: Circle system utilized Preoxygenation: Pre-oxygenation with 100% oxygen Intubation Type: IV induction Ventilation: Mask ventilation without difficulty LMA: LMA inserted LMA Size: 4.0 Number of attempts: 1

## 2016-01-31 NOTE — Addendum Note (Signed)
Addendum  created 01/31/16 1028 by Timoteo Expose, CRNA   Modules edited: Anesthesia Flowsheet

## 2016-01-31 NOTE — Op Note (Signed)
.  Preoperative diagnosis: right UPJ stone, sepsis  Postoperative diagnosis: Same  Procedure: 1 cystoscopy 2. right retrograde pyelography 3.  Intraoperative fluoroscopy, under one hour, with interpretation 4. right 6 x 24 JJ stent exchange  Attending: Nicolette Bang  Anesthesia: General  Estimated blood loss: None  Drains: Right 6 x 24 JJ ureteral stent without tether  Specimens: none  Antibiotics: Ancef  Findings: right UPJ obstruction. Moderate hydronephrosis. No masses/lesions in the bladder. Ureteral orifices in normal anatomic location. Mild encrustion of the distal end of the stent  Indications: Patient is a 70 year old female with a history of right UPJ obstruction managed with chronic stents.  After discussing treatment options, they decided proceed with right stent exchange.  Procedure her in detail: The patient was brought to the operating room and a brief timeout was done to ensure correct patient, correct procedure, correct site.  General anesthesia was administered patient was placed in dorsal lithotomy position.  Their genitalia was then prepped and draped in usual sterile fashion.  A rigid 71 French cystoscope was passed in the urethra and the bladder.  Bladder was inspected free masses or lesions.  the ureteral orifices were in the normal orthotopic locations.  Using a grasper the stent was brought to the urethral meatus. A sensor wire was then advanced through the stent and up to the renal pelvis. a 6 french ureteral catheter was then instilled into the right ureteral orifice.  a gentle retrograde was obtained and findings noted above.  we then placed a zip wire through the ureteral catheter and advanced up to the renal pelvis.    We then placed a 6 x 24 double-j ureteral stent over the sensor wire.  We then removed the wire and good coil was noted in the the renal pelvis under fluoroscopy and the bladder under direct vision.  the bladder was then drained and this concluded  the procedure which was well tolerated by patient.  Complications: None  Condition: Stable, extubated, transferred to PACU  Plan: Patient is to be discharged home. She will followup in 1 month and then be scheduled in 3 months for stent exchange

## 2016-02-10 ENCOUNTER — Other Ambulatory Visit: Payer: Self-pay | Admitting: Internal Medicine

## 2016-02-10 DIAGNOSIS — Z1231 Encounter for screening mammogram for malignant neoplasm of breast: Secondary | ICD-10-CM

## 2016-02-20 ENCOUNTER — Other Ambulatory Visit: Payer: Self-pay | Admitting: *Deleted

## 2016-02-20 ENCOUNTER — Inpatient Hospital Stay
Admission: RE | Admit: 2016-02-20 | Discharge: 2016-02-20 | Disposition: A | Discharge status: Home / Self Care | Payer: Self-pay | Source: Ambulatory Visit | Attending: *Deleted | Admitting: *Deleted

## 2016-02-20 DIAGNOSIS — Z9289 Personal history of other medical treatment: Secondary | ICD-10-CM

## 2016-02-28 ENCOUNTER — Ambulatory Visit: Payer: Medicare Other

## 2016-02-29 ENCOUNTER — Ambulatory Visit (INDEPENDENT_AMBULATORY_CARE_PROVIDER_SITE_OTHER): Payer: Medicare Other | Admitting: Urology

## 2016-02-29 VITALS — BP 149/72 | HR 90 | Ht <= 58 in | Wt 160.7 lb

## 2016-02-29 DIAGNOSIS — N200 Calculus of kidney: Secondary | ICD-10-CM | POA: Diagnosis not present

## 2016-02-29 DIAGNOSIS — N135 Crossing vessel and stricture of ureter without hydronephrosis: Secondary | ICD-10-CM | POA: Diagnosis not present

## 2016-02-29 LAB — URINALYSIS, COMPLETE
Bilirubin, UA: NEGATIVE
GLUCOSE, UA: NEGATIVE
KETONES UA: NEGATIVE
NITRITE UA: NEGATIVE
PH UA: 6 (ref 5.0–7.5)
Protein, UA: NEGATIVE
SPEC GRAV UA: 1.01 (ref 1.005–1.030)
Urobilinogen, Ur: 0.2 mg/dL (ref 0.2–1.0)

## 2016-02-29 NOTE — Progress Notes (Signed)
02/29/2016 10:15 AM   Lori Blanchard 04-May-1946 [id number]  Referring provider: [name], MD [place], [place] [address]  Chief Complaint  Patient presents with  . Follow-up    Ureteropelvic junction (UPJ) obstruction    HPI: The patient is a 70 year old female with a chronic right symptomatic UPJ obstruction that is treated with an indwelling ureteral stent given that she is of course located. She presents today to discuss planning for right ureteral stent change. She is due for stent exchange in late September 2017.  She also has a 6 mm nonobstructing left upper pole stone.  She has tried to have a right ureteral stent removed on multiple occasions. Shows develop symptomatic hydronephrosis or sepsis. She also developed as her stent is not changed every 3 months.   PMH: Past Medical History:  Diagnosis Date  . Alzheimer disease   . Anxiety and depression   . Asthma   . Diabetes ([place])   . Family history of adverse reaction to anesthesia    daughter has breathing problems after anesthesia  . Gout   . Hydronephrosis   . Hyperlipemia   . Hypertension   . Hypothyroidism   . Osteoarthritis   . Osteoarthritis   . Pulmonary nodule   . Retroperitoneal fibrosis   . Sleep apnea     Surgical History: Past Surgical History:  Procedure Laterality Date  . ABDOMINAL HYSTERECTOMY     partial  . BREAST SURGERY Bilateral 1990   Reduction  . CARPAL TUNNEL RELEASE    . CESAREAN SECTION     x 3  . CYSTOSCOPY W/ RETROGRADES Right 11/02/2015   Procedure: CYSTOSCOPY WITH RETROGRADE PYELOGRAM, URETERAL STENT EXCHANGE;  Surgeon: [name], MD;  Location: [facility];  Service: Urology;  Laterality: Right;  . CYSTOSCOPY W/ RETROGRADES Right 01/31/2016   Procedure: CYSTOSCOPY WITH RETROGRADE PYELOGRAM;  Surgeon: [name], MD;  Location: [facility];  Service: Urology;  Laterality: Right;  . CYSTOSCOPY W/ URETERAL STENT  PLACEMENT Right 05/11/2015   Procedure: CYSTOSCOPY WITH STENT REPLACEMENT;  Surgeon: [name], MD;  Location: [facility];  Service: Urology;  Laterality: Right;  . CYSTOSCOPY W/ URETERAL STENT PLACEMENT Right 01/31/2016   Procedure: CYSTOSCOPY, RETROGRADE PYELOGRAMS WITH STENT REPLACEMENT;  Surgeon: [name], MD;  Location: [facility];  Service: Urology;  Laterality: Right;  . HERNIA REPAIR     umbilical  . JOINT REPLACEMENT Left 2013   TKR  . JOINT REPLACEMENT Right 2011   TKR  . LAPAROSCOPIC HYSTERECTOMY    . REPLACEMENT TOTAL KNEE Bilateral     Home Medications:    Medication List       Accurate as of 02/29/16 10:15 AM. Always use your most recent med list.          albuterol (2.5 MG/3ML) 0.083% nebulizer solution Commonly known as:  PROVENTIL Inhale 2.5 mg into the lungs every 4 (four) hours as needed for wheezing or shortness of breath.   PROAIR HFA 108 (90 Base) MCG/ACT inhaler Generic drug:  albuterol USE 2 PUFFS EVERY FOUR HOURS AS NEEDED.   alendronate 70 MG tablet Commonly known as:  FOSAMAX TAKE ONE TABLET EVERY WEEK. TAKE WITH A FUUL GLASS OF WATER AND DO NOT LIE DOWN FOR THE NEXT 30 MINUTES.  usually on Friday or Saturday.   aspirin EC 81 MG tablet Take 81 mg by mouth every morning.   atorvastatin 40 MG tablet Commonly known  as:  LIPITOR Take 40 mg by mouth every morning.   buPROPion 150 MG 24 hr tablet Commonly known as:  WELLBUTRIN XL Take 150 mg by mouth every morning.   cyclobenzaprine 5 MG tablet Commonly known as:  FLEXERIL Take 1 tablet (5 mg total) by mouth every 8 (eight) hours as needed for muscle spasms.   donepezil 5 MG tablet Commonly known as:  ARICEPT Take 5 mg by mouth daily.   fenofibrate 145 MG tablet Commonly known as:  TRICOR TAKE ONE (1) TABLET EACH DAY in am   furosemide 20 MG tablet Commonly known as:  LASIX Take 20 mg by mouth every morning.   HYDROcodone-acetaminophen 5-325 MG tablet Commonly known as:   NORCO Take 1 tablet by mouth every 6 (six) hours as needed for moderate pain.   insulin lispro 100 UNIT/ML KiwkPen Commonly known as:  HUMALOG Inject 10 Units into the skin 3 (three) times daily.   LANTUS SOLOSTAR 100 UNIT/ML Solostar Pen Generic drug:  Insulin Glargine Inject 20 Units into the skin daily at 10 pm.   levothyroxine 50 MCG tablet Commonly known as:  SYNTHROID, LEVOTHROID TAKE 1 TABLET ON AN EMPTY STOMACH AT LEAST 30-60 MINUTES BEFORE BREAKFAST   metFORMIN 1000 MG tablet Commonly known as:  GLUCOPHAGE twice a day   pantoprazole 40 MG tablet Commonly known as:  PROTONIX Take 40 mg by mouth every morning.   PEN NEEDLES 31GX5/16" 31G X 8 MM Misc Inject into the skin.   SURE COMFORT PEN NEEDLES 31G X 5 MM Misc Generic drug:  Insulin Pen Needle USE 4 TIMES A DAY OR AS DIRECTED BY DOCTOR   RA VITAMIN B-12 TR 1000 MCG Tbcr Generic drug:  Cyanocobalamin Take 1 tablet by mouth daily.   senna 8.6 MG tablet Commonly known as:  SENOKOT Take 1 tablet by mouth 2 (two) times daily. At 8 am and 5 pm   telmisartan 40 MG tablet Commonly known as:  MICARDIS TAKE ONE (1) TABLET EACH DAY in am   vitamin E 1000 UNIT capsule Take 1,000 Units by mouth daily.   vitamin E 400 UNIT capsule Take 400 Units by mouth every morning.       Allergies:  Allergies  Allergen Reactions  . Ibuprofen Itching, Nausea Only and Other (See Comments)    Family History: Family History  Problem Relation Age of Onset  . Liver cancer Mother   . Colon cancer Mother   . Breast cancer Mother   . Diabetes Daughter   . Kidney disease Daughter     adrenal tumors  . Kidney cancer Neg Hx   . Prostate cancer Neg Hx     Social History:  reports that she has never smoked. She has never used smokeless tobacco. She reports that she does not drink alcohol or use drugs.  ROS: UROLOGY Frequent Urination?: No Hard to postpone urination?: No Burning/pain with urination?: No Get up at night to  urinate?: Yes Leakage of urine?: No Urine stream starts and stops?: No Trouble starting stream?: No Do you have to strain to urinate?: No Blood in urine?: No Urinary tract infection?: No Sexually transmitted disease?: No Injury to kidneys or bladder?: No Painful intercourse?: No Weak stream?: No Currently pregnant?: No Vaginal bleeding?: No Last menstrual period?: No  Gastrointestinal Nausea?: No Vomiting?: No Indigestion/heartburn?: No Diarrhea?: No Constipation?: No  Constitutional Fever: No Night sweats?: No Weight loss?: No Fatigue?: No  Skin Skin rash/lesions?: No Itching?: No  Eyes Blurred vision?: No  Double vision?: No  Ears/Nose/Throat Sore throat?: No Sinus problems?: No  Hematologic/Lymphatic Swollen glands?: No Easy bruising?: No  Cardiovascular Leg swelling?: Yes Chest pain?: No  Respiratory Cough?: No Shortness of breath?: Yes  Endocrine Excessive thirst?: No  Musculoskeletal Back pain?: No Joint pain?: Yes  Neurological Headaches?: Yes Dizziness?: No  Psychologic Depression?: Yes Anxiety?: Yes  Physical Exam: BP (!) 149/72 (BP Location: Left Arm, Patient Position: Sitting, Cuff Size: Normal)   Pulse 90   Ht 4\' 8"  (1.422 m)   Wt 160 lb 11.2 oz (72.9 kg)   BMI 36.03 kg/m   Constitutional:  Alert and oriented, No acute distress. HEENT: [place] AT, moist mucus membranes.  Trachea midline, no masses. Cardiovascular: No clubbing, cyanosis, or edema. Respiratory: Normal respiratory effort, no increased work of breathing. GI: Abdomen is soft, nontender, nondistended, no abdominal masses GU: No CVA tenderness.  Skin: No rashes, bruises or suspicious lesions. Lymph: No cervical or inguinal adenopathy. Neurologic: Grossly intact, no focal deficits, moving all 4 extremities. Psychiatric: Normal mood and affect.  Laboratory Data: Lab Results  Component Value Date   WBC 7.2 12/29/2015   HGB 10.5 (L) 12/29/2015   HCT 33.2 (L)  12/29/2015   MCV 74.1 (L) 12/29/2015   PLT 286 12/29/2015    Lab Results  Component Value Date   CREATININE 0.90 12/29/2015    No results found for: PSA  No results found for: TESTOSTERONE  No results found for: HGBA1C  Urinalysis    Component Value Date/Time   COLORURINE YELLOW (A) 01/18/2016 1106   APPEARANCEUR Clear 01/23/2016 0933   LABSPEC 1.016 01/18/2016 1106   LABSPEC 1.015 08/08/2011 1106   PHURINE 6.0 01/18/2016 1106   GLUCOSEU Negative 01/23/2016 0933   GLUCOSEU Negative 08/08/2011 1106   HGBUR 1+ (A) 01/18/2016 1106   BILIRUBINUR Negative 01/23/2016 0933   BILIRUBINUR Negative 08/08/2011 1106   KETONESUR NEGATIVE 01/18/2016 1106   PROTEINUR Negative 01/23/2016 0933   PROTEINUR NEGATIVE 01/18/2016 1106   NITRITE Negative 01/23/2016 0933   NITRITE NEGATIVE 01/18/2016 1106   LEUKOCYTESUR Trace (A) 01/23/2016 0933   LEUKOCYTESUR Negative 08/08/2011 1106    Pertinent Imaging: CLINICAL DATA:  History of right renal stent.  EXAM: CT ABDOMEN AND PELVIS WITH CONTRAST  TECHNIQUE: Multidetector CT imaging of the abdomen and pelvis was performed using the standard protocol following bolus administration of intravenous contrast.  CONTRAST:  138mL ISOVUE-300 IOPAMIDOL (ISOVUE-300) INJECTION 61%  COMPARISON:  11/28/2012  FINDINGS: Lower chest: Subpleural fibrotic changes are seen in both lung bases. Mitral valve annulus calcifications are noted.  Hepatobiliary: No masses or other significant abnormality.  Pancreas: No mass, inflammatory changes, or other significant abnormality.  Spleen: Within normal limits in size and appearance.  Adrenals/Urinary Tract: There is a 6 mm obstructing left upper pole renal calculus. The left kidney is otherwise normal. There is a moderate right hydronephrosis. Right ureteral stent is seen with proximal tip within the external pelvis, which is dilated. The distal end is located within the urinary bladder. The  right ureter is decompressed. Moderate right perinephric fat stranding is noted.  Stomach/Bowel: No evidence of obstruction, inflammatory process, or abnormal fluid collections.  Vascular/Lymphatic: No pathologically enlarged lymph nodes. No evidence of abdominal aortic aneurysm.  Reproductive: No mass or other significant abnormality, post hysterectomy.  Other: None.  Musculoskeletal: No suspicious bone lesions identified. Multilevel osteoarthritic changes of the spine are noted.  IMPRESSION: Right ureteral stent with proximal tip within dilated external pelvis and distal tip within the urinary  bladder. Associated moderate right hydronephrosis and inflammatory perirenal fat stranding.  Nonobstructive left nephrolithiasis.    Assessment & Plan:    1. Right UPJ obstruction Continue every 3 month ureteral stent exchanges in the operating room. She was scheduled for this in late September.   2. Left renal stone Cystoscopy was 6 mm in size and may become symptomatically at some point. We discussed that since she is artery going to the operating room in a few months for ureteral stent exchange that we could address this at that time. The patient is agreeable to also undergoing left ureteroscopy, laser lithotripsy, left ureteral stent placement. She understands risks, benefits and indications of ureteroscopy. She understands and may take more than one procedure. She also understands she'll also list on the left side temporarily. All questions were answered. The patient has elected to proceed   Return for surgery.  [name], MD  [facility] [address], [place], [place] [address] [phone]

## 2016-03-02 ENCOUNTER — Other Ambulatory Visit: Payer: Self-pay | Admitting: Internal Medicine

## 2016-03-02 ENCOUNTER — Ambulatory Visit
Admission: RE | Admit: 2016-03-02 | Discharge: 2016-03-02 | Disposition: A | Discharge status: Home / Self Care | Payer: Medicare Other | Source: Ambulatory Visit | Attending: Internal Medicine | Admitting: Internal Medicine

## 2016-03-02 DIAGNOSIS — R21 Rash and other nonspecific skin eruption: Secondary | ICD-10-CM

## 2016-03-02 DIAGNOSIS — Z1231 Encounter for screening mammogram for malignant neoplasm of breast: Secondary | ICD-10-CM

## 2016-03-16 ENCOUNTER — Ambulatory Visit
Admission: RE | Admit: 2016-03-16 | Discharge: 2016-03-16 | Disposition: A | Discharge status: Home / Self Care | Payer: Medicare Other | Source: Ambulatory Visit | Attending: Internal Medicine | Admitting: Internal Medicine

## 2016-03-16 DIAGNOSIS — R921 Mammographic calcification found on diagnostic imaging of breast: Secondary | ICD-10-CM | POA: Diagnosis not present

## 2016-03-16 DIAGNOSIS — R21 Rash and other nonspecific skin eruption: Secondary | ICD-10-CM | POA: Diagnosis present

## 2016-04-13 ENCOUNTER — Telehealth: Payer: Self-pay | Admitting: Radiology

## 2016-04-13 NOTE — Telephone Encounter (Signed)
Notified pt of surgery scheduled with Dr Pilar Jarvis on 05/09/16, RTC for ucx on 04/25/16 at 9:30, pre-admit testing appt on 04/25/16 @10 :45 & to call day prior to surgery for arrival time to [facility]. Advised pt to continue ASA 81mg  as prescribed per Dr Pilar Jarvis. Pt voices understanding.

## 2016-04-16 ENCOUNTER — Other Ambulatory Visit: Payer: Self-pay | Admitting: Radiology

## 2016-04-16 DIAGNOSIS — N200 Calculus of kidney: Secondary | ICD-10-CM

## 2016-04-16 DIAGNOSIS — N201 Calculus of ureter: Secondary | ICD-10-CM

## 2016-04-25 ENCOUNTER — Encounter
Admission: RE | Admit: 2016-04-25 | Discharge: 2016-04-25 | Disposition: A | Discharge status: Home / Self Care | Payer: Medicare Other | Source: Ambulatory Visit | Attending: Urology | Admitting: Urology

## 2016-04-25 ENCOUNTER — Ambulatory Visit (INDEPENDENT_AMBULATORY_CARE_PROVIDER_SITE_OTHER): Payer: Medicare Other

## 2016-04-25 VITALS — BP 146/78 | HR 98 | Ht <= 58 in | Wt 160.9 lb

## 2016-04-25 DIAGNOSIS — Z01818 Encounter for other preprocedural examination: Secondary | ICD-10-CM | POA: Insufficient documentation

## 2016-04-25 DIAGNOSIS — E119 Type 2 diabetes mellitus without complications: Secondary | ICD-10-CM | POA: Diagnosis not present

## 2016-04-25 DIAGNOSIS — I10 Essential (primary) hypertension: Secondary | ICD-10-CM | POA: Insufficient documentation

## 2016-04-25 DIAGNOSIS — J449 Chronic obstructive pulmonary disease, unspecified: Secondary | ICD-10-CM | POA: Diagnosis not present

## 2016-04-25 DIAGNOSIS — N390 Urinary tract infection, site not specified: Secondary | ICD-10-CM

## 2016-04-25 HISTORY — DX: Depression, unspecified: F32.A

## 2016-04-25 HISTORY — DX: Major depressive disorder, single episode, unspecified: F32.9

## 2016-04-25 LAB — CBC
HEMATOCRIT: 35.8 % (ref 35.0–47.0)
HEMOGLOBIN: 11.4 g/dL — AB (ref 12.0–16.0)
MCH: 24.3 pg — AB (ref 26.0–34.0)
MCHC: 31.7 g/dL — AB (ref 32.0–36.0)
MCV: 76.6 fL — ABNORMAL LOW (ref 80.0–100.0)
Platelets: 322 10*3/uL (ref 150–440)
RBC: 4.68 MIL/uL (ref 3.80–5.20)
RDW: 15.1 % — AB (ref 11.5–14.5)
WBC: 8.8 10*3/uL (ref 3.6–11.0)

## 2016-04-25 LAB — MICROSCOPIC EXAMINATION: BACTERIA UA: NONE SEEN

## 2016-04-25 LAB — COMPREHENSIVE METABOLIC PANEL
ALBUMIN: 4.1 g/dL (ref 3.5–5.0)
ALK PHOS: 51 U/L (ref 38–126)
ALT: 17 U/L (ref 14–54)
ANION GAP: 8 (ref 5–15)
AST: 30 U/L (ref 15–41)
BILIRUBIN TOTAL: 0.8 mg/dL (ref 0.3–1.2)
BUN: 20 mg/dL (ref 6–20)
CALCIUM: 9.2 mg/dL (ref 8.9–10.3)
CO2: 27 mmol/L (ref 22–32)
Chloride: 106 mmol/L (ref 101–111)
Creatinine, Ser: 1.17 mg/dL — ABNORMAL HIGH (ref 0.44–1.00)
GFR calc Af Amer: 53 mL/min — ABNORMAL LOW (ref >60)
GFR, EST NON AFRICAN AMERICAN: 46 mL/min — AB (ref >60)
GLUCOSE: 181 mg/dL — AB (ref 65–99)
POTASSIUM: 3.8 mmol/L (ref 3.5–5.1)
Sodium: 141 mmol/L (ref 135–145)
TOTAL PROTEIN: 7.3 g/dL (ref 6.5–8.1)

## 2016-04-25 LAB — URINALYSIS, COMPLETE
Bilirubin, UA: NEGATIVE
GLUCOSE, UA: NEGATIVE
KETONES UA: NEGATIVE
NITRITE UA: NEGATIVE
Protein, UA: NEGATIVE
RBC, UA: NEGATIVE
SPEC GRAV UA: 1.015 (ref 1.005–1.030)
Urobilinogen, Ur: 1 mg/dL (ref 0.2–1.0)
pH, UA: 6.5 (ref 5.0–7.5)

## 2016-04-25 NOTE — Progress Notes (Signed)
In and Out Catheterization  Patient is present today for a I & O catheterization due to stent exchange. Patient was cleaned and prepped in a sterile fashion with betadine and Lidocaine 2% jelly was instilled into the urethra.  A 14FR cath was inserted no complications were noted , 154ml of urine return was noted, urine was yellow in color. A clean urine sample was collected for u/a and cx. Bladder was drained  And catheter was removed with out difficulty.    Preformed by: Toniann Fail, LPN   Blood pressure (!) 146/78, pulse 98, height 4\' 9"  (1.448 m), weight 160 lb 14.4 oz (73 kg).

## 2016-04-25 NOTE — Patient Instructions (Signed)
  Your procedure is scheduled on:Wednesday Oct. 4 , 2017. Report to Same Day Surgery. To find out your arrival time please call [phone] between 1PM - 3PM on Tuesday Oct.3, 2017.  Remember: Instructions that are not followed completely may result in serious medical risk, up to and including death, or upon the discretion of your surgeon and anesthesiologist your surgery may need to be rescheduled.    _x___ 1. Do not eat food or drink liquids after midnight. No gum chewing or hard candies.     ____ 2. No Alcohol for 24 hours before or after surgery.   ____ 3. Bring all medications with you on the day of surgery if instructed.    __x__ 4. Notify your doctor if there is any change in your medical condition     (cold, fever, infections).    _____ 5. No smoking 24 hours prior to surgery.     Do not wear jewelry, make-up, hairpins, clips or nail polish.  Do not wear lotions, powders, or perfumes.   Do not shave 48 hours prior to surgery. Men may shave face and neck.  Do not bring valuables to the hospital.    [facility] is not responsible for any belongings or valuables.               Contacts, dentures or bridgework may not be worn into surgery.  Leave your suitcase in the car. After surgery it may be brought to your room.  For patients admitted to the hospital, discharge time is determined by your treatment team.   Patients discharged the day of surgery will not be allowed to drive home.    Please read over the following fact sheets that you were given:   [facility] Preparing for Surgery  ____ Take these medicines the morning of surgery with A SIP OF WATER:    1. atorvastatin (LIPITOR)  2. buPROPion (WELLBUTRIN XL)   3. donepezil (ARICEPT)  4. fenofibrate (TRICOR)  5. levothyroxine (SYNTHROID, LEVOTHROID)   6. pantoprazole (PROTONIX)  7. telmisartan (MICARDIS  ____ Fleet Enema (as directed)   ____ Use CHG Soap as directed on instruction sheet  _x___ Use inhalers on  the day of surgery and bring to hospital day of surgery  _x___ Stop metformin 2 days prior to surgery on May 07, 2016.    _x___ Take 1/2 of usual insulin dose the night before surgery and none on the morning of surgery.   ____ Stop Coumadin/Plavix/aspirin on does not apply.  _x___ Stop Anti-inflammatories such as Advil, Aleve, Ibuprofen, Motrin, Naproxen, Naprosyn, Goodies powders or aspirin products. OK to take Tylenol.   _x__ Stop supplements Vitamin B and E until after surgery.    _x___ Bring C-Pap to the hospital.

## 2016-04-25 NOTE — Pre-Procedure Instructions (Signed)
Clearance from Dr. Raul Del is on chart per Dr. Ramon Dredge office.  Pt is at high risk, informed Dr. Ronelle Nigh of this and that the pt has had this same procedure 5 or more times in the past, last being in June 2017 per pt report.  OK to proceed per Dr. Ronelle Nigh.

## 2016-04-28 LAB — CULTURE, URINE COMPREHENSIVE

## 2016-05-08 MED ORDER — CEFAZOLIN SODIUM-DEXTROSE 2-4 GM/100ML-% IV SOLN
2.0000 g | INTRAVENOUS | Status: AC
  Administered 2016-05-09: 2 g via INTRAVENOUS

## 2016-05-09 ENCOUNTER — Ambulatory Visit: Payer: Medicare Other | Admitting: Certified Registered"

## 2016-05-09 ENCOUNTER — Encounter
Admission: RE | Disposition: A | Discharge status: Home / Self Care | Payer: Self-pay | Source: Ambulatory Visit | Attending: Urology

## 2016-05-09 ENCOUNTER — Encounter: Payer: Self-pay | Admitting: *Deleted

## 2016-05-09 ENCOUNTER — Ambulatory Visit
Admission: RE | Admit: 2016-05-09 | Discharge: 2016-05-09 | Disposition: A | Discharge status: Home / Self Care | Payer: Medicare Other | Source: Ambulatory Visit | Attending: Urology | Admitting: Urology

## 2016-05-09 DIAGNOSIS — N200 Calculus of kidney: Secondary | ICD-10-CM | POA: Diagnosis not present

## 2016-05-09 DIAGNOSIS — Z79899 Other long term (current) drug therapy: Secondary | ICD-10-CM | POA: Insufficient documentation

## 2016-05-09 DIAGNOSIS — G309 Alzheimer's disease, unspecified: Secondary | ICD-10-CM | POA: Diagnosis not present

## 2016-05-09 DIAGNOSIS — Z96653 Presence of artificial knee joint, bilateral: Secondary | ICD-10-CM | POA: Diagnosis not present

## 2016-05-09 DIAGNOSIS — M1990 Unspecified osteoarthritis, unspecified site: Secondary | ICD-10-CM | POA: Insufficient documentation

## 2016-05-09 DIAGNOSIS — N189 Chronic kidney disease, unspecified: Secondary | ICD-10-CM | POA: Diagnosis not present

## 2016-05-09 DIAGNOSIS — N135 Crossing vessel and stricture of ureter without hydronephrosis: Secondary | ICD-10-CM | POA: Insufficient documentation

## 2016-05-09 DIAGNOSIS — F0280 Dementia in other diseases classified elsewhere without behavioral disturbance: Secondary | ICD-10-CM | POA: Insufficient documentation

## 2016-05-09 DIAGNOSIS — N202 Calculus of kidney with calculus of ureter: Secondary | ICD-10-CM | POA: Diagnosis not present

## 2016-05-09 DIAGNOSIS — E785 Hyperlipidemia, unspecified: Secondary | ICD-10-CM | POA: Diagnosis not present

## 2016-05-09 DIAGNOSIS — I129 Hypertensive chronic kidney disease with stage 1 through stage 4 chronic kidney disease, or unspecified chronic kidney disease: Secondary | ICD-10-CM | POA: Diagnosis not present

## 2016-05-09 DIAGNOSIS — G4730 Sleep apnea, unspecified: Secondary | ICD-10-CM | POA: Diagnosis not present

## 2016-05-09 DIAGNOSIS — Z794 Long term (current) use of insulin: Secondary | ICD-10-CM | POA: Insufficient documentation

## 2016-05-09 DIAGNOSIS — K219 Gastro-esophageal reflux disease without esophagitis: Secondary | ICD-10-CM | POA: Insufficient documentation

## 2016-05-09 DIAGNOSIS — E039 Hypothyroidism, unspecified: Secondary | ICD-10-CM | POA: Insufficient documentation

## 2016-05-09 DIAGNOSIS — N201 Calculus of ureter: Secondary | ICD-10-CM

## 2016-05-09 HISTORY — PX: CYSTOSCOPY WITH STENT PLACEMENT: SHX5790

## 2016-05-09 HISTORY — PX: CYSTOSCOPY W/ RETROGRADES: SHX1426

## 2016-05-09 HISTORY — PX: CYSTOSCOPY W/ URETERAL STENT PLACEMENT: SHX1429

## 2016-05-09 HISTORY — PX: URETEROSCOPY: SHX842

## 2016-05-09 LAB — GLUCOSE, CAPILLARY
GLUCOSE-CAPILLARY: 175 mg/dL — AB (ref 65–99)
Glucose-Capillary: 189 mg/dL — ABNORMAL HIGH (ref 65–99)

## 2016-05-09 SURGERY — CYSTOSCOPY, FLEXIBLE, WITH STENT REPLACEMENT
Anesthesia: General | Laterality: Right

## 2016-05-09 MED ORDER — SODIUM CHLORIDE 0.9 % IV SOLN
INTRAVENOUS | Status: DC
  Administered 2016-05-09: 07:00:00 via INTRAVENOUS

## 2016-05-09 MED ORDER — PROPOFOL 10 MG/ML IV BOLUS
INTRAVENOUS | Status: DC | PRN
Start: 2016-05-09 — End: 2016-05-09
  Administered 2016-05-09: 140 mg via INTRAVENOUS

## 2016-05-09 MED ORDER — CEPHALEXIN 500 MG PO CAPS: 500.0000 mg | ORAL_CAPSULE | Freq: Three times a day (TID) | ORAL | 0 refills | Status: DC

## 2016-05-09 MED ORDER — PHENYLEPHRINE HCL 10 MG/ML IJ SOLN
INTRAMUSCULAR | Status: DC | PRN
  Administered 2016-05-09: 100 ug via INTRAVENOUS

## 2016-05-09 MED ORDER — FENTANYL CITRATE (PF) 100 MCG/2ML IJ SOLN
25.0000 ug | INTRAMUSCULAR | Status: DC | PRN
  Administered 2016-05-09 (×2): 50 ug via INTRAVENOUS

## 2016-05-09 MED ORDER — CEFAZOLIN SODIUM-DEXTROSE 2-4 GM/100ML-% IV SOLN
INTRAVENOUS | Status: AC
  Filled 2016-05-09: qty 100

## 2016-05-09 MED ORDER — LIDOCAINE HCL (CARDIAC) 20 MG/ML IV SOLN
INTRAVENOUS | Status: DC | PRN
  Administered 2016-05-09: 60 mg via INTRAVENOUS

## 2016-05-09 MED ORDER — MIDAZOLAM HCL 2 MG/2ML IJ SOLN
INTRAMUSCULAR | Status: DC | PRN
  Administered 2016-05-09: 1 mg via INTRAVENOUS

## 2016-05-09 MED ORDER — SUGAMMADEX SODIUM 200 MG/2ML IV SOLN
INTRAVENOUS | Status: DC | PRN
  Administered 2016-05-09: 150 mg via INTRAVENOUS

## 2016-05-09 MED ORDER — ONDANSETRON HCL 4 MG/2ML IJ SOLN: 4.0000 mg | Freq: Once | INTRAMUSCULAR | Status: DC | PRN

## 2016-05-09 MED ORDER — IOTHALAMATE MEGLUMINE 43 % IV SOLN
INTRAVENOUS | Status: DC | PRN
  Administered 2016-05-09: 20 mL

## 2016-05-09 MED ORDER — ONDANSETRON HCL 4 MG/2ML IJ SOLN
INTRAMUSCULAR | Status: DC | PRN
  Administered 2016-05-09: 4 mg via INTRAVENOUS

## 2016-05-09 MED ORDER — FENTANYL CITRATE (PF) 100 MCG/2ML IJ SOLN
INTRAMUSCULAR | Status: DC | PRN
Start: 2016-05-09 — End: 2016-05-09
  Administered 2016-05-09: 100 ug via INTRAVENOUS

## 2016-05-09 MED ORDER — HYDROCODONE-ACETAMINOPHEN 5-325 MG PO TABS: 1.0000 | ORAL_TABLET | Freq: Four times a day (QID) | ORAL | 0 refills | Status: DC | PRN

## 2016-05-09 MED ORDER — ROCURONIUM BROMIDE 100 MG/10ML IV SOLN
INTRAVENOUS | Status: DC | PRN
  Administered 2016-05-09: 30 mg via INTRAVENOUS
  Administered 2016-05-09: 5 mg via INTRAVENOUS

## 2016-05-09 MED ORDER — FENTANYL CITRATE (PF) 100 MCG/2ML IJ SOLN
INTRAMUSCULAR | Status: AC
  Filled 2016-05-09: qty 2

## 2016-05-09 SURGICAL SUPPLY — 33 items
BACTOSHIELD CHG 4% 4OZ (MISCELLANEOUS) ×2
BASKET ZERO TIP 1.9FR (BASKET) IMPLANT
BSKT STON RTRVL ZERO TP 1.9FR (BASKET)
CATH URETL 5X70 OPEN END (CATHETERS) ×5 IMPLANT
CNTNR SPEC 2.5X3XGRAD LEK (MISCELLANEOUS) ×3
CONRAY 43 FOR UROLOGY 50M (MISCELLANEOUS) ×5 IMPLANT
CONT SPEC 4OZ STER OR WHT (MISCELLANEOUS) ×2
CONT SPEC 4OZ STRL OR WHT (MISCELLANEOUS) ×3
CONTAINER SPEC 2.5X3XGRAD LEK (MISCELLANEOUS) ×3 IMPLANT
GLOVE BIO SURGEON STRL SZ7 (GLOVE) ×10 IMPLANT
GLOVE BIO SURGEON STRL SZ7.5 (GLOVE) ×5 IMPLANT
GOWN STRL REUS W/ TWL LRG LVL3 (GOWN DISPOSABLE) ×3 IMPLANT
GOWN STRL REUS W/ TWL LRG LVL4 (GOWN DISPOSABLE) ×3 IMPLANT
GOWN STRL REUS W/TWL LRG LVL3 (GOWN DISPOSABLE) ×5
GOWN STRL REUS W/TWL LRG LVL4 (GOWN DISPOSABLE) ×5
GOWN STRL REUS W/TWL XL LVL3 (GOWN DISPOSABLE) ×5 IMPLANT
GUIDEWIRE SUPER STIFF (WIRE) IMPLANT
INTRODUCER DILATOR DOUBLE (INTRODUCER) ×5 IMPLANT
KIT RM TURNOVER CYSTO AR (KITS) ×5 IMPLANT
PACK CYSTO AR (MISCELLANEOUS) ×5 IMPLANT
SCRUB CHG 4% DYNA-HEX 4OZ (MISCELLANEOUS) ×3 IMPLANT
SENSORWIRE 0.038 NOT ANGLED (WIRE) ×5
SET CYSTO W/LG BORE CLAMP LF (SET/KITS/TRAYS/PACK) ×5 IMPLANT
SHEATH URETERAL 13/15X36 1L (SHEATH) IMPLANT
SOL .9 NS 3000ML IRR  AL (IV SOLUTION) ×2
SOL .9 NS 3000ML IRR AL (IV SOLUTION) ×3
SOL .9 NS 3000ML IRR UROMATIC (IV SOLUTION) ×3 IMPLANT
STENT URET 6FRX24 CONTOUR (STENTS) ×10 IMPLANT
STENT URET 6FRX26 CONTOUR (STENTS) IMPLANT
SURGILUBE 2OZ TUBE FLIPTOP (MISCELLANEOUS) ×5 IMPLANT
SYRINGE IRR TOOMEY STRL 70CC (SYRINGE) ×5 IMPLANT
WATER STERILE IRR 1000ML POUR (IV SOLUTION) ×5 IMPLANT
WIRE SENSOR 0.038 NOT ANGLED (WIRE) ×3 IMPLANT

## 2016-05-09 NOTE — H&P (Signed)
Lori Blanchard May 25, 1946 [id number]  Referring provider: [name], MD [facility] [place], [place] [address]      Chief Complaint  Patient presents with  . Follow-up    Ureteropelvic junction (UPJ) obstruction    HPI: The patient is a 70 year old female with a chronic right symptomatic UPJ obstruction that is treated with an indwelling ureteral stent given that she is of course located. She presents today to discuss planning for right ureteral stent change. She is due for stent exchange in late September 2017.  She also has a 6 mm nonobstructing left upper pole stone.  She has tried to have a right ureteral stent removed on multiple occasions. Shows develop symptomatic hydronephrosis or sepsis. She also developed as her stent is not changed every 3 months.   PMH:     Past Medical History:  Diagnosis Date  . Alzheimer disease   . Anxiety and depression   . Asthma   . Diabetes ([place])   . Family history of adverse reaction to anesthesia    daughter has breathing problems after anesthesia  . Gout   . Hydronephrosis   . Hyperlipemia   . Hypertension   . Hypothyroidism   . Osteoarthritis   . Osteoarthritis   . Pulmonary nodule   . Retroperitoneal fibrosis   . Sleep apnea     Surgical History:      Past Surgical History:  Procedure Laterality Date  . ABDOMINAL HYSTERECTOMY     partial  . BREAST SURGERY Bilateral 1990   Reduction  . CARPAL TUNNEL RELEASE    . CESAREAN SECTION     x 3  . CYSTOSCOPY W/ RETROGRADES Right 11/02/2015   Procedure: CYSTOSCOPY WITH RETROGRADE PYELOGRAM, URETERAL STENT EXCHANGE;  Surgeon: [name], MD;  Location: [facility];  Service: Urology;  Laterality: Right;  . CYSTOSCOPY W/ RETROGRADES Right 01/31/2016   Procedure: CYSTOSCOPY WITH RETROGRADE PYELOGRAM;  Surgeon: [name], MD;  Location: [facility];  Service: Urology;  Laterality: Right;  .  CYSTOSCOPY W/ URETERAL STENT PLACEMENT Right 05/11/2015   Procedure: CYSTOSCOPY WITH STENT REPLACEMENT;  Surgeon: [name], MD;  Location: [facility];  Service: Urology;  Laterality: Right;  . CYSTOSCOPY W/ URETERAL STENT PLACEMENT Right 01/31/2016   Procedure: CYSTOSCOPY, RETROGRADE PYELOGRAMS WITH STENT REPLACEMENT;  Surgeon: [name], MD;  Location: [facility];  Service: Urology;  Laterality: Right;  . HERNIA REPAIR     umbilical  . JOINT REPLACEMENT Left 2013   TKR  . JOINT REPLACEMENT Right 2011   TKR  . LAPAROSCOPIC HYSTERECTOMY    . REPLACEMENT TOTAL KNEE Bilateral     Home Medications:        Medication List           Accurate as of 02/29/16 10:15 AM. Always use your most recent med list.           albuterol (2.5 MG/3ML) 0.083% nebulizer solution Commonly known as:  PROVENTIL Inhale 2.5 mg into the lungs every 4 (four) hours as needed for wheezing or shortness of breath.  PROAIR HFA 108 (90 Base) MCG/ACT inhaler Generic drug:  albuterol USE 2 PUFFS EVERY FOUR HOURS AS NEEDED.  alendronate 70 MG tablet Commonly known as:  FOSAMAX TAKE ONE TABLET EVERY WEEK. TAKE WITH A FUUL GLASS OF WATER AND DO NOT LIE DOWN FOR THE NEXT 30 MINUTES.  usually on Friday or Saturday.  aspirin EC 81 MG tablet Take 81  mg by mouth every morning.  atorvastatin 40 MG tablet Commonly known as:  LIPITOR Take 40 mg by mouth every morning.  buPROPion 150 MG 24 hr tablet Commonly known as:  WELLBUTRIN XL Take 150 mg by mouth every morning.  cyclobenzaprine 5 MG tablet Commonly known as:  FLEXERIL Take 1 tablet (5 mg total) by mouth every 8 (eight) hours as needed for muscle spasms.  donepezil 5 MG tablet Commonly known as:  ARICEPT Take 5 mg by mouth daily.  fenofibrate 145 MG tablet Commonly known as:  TRICOR TAKE ONE (1) TABLET EACH DAY in am  furosemide 20 MG tablet Commonly known as:  LASIX Take 20 mg by mouth every morning.   HYDROcodone-acetaminophen 5-325 MG tablet Commonly known as:  NORCO Take 1 tablet by mouth every 6 (six) hours as needed for moderate pain.  insulin lispro 100 UNIT/ML KiwkPen Commonly known as:  HUMALOG Inject 10 Units into the skin 3 (three) times daily.  LANTUS SOLOSTAR 100 UNIT/ML Solostar Pen Generic drug:  Insulin Glargine Inject 20 Units into the skin daily at 10 pm.  levothyroxine 50 MCG tablet Commonly known as:  SYNTHROID, LEVOTHROID TAKE 1 TABLET ON AN EMPTY STOMACH AT LEAST 30-60 MINUTES BEFORE BREAKFAST  metFORMIN 1000 MG tablet Commonly known as:  GLUCOPHAGE twice a day  pantoprazole 40 MG tablet Commonly known as:  PROTONIX Take 40 mg by mouth every morning.  PEN NEEDLES 31GX5/16" 31G X 8 MM Misc Inject into the skin.  SURE COMFORT PEN NEEDLES 31G X 5 MM Misc Generic drug:  Insulin Pen Needle USE 4 TIMES A DAY OR AS DIRECTED BY DOCTOR  RA VITAMIN B-12 TR 1000 MCG Tbcr Generic drug:  Cyanocobalamin Take 1 tablet by mouth daily.  senna 8.6 MG tablet Commonly known as:  SENOKOT Take 1 tablet by mouth 2 (two) times daily. At 8 am and 5 pm  telmisartan 40 MG tablet Commonly known as:  MICARDIS TAKE ONE (1) TABLET EACH DAY in am  vitamin E 1000 UNIT capsule Take 1,000 Units by mouth daily.  vitamin E 400 UNIT capsule Take 400 Units by mouth every morning.      Allergies:      Allergies  Allergen Reactions  . Ibuprofen Itching, Nausea Only and Other (See Comments)    Family History:       Family History  Problem Relation Age of Onset  . Liver cancer Mother   . Colon cancer Mother   . Breast cancer Mother   . Diabetes Daughter   . Kidney disease Daughter     adrenal tumors  . Kidney cancer Neg Hx   . Prostate cancer Neg Hx     Social History:  reports that she has never smoked. She has never used smokeless tobacco. She reports that she does not drink alcohol or use drugs.  ROS: UROLOGY Frequent Urination?: No Hard to postpone  urination?: No Burning/pain with urination?: No Get up at night to urinate?: Yes Leakage of urine?: No Urine stream starts and stops?: No Trouble starting stream?: No Do you have to strain to urinate?: No Blood in urine?: No Urinary tract infection?: No Sexually transmitted disease?: No Injury to kidneys or bladder?: No Painful intercourse?: No Weak stream?: No Currently pregnant?: No Vaginal bleeding?: No Last menstrual period?: No  Gastrointestinal Nausea?: No Vomiting?: No Indigestion/heartburn?: No Diarrhea?: No Constipation?: No  Constitutional Fever: No Night sweats?: No Weight loss?: No Fatigue?: No  Skin Skin rash/lesions?: No Itching?: No  Eyes  Blurred vision?: No Double vision?: No  Ears/Nose/Throat Sore throat?: No Sinus problems?: No  Hematologic/Lymphatic Swollen glands?: No Easy bruising?: No  Cardiovascular Leg swelling?: Yes Chest pain?: No  Respiratory Cough?: No Shortness of breath?: Yes  Endocrine Excessive thirst?: No  Musculoskeletal Back pain?: No Joint pain?: Yes  Neurological Headaches?: Yes Dizziness?: No  Psychologic Depression?: Yes Anxiety?: Yes  Physical Exam: BP (!) 149/72 (BP Location: Left Arm, Patient Position: Sitting, Cuff Size: Normal)   Pulse 90   Ht 4\' 8"  (1.422 m)   Wt 160 lb 11.2 oz (72.9 kg)   BMI 36.03 kg/m   Constitutional:  Alert and oriented, No acute distress. HEENT: [place] AT, moist mucus membranes.  Trachea midline, no masses. Cardiovascular: No clubbing, cyanosis, or edema. Respiratory: Normal respiratory effort, no increased work of breathing. GI: Abdomen is soft, nontender, nondistended, no abdominal masses GU: No CVA tenderness.  Skin: No rashes, bruises or suspicious lesions. Lymph: No cervical or inguinal adenopathy. Neurologic: Grossly intact, no focal deficits, moving all 4 extremities. Psychiatric: Normal mood and affect.  Laboratory Data: RecentLabs       Lab  Results  Component Value Date   WBC 7.2 12/29/2015   HGB 10.5 (L) 12/29/2015   HCT 33.2 (L) 12/29/2015   MCV 74.1 (L) 12/29/2015   PLT 286 12/29/2015      RecentLabs       Lab Results  Component Value Date   CREATININE 0.90 12/29/2015      RecentLabs  No results found for: PSA    RecentLabs  No results found for: TESTOSTERONE    RecentLabs  No results found for: HGBA1C    Urinalysis Labs(Brief)          Component Value Date/Time   COLORURINE YELLOW (A) 01/18/2016 1106   APPEARANCEUR Clear 01/23/2016 0933   LABSPEC 1.016 01/18/2016 1106   LABSPEC 1.015 08/08/2011 1106   PHURINE 6.0 01/18/2016 1106   GLUCOSEU Negative 01/23/2016 0933   GLUCOSEU Negative 08/08/2011 1106   HGBUR 1+ (A) 01/18/2016 1106   BILIRUBINUR Negative 01/23/2016 0933   BILIRUBINUR Negative 08/08/2011 1106   KETONESUR NEGATIVE 01/18/2016 1106   PROTEINUR Negative 01/23/2016 0933   PROTEINUR NEGATIVE 01/18/2016 1106   NITRITE Negative 01/23/2016 0933   NITRITE NEGATIVE 01/18/2016 1106   LEUKOCYTESUR Trace (A) 01/23/2016 0933   LEUKOCYTESUR Negative 08/08/2011 1106      Pertinent Imaging: CLINICAL DATA: History of right renal stent.  EXAM: CT ABDOMEN AND PELVIS WITH CONTRAST  TECHNIQUE: Multidetector CT imaging of the abdomen and pelvis was performed using the standard protocol following bolus administration of intravenous contrast.  CONTRAST: 156mL ISOVUE-300 IOPAMIDOL (ISOVUE-300) INJECTION 61%  COMPARISON: 11/28/2012  FINDINGS: Lower chest: Subpleural fibrotic changes are seen in both lung bases. Mitral valve annulus calcifications are noted.  Hepatobiliary: No masses or other significant abnormality.  Pancreas: No mass, inflammatory changes, or other significant abnormality.  Spleen: Within normal limits in size and appearance.  Adrenals/Urinary Tract: There is a 6 mm obstructing left upper pole renal calculus. The  left kidney is otherwise normal. There is a moderate right hydronephrosis. Right ureteral stent is seen with proximal tip within the external pelvis, which is dilated. The distal end is located within the urinary bladder. The right ureter is decompressed. Moderate right perinephric fat stranding is noted.  Stomach/Bowel: No evidence of obstruction, inflammatory process, or abnormal fluid collections.  Vascular/Lymphatic: No pathologically enlarged lymph nodes. No evidence of abdominal aortic aneurysm.  Reproductive: No mass or other significant  abnormality, post hysterectomy.  Other: None.  Musculoskeletal: No suspicious bone lesions identified. Multilevel osteoarthritic changes of the spine are noted.  IMPRESSION: Right ureteral stent with proximal tip within dilated external pelvis and distal tip within the urinary bladder. Associated moderate right hydronephrosis and inflammatory perirenal fat stranding.  Nonobstructive left nephrolithiasis.    Assessment & Plan:    1. Right UPJ obstruction Continue every 3 month ureteral stent exchanges in the operating room. She was scheduled for this in late September.   2. Left renal stone Cystoscopy was 6 mm in size and may become symptomatically at some point. We discussed that since she is artery going to the operating room in a few months for ureteral stent exchange that we could address this at that time. The patient is agreeable to also undergoing left ureteroscopy, laser lithotripsy, left ureteral stent placement. She understands risks, benefits and indications of ureteroscopy. She understands and may take more than one procedure. She also understands she'll also list on the left side temporarily. All questions were answered. The patient has elected to proceed   Return for surgery.  [name], MD  [facility] [address], [place], [place] [address] [phone]

## 2016-05-09 NOTE — Anesthesia Procedure Notes (Signed)
Procedure Name: Intubation Performed by: Lance Muss Pre-anesthesia Checklist: Patient identified, Patient being monitored, Timeout performed, Emergency Drugs available and Suction available Patient Re-evaluated:Patient Re-evaluated prior to inductionOxygen Delivery Method: Circle system utilized Preoxygenation: Pre-oxygenation with 100% oxygen Intubation Type: IV induction Ventilation: Mask ventilation without difficulty and Oral airway inserted - appropriate to patient size Laryngoscope Size: Mac and 3 Grade View: Grade I Tube type: Oral Tube size: 7.0 mm Number of attempts: 1 Airway Equipment and Method: Stylet Placement Confirmation: ETT inserted through vocal cords under direct vision,  positive ETCO2 and breath sounds checked- equal and bilateral Secured at: 20 cm Tube secured with: Tape Dental Injury: Teeth and Oropharynx as per pre-operative assessment

## 2016-05-09 NOTE — Anesthesia Preprocedure Evaluation (Signed)
Anesthesia Evaluation  Patient identified by MRN, date of birth, ID band Patient awake    Reviewed: Allergy & Precautions, H&P , NPO status , Patient's Chart, lab work & pertinent test results, reviewed documented beta blocker date and time   History of Anesthesia Complications (+) Family history of anesthesia reaction and history of anesthetic complications  Airway Mallampati: III  TM Distance: >3 FB Neck ROM: full    Dental  (+) Missing, Poor Dentition, Dental Advidsory Given   Pulmonary shortness of breath and with exertion, asthma , sleep apnea and Continuous Positive Airway Pressure Ventilation , COPD,  COPD inhaler, neg recent URI,    Pulmonary exam normal breath sounds clear to auscultation       Cardiovascular Exercise Tolerance: Good hypertension, (-) angina(-) CAD, (-) Past MI, (-) Cardiac Stents and (-) CABG Normal cardiovascular exam(-) dysrhythmias (-) Valvular Problems/Murmurs Rhythm:regular Rate:Normal     Neuro/Psych PSYCHIATRIC DISORDERS (Depression and anxiety) negative neurological ROS     GI/Hepatic GERD  Medicated,NAFLD   Endo/Other  diabetes, Well Controlled, Insulin DependentHypothyroidism   Renal/GU CRFRenal disease (kidney stones)  negative genitourinary   Musculoskeletal   Abdominal   Peds  Hematology negative hematology ROS (+)   Anesthesia Other Findings Past Medical History: No date: Alzheimer disease No date: Anxiety and depression No date: Asthma No date: Depression No date: Diabetes ([place]) No date: Family history of adverse reaction to anesthes*     Comment: daughter has breathing problems after               anesthesia No date: Gout No date: Hydronephrosis No date: Hyperlipemia No date: Hypertension No date: Hypothyroidism No date: Osteoarthritis No date: Osteoarthritis No date: Pulmonary nodule 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 No date: Sleep apnea   Reproductive/Obstetrics negative OB ROS                             Anesthesia Physical Anesthesia Plan  ASA: III  Anesthesia Plan: General   Post-op Pain Management:    Induction:   Airway Management Planned:   Additional Equipment:   Intra-op Plan:   Post-operative Plan:   Informed Consent: I have reviewed the patients History and Physical, chart, labs and discussed the procedure including the risks, benefits and alternatives for the proposed anesthesia with the patient or authorized representative who has indicated his/her understanding and acceptance.   Dental Advisory Given  Plan Discussed with: Anesthesiologist, CRNA and Surgeon  Anesthesia Plan Comments:         Anesthesia Quick Evaluation

## 2016-05-09 NOTE — OR Nursing (Signed)
c-pap checked by Herbie Baltimore preop - placed in postop locker with other belongings

## 2016-05-09 NOTE — Transfer of Care (Signed)
Immediate Anesthesia Transfer of Care Note  Patient: Lori Blanchard  Procedure(s) Performed: Procedure(s): CYSTOSCOPY WITH STENT REPLACEMENT (Right) CYSTOSCOPY WITH RETROGRADE PYELOGRAM (Bilateral) CYSTOSCOPY WITH STENT PLACEMENT (Left) URETEROSCOPY (Bilateral)  Patient Location: PACU  Anesthesia Type:General  Level of Consciousness: sedated and responds to stimulation  Airway & Oxygen Therapy: Patient Spontanous Breathing and Patient connected to face mask oxygen  Post-op Assessment: Report given to RN and Post -op Vital signs reviewed and stable  Post vital signs: Reviewed and stable  Last Vitals:  Vitals:   05/09/16 0630 05/09/16 0846  BP: (!) 152/79 (!) 158/70  Pulse: 83 92  Resp: 18 20  Temp: (!) 35.9 C 36.5 C    Last Pain:  Vitals:   05/09/16 0630  TempSrc: Tympanic         Complications: No apparent anesthesia complications

## 2016-05-09 NOTE — Op Note (Signed)
Date of procedure: 05/09/16  Preoperative diagnosis:  1. Right UPJ obstruction 2. Left nonobstructing renal calculus   Postoperative diagnosis:  1. Same   Procedure: 1. Cystoscopy  2. Bilateral retrograde pyelogram with interpretation 3. Right ureteral stent exchange 6 French by 24 cm 4. Attempted left ureteroscopy 5. Left ureteral stent placement 6 French by 24 cm  Surgeon: Baruch Gouty, MD  Anesthesia: General  Complications: None  Intraoperative findings: Bilateral retrograde pyelograms were obtained to identify the respective collecting systems. She had hydronephrosis from a UPJ obstruction on the right and a filling defect from her know renal stone on the left without hydronephrosis. Right ureteral stent was successfully exchanged. I was unable to navigate the flexible ureteroscope to the left renal pelvis so attempts of this reported a stent was placed instead. Her previous right ureteral stent was also noted to be heavily calcified.  EBL: None  Specimens: None  Drains: Bilateral 6 French by 24 cm double-J ureteral stents  Disposition: Stable to the postanesthesia care unit  Indication for procedure: The patient is a 70 y.o. female with a right UPJ obstruction managed with chronic indwelling stent as well as a left renal calculus presents today for stent exchange in the right definitive stone management the left.  After reviewing the management options for treatment, the patient elected to proceed with the above surgical procedure(s). We have discussed the potential benefits and risks of the procedure, side effects of the proposed treatment, the likelihood of the patient achieving the goals of the procedure, and any potential problems that might occur during the procedure or recuperation. Informed consent has been obtained.  Description of procedure: The patient was met in the preoperative area. All risks, benefits, and indications of the procedure were described in great  detail. The patient consented to the procedure. Preoperative antibiotics were given. The patient was taken to the operative theater. General anesthesia was induced per the anesthesia service. The patient was then placed in the dorsal lithotomy position and prepped and draped in the usual sterile fashion. A preoperative timeout was called.   A 21 French 30 cystoscope was inserted into the patient's bladder per urethra atraumatically. The right ureteral stent was noted to be heavily calcified and grasped with flexible graspers to the level of the urethral meatus. The stent had to be cut in order to advance a wire and it due to its calcifications of the distal portion of it. A sensor wire was advanced to level of the right renal pelvis under fluoroscopy. With the aid of a dual-lumen catheter over the sensor wire retrograde pyelogram was obtained to identify the right collecting system which showed right hydronephrosis. GU the cystoscope was 6 [place] by 24 cm double-J ureteral stent was then placed over the sensor wire. The sensor wire was removed. Curl seen in the patient's renal pelvis under fluoroscopy and in the urinary bladder direct visualization. With the aid of a dual-lumen catheter 2 sensor wires were then advanced up the left ureter to the left renal pelvis under fluoroscopy. Attempt to place a ureteral access sheath supported as it did not easily advance. The flexors was placed over the sensor wires however was unable to be advanced past the proximal ureter due to the its narrow course. Prevent iatrogenic injury, further attempts to advance it were aborted. Retrograde powder was obtained showing no hydronephrosis but a filling defect with her known stone. Stone was also seen with fluoroscopy prior to the retrograde pyelogram. A left 6 [place] by 24  double-J ureteral stent was placed in a similar fashion as on the right side. The patient's bladder was drained. She was moved from anesthesia and transferred in  stable condition to the post anesthesia care unit.  Plan: Due to the patient's calcification and blockage of her distal right ureteral stent, she'll need systemic changes more frequently. She currently is getting them at the three-month interval. We will decrease her interval to 2 months. To minimize the number of operations she receives, we will plan to address her left renal stone burden at the time of her next right ureteral stent exchange.   Baruch Gouty, M.D.

## 2016-05-09 NOTE — Discharge Instructions (Signed)

## 2016-05-09 NOTE — Progress Notes (Signed)
Pt voided

## 2016-05-10 NOTE — Anesthesia Postprocedure Evaluation (Signed)
Anesthesia Post Note  Patient: Mariko H Myint  Procedure(s) Performed: Procedure(s) (LRB): CYSTOSCOPY WITH STENT REPLACEMENT (Right) CYSTOSCOPY WITH RETROGRADE PYELOGRAM (Bilateral) CYSTOSCOPY WITH STENT PLACEMENT (Left) URETEROSCOPY (Bilateral)  Patient location during evaluation: PACU Anesthesia Type: General Level of consciousness: awake and alert Pain management: pain level controlled Vital Signs Assessment: post-procedure vital signs reviewed and stable Respiratory status: spontaneous breathing, nonlabored ventilation, respiratory function stable and patient connected to nasal cannula oxygen Cardiovascular status: blood pressure returned to baseline and stable Postop Assessment: no signs of nausea or vomiting Anesthetic complications: no    Last Vitals:  Vitals:   05/09/16 1000 05/09/16 1028  BP: (!) 159/65 (!) 177/70  Pulse: 82 72  Resp: 16   Temp: 36.4 C     Last Pain:  Vitals:   05/10/16 0825  TempSrc:   PainSc: 9                  Martha Clan

## 2016-05-16 ENCOUNTER — Other Ambulatory Visit: Payer: Medicare Other | Admitting: Urology

## 2016-06-04 DIAGNOSIS — G4733 Obstructive sleep apnea (adult) (pediatric): Secondary | ICD-10-CM | POA: Insufficient documentation

## 2016-06-19 ENCOUNTER — Ambulatory Visit (INDEPENDENT_AMBULATORY_CARE_PROVIDER_SITE_OTHER): Payer: Medicare Other

## 2016-06-19 VITALS — BP 146/76 | HR 94 | Ht <= 58 in | Wt 158.1 lb

## 2016-06-19 DIAGNOSIS — N200 Calculus of kidney: Secondary | ICD-10-CM | POA: Diagnosis not present

## 2016-06-19 NOTE — Progress Notes (Signed)
In and Out Catheterization  Patient is present today for a I & O catheterization due to stent exchange. Patient was cleaned and prepped in a sterile fashion with betadine and Lidocaine 2% jelly was instilled into the urethra.  A 14FR cath was inserted no complications were noted , 137ml of urine return was noted, urine was yellow in color. A clean urine sample was collected for cx. Bladder was drained  And catheter was removed with out difficulty.    Preformed by: Toniann Fail, LPN   Blood pressure (!) 146/76, pulse 94, height 4\' 9"  (1.448 m), weight 158 lb 1.6 oz (71.7 kg).

## 2016-06-20 ENCOUNTER — Other Ambulatory Visit: Payer: Self-pay | Admitting: Radiology

## 2016-06-20 ENCOUNTER — Telehealth: Payer: Self-pay | Admitting: Radiology

## 2016-06-20 DIAGNOSIS — N200 Calculus of kidney: Secondary | ICD-10-CM

## 2016-06-20 DIAGNOSIS — N135 Crossing vessel and stricture of ureter without hydronephrosis: Secondary | ICD-10-CM

## 2016-06-20 NOTE — Telephone Encounter (Signed)
Notified pt of surgery scheduled with Dr Pilar Jarvis on 07/04/16, pre-admit testing appt on 06/21/16 @1 :00 & to call day prior to surgery for arrival time to SDS. Pt voices understanding.

## 2016-06-20 NOTE — Telephone Encounter (Signed)
-----   Message from [name], MD sent at 05/09/2016  8:53 AM EDT ----- Patient needs to be scheduled for: cystoscopy, bilateral ureteral stent exchange, left ureteroscopy, laser lithotripsy in 2 months

## 2016-06-21 ENCOUNTER — Encounter
Admission: RE | Admit: 2016-06-21 | Discharge: 2016-06-21 | Disposition: A | Discharge status: Home / Self Care | Payer: Medicare Other | Source: Ambulatory Visit | Attending: Urology | Admitting: Urology

## 2016-06-21 DIAGNOSIS — Z01812 Encounter for preprocedural laboratory examination: Secondary | ICD-10-CM | POA: Insufficient documentation

## 2016-06-21 HISTORY — DX: Personal history of urinary calculi: Z87.442

## 2016-06-21 LAB — CBC
HCT: 33.8 % — ABNORMAL LOW (ref 35.0–47.0)
Hemoglobin: 11.1 g/dL — ABNORMAL LOW (ref 12.0–16.0)
MCH: 24.7 pg — AB (ref 26.0–34.0)
MCHC: 32.8 g/dL (ref 32.0–36.0)
MCV: 75.3 fL — ABNORMAL LOW (ref 80.0–100.0)
PLATELETS: 330 10*3/uL (ref 150–440)
RBC: 4.49 MIL/uL (ref 3.80–5.20)
RDW: 15.7 % — ABNORMAL HIGH (ref 11.5–14.5)
WBC: 10.9 10*3/uL (ref 3.6–11.0)

## 2016-06-21 LAB — BASIC METABOLIC PANEL
Anion gap: 10 (ref 5–15)
BUN: 24 mg/dL — AB (ref 6–20)
CO2: 25 mmol/L (ref 22–32)
CREATININE: 1.05 mg/dL — AB (ref 0.44–1.00)
Calcium: 9.7 mg/dL (ref 8.9–10.3)
Chloride: 106 mmol/L (ref 101–111)
GFR calc Af Amer: >60 mL/min (ref >60)
GFR, EST NON AFRICAN AMERICAN: 53 mL/min — AB (ref >60)
GLUCOSE: 99 mg/dL (ref 65–99)
Potassium: 3.8 mmol/L (ref 3.5–5.1)
SODIUM: 141 mmol/L (ref 135–145)

## 2016-06-21 LAB — CULTURE, URINE COMPREHENSIVE

## 2016-06-21 NOTE — Pre-Procedure Instructions (Signed)
Pulmonary clearance from Dr. Raul Del on chart. Pt has Stent exchange with Dr. Pilar Jarvis every 2-3 months.

## 2016-06-21 NOTE — Patient Instructions (Signed)
  Your procedure is scheduled on:Wednesday Nov. 29 , 2017. Report to Same Day Surgery. To find out your arrival time please call [phone] between 1PM - 3PM on Tuesday Nov. 28, 2017.  Remember: Instructions that are not followed completely may result in serious medical risk, up to and including death, or upon the discretion of your surgeon and anesthesiologist your surgery may need to be rescheduled.    _x___ 1. Do not eat food or drink liquids after midnight. No gum chewing or hard candies.     ____ 2. No Alcohol for 24 hours before or after surgery.   ____ 3. Bring all medications with you on the day of surgery if instructed.    __x__ 4. Notify your doctor if there is any change in your medical condition     (cold, fever, infections).    _____ 5. No smoking 24 hours prior to surgery.     Do not wear jewelry, make-up, hairpins, clips or nail polish.  Do not wear lotions, powders, or perfumes.   Do not shave 48 hours prior to surgery. Men may shave face and neck.  Do not bring valuables to the hospital.    [facility] is not responsible for any belongings or valuables.               Contacts, dentures or bridgework may not be worn into surgery.  Leave your suitcase in the car. After surgery it may be brought to your room.  For patients admitted to the hospital, discharge time is determined by your treatment team.   Patients discharged the day of surgery will not be allowed to drive home.    Please read over the following fact sheets that you were given:   [facility] Preparing for Surgery  _x___ Take these medicines the morning of surgery with A SIP OF WATER:    1. atorvastatin (LIPITOR)  2. buPROPion (WELLBUTRIN XL)  3. donepezil (ARICEPT)  4. levothyroxine (SYNTHROID, LEVOTHROID)   5.  6.  ____ Fleet Enema (as directed)   ____ Use CHG Soap as directed on instruction sheet  _x_ Use inhalers on the day of surgery and bring to hospital day of surgery  _x_ Stop  metformin 2 days prior to surgery on Nov. 27, 2017.    _x___ Take 1/2 of usual insulin dose the night before surgery and none on the morning of surgery.   ____ Stop Coumadin/Plavix/aspirin on does not apply.  _x___ Stop Anti-inflammatories such as Advil, Aleve, Ibuprofen, Motrin, Naproxen, Naprosyn, Goodies powders or aspirin products. OK to take Tylenol.   ____ Stop supplements until after surgery.    _x___ Bring C-Pap to the hospital.

## 2016-07-03 MED ORDER — CEFAZOLIN SODIUM-DEXTROSE 2-4 GM/100ML-% IV SOLN
2.0000 g | INTRAVENOUS | Status: AC
  Administered 2016-07-04: 2 g via INTRAVENOUS

## 2016-07-04 ENCOUNTER — Ambulatory Visit
Admission: RE | Admit: 2016-07-04 | Discharge: 2016-07-04 | Disposition: A | Discharge status: Home / Self Care | Payer: Medicare Other | Source: Ambulatory Visit | Attending: Urology | Admitting: Urology

## 2016-07-04 ENCOUNTER — Ambulatory Visit: Payer: Medicare Other | Admitting: Certified Registered Nurse Anesthetist

## 2016-07-04 ENCOUNTER — Encounter
Admission: RE | Disposition: A | Discharge status: Home / Self Care | Payer: Self-pay | Source: Ambulatory Visit | Attending: Urology

## 2016-07-04 ENCOUNTER — Encounter: Payer: Self-pay | Admitting: *Deleted

## 2016-07-04 DIAGNOSIS — J449 Chronic obstructive pulmonary disease, unspecified: Secondary | ICD-10-CM | POA: Insufficient documentation

## 2016-07-04 DIAGNOSIS — N130 Hydronephrosis with ureteropelvic junction obstruction: Secondary | ICD-10-CM | POA: Diagnosis not present

## 2016-07-04 DIAGNOSIS — F329 Major depressive disorder, single episode, unspecified: Secondary | ICD-10-CM | POA: Diagnosis not present

## 2016-07-04 DIAGNOSIS — N135 Crossing vessel and stricture of ureter without hydronephrosis: Secondary | ICD-10-CM

## 2016-07-04 DIAGNOSIS — G309 Alzheimer's disease, unspecified: Secondary | ICD-10-CM | POA: Diagnosis not present

## 2016-07-04 DIAGNOSIS — E785 Hyperlipidemia, unspecified: Secondary | ICD-10-CM | POA: Diagnosis not present

## 2016-07-04 DIAGNOSIS — I10 Essential (primary) hypertension: Secondary | ICD-10-CM | POA: Diagnosis not present

## 2016-07-04 DIAGNOSIS — F419 Anxiety disorder, unspecified: Secondary | ICD-10-CM | POA: Insufficient documentation

## 2016-07-04 DIAGNOSIS — Z6834 Body mass index (BMI) 34.0-34.9, adult: Secondary | ICD-10-CM | POA: Diagnosis not present

## 2016-07-04 DIAGNOSIS — Z7982 Long term (current) use of aspirin: Secondary | ICD-10-CM | POA: Insufficient documentation

## 2016-07-04 DIAGNOSIS — F0280 Dementia in other diseases classified elsewhere without behavioral disturbance: Secondary | ICD-10-CM | POA: Insufficient documentation

## 2016-07-04 DIAGNOSIS — N132 Hydronephrosis with renal and ureteral calculous obstruction: Secondary | ICD-10-CM | POA: Insufficient documentation

## 2016-07-04 DIAGNOSIS — E119 Type 2 diabetes mellitus without complications: Secondary | ICD-10-CM | POA: Diagnosis not present

## 2016-07-04 DIAGNOSIS — Z794 Long term (current) use of insulin: Secondary | ICD-10-CM | POA: Diagnosis not present

## 2016-07-04 DIAGNOSIS — E039 Hypothyroidism, unspecified: Secondary | ICD-10-CM | POA: Insufficient documentation

## 2016-07-04 DIAGNOSIS — M1990 Unspecified osteoarthritis, unspecified site: Secondary | ICD-10-CM | POA: Insufficient documentation

## 2016-07-04 DIAGNOSIS — Z886 Allergy status to analgesic agent status: Secondary | ICD-10-CM | POA: Diagnosis not present

## 2016-07-04 DIAGNOSIS — M109 Gout, unspecified: Secondary | ICD-10-CM | POA: Insufficient documentation

## 2016-07-04 DIAGNOSIS — E669 Obesity, unspecified: Secondary | ICD-10-CM | POA: Insufficient documentation

## 2016-07-04 DIAGNOSIS — G4730 Sleep apnea, unspecified: Secondary | ICD-10-CM | POA: Insufficient documentation

## 2016-07-04 DIAGNOSIS — N200 Calculus of kidney: Secondary | ICD-10-CM

## 2016-07-04 DIAGNOSIS — N201 Calculus of ureter: Secondary | ICD-10-CM | POA: Diagnosis not present

## 2016-07-04 HISTORY — PX: URETEROSCOPY WITH HOLMIUM LASER LITHOTRIPSY: SHX6645

## 2016-07-04 HISTORY — PX: CYSTOSCOPY W/ URETERAL STENT PLACEMENT: SHX1429

## 2016-07-04 LAB — GLUCOSE, CAPILLARY
GLUCOSE-CAPILLARY: 203 mg/dL — AB (ref 65–99)
Glucose-Capillary: 192 mg/dL — ABNORMAL HIGH (ref 65–99)

## 2016-07-04 SURGERY — URETEROSCOPY, WITH LITHOTRIPSY USING HOLMIUM LASER
Anesthesia: General | Laterality: Left

## 2016-07-04 MED ORDER — LABETALOL HCL 5 MG/ML IV SOLN
INTRAVENOUS | Status: DC | PRN
  Administered 2016-07-04: 10 mg via INTRAVENOUS

## 2016-07-04 MED ORDER — SUGAMMADEX SODIUM 500 MG/5ML IV SOLN
INTRAVENOUS | Status: DC | PRN
  Administered 2016-07-04: 290 mg via INTRAVENOUS

## 2016-07-04 MED ORDER — DEXAMETHASONE SODIUM PHOSPHATE 10 MG/ML IJ SOLN
INTRAMUSCULAR | Status: DC | PRN
  Administered 2016-07-04: 4 mg via INTRAVENOUS

## 2016-07-04 MED ORDER — FENTANYL CITRATE (PF) 100 MCG/2ML IJ SOLN
INTRAMUSCULAR | Status: DC | PRN
  Administered 2016-07-04 (×2): 50 ug via INTRAVENOUS

## 2016-07-04 MED ORDER — PROPOFOL 10 MG/ML IV BOLUS
INTRAVENOUS | Status: DC | PRN
  Administered 2016-07-04: 80 mg via INTRAVENOUS

## 2016-07-04 MED ORDER — FAMOTIDINE 20 MG PO TABS
ORAL_TABLET | ORAL | Status: AC
  Administered 2016-07-04: 20 mg via ORAL
  Filled 2016-07-04: qty 1

## 2016-07-04 MED ORDER — HYDROCODONE-ACETAMINOPHEN 5-325 MG PO TABS: 1.0000 | ORAL_TABLET | Freq: Four times a day (QID) | ORAL | 0 refills | Status: DC | PRN

## 2016-07-04 MED ORDER — ONDANSETRON HCL 4 MG/2ML IJ SOLN
INTRAMUSCULAR | Status: DC | PRN
  Administered 2016-07-04: 4 mg via INTRAVENOUS

## 2016-07-04 MED ORDER — CEPHALEXIN 500 MG PO CAPS: 500.0000 mg | ORAL_CAPSULE | Freq: Three times a day (TID) | ORAL | 0 refills | Status: DC

## 2016-07-04 MED ORDER — FLUCONAZOLE 150 MG PO TABS: 150.0000 mg | ORAL_TABLET | Freq: Every day | ORAL | 0 refills | Status: DC

## 2016-07-04 MED ORDER — ROCURONIUM BROMIDE 100 MG/10ML IV SOLN
INTRAVENOUS | Status: DC | PRN
  Administered 2016-07-04: 10 mg via INTRAVENOUS
  Administered 2016-07-04: 20 mg via INTRAVENOUS
  Administered 2016-07-04: 30 mg via INTRAVENOUS

## 2016-07-04 MED ORDER — FENTANYL CITRATE (PF) 100 MCG/2ML IJ SOLN
25.0000 ug | INTRAMUSCULAR | Status: DC | PRN
  Administered 2016-07-04 (×4): 25 ug via INTRAVENOUS

## 2016-07-04 MED ORDER — FAMOTIDINE 20 MG PO TABS
20.0000 mg | ORAL_TABLET | Freq: Once | ORAL | Status: AC
  Administered 2016-07-04: 20 mg via ORAL

## 2016-07-04 MED ORDER — ONDANSETRON HCL 4 MG/2ML IJ SOLN: 4.0000 mg | Freq: Once | INTRAMUSCULAR | Status: DC | PRN

## 2016-07-04 MED ORDER — HYDROCODONE-ACETAMINOPHEN 5-325 MG PO TABS
ORAL_TABLET | ORAL | Status: AC
  Administered 2016-07-04: 1
  Filled 2016-07-04: qty 1

## 2016-07-04 MED ORDER — SODIUM CHLORIDE 0.9 % IV SOLN
INTRAVENOUS | Status: DC
  Administered 2016-07-04: 07:00:00 via INTRAVENOUS

## 2016-07-04 MED ORDER — EPHEDRINE SULFATE 50 MG/ML IJ SOLN
INTRAMUSCULAR | Status: DC | PRN
  Administered 2016-07-04 (×2): 15 mg via INTRAVENOUS

## 2016-07-04 MED ORDER — CEFAZOLIN SODIUM-DEXTROSE 2-4 GM/100ML-% IV SOLN
INTRAVENOUS | Status: AC
  Filled 2016-07-04: qty 100

## 2016-07-04 MED ORDER — LIDOCAINE HCL (CARDIAC) 10 MG/ML IV SOLN
INTRAVENOUS | Status: DC | PRN
  Administered 2016-07-04: 20 mg via INTRAVENOUS

## 2016-07-04 MED ORDER — FENTANYL CITRATE (PF) 100 MCG/2ML IJ SOLN
INTRAMUSCULAR | Status: AC
  Filled 2016-07-04: qty 2

## 2016-07-04 SURGICAL SUPPLY — 32 items
BACTOSHIELD CHG 4% 4OZ (MISCELLANEOUS) ×2
BASKET ZERO TIP 1.9FR (BASKET) ×4 IMPLANT
BSKT STON RTRVL ZERO TP 1.9FR (BASKET) ×2
CATH URETL 5X70 OPEN END (CATHETERS) ×4 IMPLANT
CNTNR SPEC 2.5X3XGRAD LEK (MISCELLANEOUS) ×2
CONT SPEC 4OZ STER OR WHT (MISCELLANEOUS) ×2
CONT SPEC 4OZ STRL OR WHT (MISCELLANEOUS) ×2
CONTAINER SPEC 2.5X3XGRAD LEK (MISCELLANEOUS) ×2 IMPLANT
FIBER LASER LITHO 273 (Laser) ×4 IMPLANT
GLOVE BIO SURGEON STRL SZ7 (GLOVE) ×12 IMPLANT
GLOVE BIO SURGEON STRL SZ7.5 (GLOVE) ×12 IMPLANT
GOWN STRL REUS W/ TWL LRG LVL4 (GOWN DISPOSABLE) ×2 IMPLANT
GOWN STRL REUS W/TWL LRG LVL4 (GOWN DISPOSABLE) ×4
GOWN STRL REUS W/TWL XL LVL3 (GOWN DISPOSABLE) ×4 IMPLANT
GUIDEWIRE SUPER STIFF (WIRE) IMPLANT
INTRODUCER DILATOR DOUBLE (INTRODUCER) ×4 IMPLANT
KIT RM TURNOVER CYSTO AR (KITS) ×4 IMPLANT
PACK CYSTO AR (MISCELLANEOUS) ×4 IMPLANT
SCRUB CHG 4% DYNA-HEX 4OZ (MISCELLANEOUS) ×2 IMPLANT
SENSORWIRE 0.038 NOT ANGLED (WIRE) ×8
SET CYSTO W/LG BORE CLAMP LF (SET/KITS/TRAYS/PACK) ×4 IMPLANT
SHEATH URETERAL 12FRX35CM (MISCELLANEOUS) ×4 IMPLANT
SHEATH URETERAL 13/15X36 1L (SHEATH) ×4 IMPLANT
SOL .9 NS 3000ML IRR  AL (IV SOLUTION) ×2
SOL .9 NS 3000ML IRR AL (IV SOLUTION) ×2
SOL .9 NS 3000ML IRR UROMATIC (IV SOLUTION) ×2 IMPLANT
STENT URET 6FRX24 CONTOUR (STENTS) ×8 IMPLANT
STENT URET 6FRX26 CONTOUR (STENTS) IMPLANT
SURGILUBE 2OZ TUBE FLIPTOP (MISCELLANEOUS) ×4 IMPLANT
SYRINGE IRR TOOMEY STRL 70CC (SYRINGE) ×4 IMPLANT
WATER STERILE IRR 1000ML POUR (IV SOLUTION) ×4 IMPLANT
WIRE SENSOR 0.038 NOT ANGLED (WIRE) ×4 IMPLANT

## 2016-07-04 NOTE — Anesthesia Procedure Notes (Signed)
Procedure Name: Intubation Date/Time: 07/04/2016 7:49 AM Performed by: Darlyne Russian Pre-anesthesia Checklist: Patient identified, Emergency Drugs available, Suction available and Patient being monitored Patient Re-evaluated:Patient Re-evaluated prior to inductionOxygen Delivery Method: Circle system utilized Preoxygenation: Pre-oxygenation with 100% oxygen Intubation Type: IV induction Ventilation: Mask ventilation without difficulty Laryngoscope Size: Mac and 3 Grade View: Grade I Tube type: Oral Tube size: 7.0 mm Number of attempts: 1 Airway Equipment and Method: Stylet Placement Confirmation: ETT inserted through vocal cords under direct vision,  positive ETCO2 and breath sounds checked- equal and bilateral Secured at: 20 cm Tube secured with: Tape Dental Injury: Teeth and Oropharynx as per pre-operative assessment

## 2016-07-04 NOTE — Op Note (Signed)
Date of procedure: 07/04/16  Preoperative diagnosis:  1. Right UPJ obstruction 2. Left nonobstructing renal stone   Postoperative diagnosis:  1. Right UPJ obstruction 2. Left non-obstructing renal stone   Procedure: 1. Cystoscopy 2. Bilateral ureteral stent exchange 6 [place] by 24 cm 3. Left ureteroscopy 4. Laser lithotripsy 5. Bilateral retrograde pyelogram with interpretation 6. Stone basketing  Surgeon: Hadley Pen, MD  Anesthesia: General  Complications: None  Intraoperative findings: The right ureteral stent was successfully exchanged. The stone the left side was broken into small pieces were removed. Right retrograde polygrams showed right UPJ obstruction. Left retrograde pyelogram showed no filling defects at the end the procedure.  EBL: None  Specimens: Left renal stone  Drains: Bilateral 6 French by 24 cm double-J ureteral stents  Disposition: Stable to the postanesthesia care unit  Indication for procedure: The patient is a 70 y.o. female with a chronic right UPJ obstruction is a poor surgical candidate and he was managed with a right indwelling stent. The stent exchange every 2 months because it became calcified obstructed at 3 months. She also has a left ureteral stent in place. After failed attempt to remove a left renal stone at her last stent exchange due to a narrowed ureter. She presents today for right ureteral stent exchange and management of her left renal stone..  After reviewing the management options for treatment, the patient elected to proceed with the above surgical procedure(s). We have discussed the potential benefits and risks of the procedure, side effects of the proposed treatment, the likelihood of the patient achieving the goals of the procedure, and any potential problems that might occur during the procedure or recuperation. Informed consent has been obtained.  Description of procedure: The patient was met in the preoperative area. All risks,  benefits, and indications of the procedure were described in great detail. The patient consented to the procedure. Preoperative antibiotics were given. The patient was taken to the operative theater. General anesthesia was induced per the anesthesia service. The patient was then placed in the dorsal lithotomy position and prepped and draped in the usual sterile fashion. A preoperative timeout was called.   A 21 French 30 cystoscope was inserted into the patient's bladder per urethra atraumatically. The right ureteral stent was grasped with flexible graspers and brought to level urethral meatus. A sensor wire was exchanged through the stent was removed. A retrograde polygrams obtained for renal pelvis identification with the aid of a dual-lumen catheter. A 6 French by 24 cm double-J ureteral stent was then placed. A curl was seen in the renal pelvis under fluoroscopy and the urinary bladder direct visualization. The left ureteral stent was then removed with flexible graspers. A sensor was exchanged for this stent removed. With the aid of a dual-lumen catheter, second sensor wire was placed. Over one of the sensor wires, a ureteral access sheath was then placed under fluoroscopy atraumatically. The inner sheath and sensor wire removed. Flexors was introduced in the patient's left renal pelvis through the access sheath. Pan nephroscopy revealed a solitary stone in the right upper pole. This was broken into smaller pieces with laser lithotripsy. He was then removed entirely with a stone basket. Pain nephroscopy revealed no further stone fragments. Retrograde polygrams confirm this with no filling defects. The sheath was then removed removed under direct position showing no residual stones or trauma. A 6 French by 24 cm double-J left ureteral stent was in place in identical fashion as on the contralateral side. The patient's bladder  was then drained. She look for anesthesia and transferred stable condition to the  postanesthesia care unit.  Plan: The patient will follow-up in 2 months for right ureteral stent exchange. We'll remove the left ureteral stent at that time. If the left renal stent becomes uncomfortable to the patient, we can remove it sooner in the office though this puts her at risk for dislodging her right ureteral stent.  Baruch Gouty, M.D.

## 2016-07-04 NOTE — Progress Notes (Signed)
Pain medication given for pain, bedside commode in  With pt.  Family in with patient and not very happy Because she is in pain.

## 2016-07-04 NOTE — Anesthesia Preprocedure Evaluation (Signed)
Anesthesia Evaluation  Patient identified by MRN, date of birth, ID band Patient awake    Reviewed: Allergy & Precautions, NPO status , Patient's Chart, lab work & pertinent test results  Airway Mallampati: III       Dental  (+) Teeth Intact   Pulmonary asthma , sleep apnea and Oxygen sleep apnea , COPD,    breath sounds clear to auscultation       Cardiovascular Exercise Tolerance: Poor hypertension, Pt. on medications  Rhythm:Regular Rate:Normal     Neuro/Psych Anxiety Depression    GI/Hepatic negative GI ROS, Patient received Oral Contrast Agents,  Endo/Other  diabetesHypothyroidism   Renal/GU      Musculoskeletal   Abdominal (+) + obese,   Peds  Hematology   Anesthesia Other Findings   Reproductive/Obstetrics                             Anesthesia Physical Anesthesia Plan  ASA: III  Anesthesia Plan: General   Post-op Pain Management:    Induction: Intravenous  Airway Management Planned: Oral ETT  Additional Equipment:   Intra-op Plan:   Post-operative Plan: Extubation in OR  Informed Consent: I have reviewed the patients History and Physical, chart, labs and discussed the procedure including the risks, benefits and alternatives for the proposed anesthesia with the patient or authorized representative who has indicated his/her understanding and acceptance.     Plan Discussed with: CRNA  Anesthesia Plan Comments:         Anesthesia Quick Evaluation

## 2016-07-04 NOTE — Discharge Instructions (Signed)

## 2016-07-04 NOTE — Anesthesia Postprocedure Evaluation (Signed)
Anesthesia Post Note  Patient: [name]  Procedure(s) Performed: Procedure(s) (LRB): URETEROSCOPY WITH HOLMIUM LASER LITHOTRIPSY (Left) CYSTOSCOPY WITH STENT REPLACEMENT (Bilateral)  Patient location during evaluation: PACU Anesthesia Type: General Level of consciousness: awake Pain management: pain level controlled Vital Signs Assessment: post-procedure vital signs reviewed and stable Respiratory status: spontaneous breathing Cardiovascular status: stable Anesthetic complications: no    Last Vitals:  Vitals:   07/04/16 0912 07/04/16 0927  BP: (!) 161/77 (!) 161/72  Pulse: 76 68  Resp: 19 15  Temp:      Last Pain:  Vitals:   07/04/16 0927  TempSrc:   PainSc: 8                  VAN STAVEREN,Emilynn Srinivasan

## 2016-07-04 NOTE — Transfer of Care (Signed)
Immediate Anesthesia Transfer of Care Note  Patient: Lori Blanchard  Procedure(s) Performed: Procedure(s): URETEROSCOPY WITH HOLMIUM LASER LITHOTRIPSY (Left) CYSTOSCOPY WITH STENT REPLACEMENT (Bilateral)  Patient Location: PACU  Anesthesia Type:General  Level of Consciousness: awake and oriented  Airway & Oxygen Therapy: Patient Spontanous Breathing and Patient connected to nasal cannula oxygen  Post-op Assessment: Report given to RN and Post -op Vital signs reviewed and stable  Post vital signs: Reviewed and stable  Last Vitals:  Vitals:   07/04/16 0632 07/04/16 0842  BP: (!) 155/75 (!) 183/71  Pulse: 90 93  Resp: 12 (!) 24  Temp: 36.3 C 36.4 C    Last Pain:  Vitals:   07/04/16 0842  TempSrc:   PainSc: 0-No pain         Complications: No apparent anesthesia complications

## 2016-07-04 NOTE — H&P (Signed)
Lori Blanchard 1945-11-18 [id number]  Referring provider: [name], MD [facility] [place], [place] [address]      Chief Complaint  Patient presents with  . Follow-up    Ureteropelvic junction (UPJ) obstruction    HPI: The patient is a 70 year old female with a chronic right symptomatic UPJ obstruction that is treated with an indwelling ureteral stent exchanged every 2 months 2/2 calcification. She presents today for right ureteral stent exchange. She also has a 6 mm nonobstructing left upper pole stone s/ left ureteral stent which will be addressed via left URS today.     PMH:     Past Medical History:  Diagnosis Date  . Alzheimer disease   . Anxiety and depression   . Asthma   . Diabetes ([place])   . Family history of adverse reaction to anesthesia    daughter has breathing problems after anesthesia  . Gout   . Hydronephrosis   . Hyperlipemia   . Hypertension   . Hypothyroidism   . Osteoarthritis   . Osteoarthritis   . Pulmonary nodule   . Retroperitoneal fibrosis   . Sleep apnea     Surgical History:      Past Surgical History:  Procedure Laterality Date  . ABDOMINAL HYSTERECTOMY     partial  . BREAST SURGERY Bilateral 1990   Reduction  . CARPAL TUNNEL RELEASE    . CESAREAN SECTION     x 3  . CYSTOSCOPY W/ RETROGRADES Right 11/02/2015   Procedure: CYSTOSCOPY WITH RETROGRADE PYELOGRAM, URETERAL STENT EXCHANGE; Surgeon: [name], MD; Location: [facility] ORS; Service: Urology; Laterality: Right;  . CYSTOSCOPY W/ RETROGRADES Right 01/31/2016   Procedure: CYSTOSCOPY WITH RETROGRADE PYELOGRAM; Surgeon: [name], MD; Location: [facility] ORS; Service: Urology; Laterality: Right;  . CYSTOSCOPY W/ URETERAL STENT PLACEMENT Right 05/11/2015   Procedure: CYSTOSCOPY WITH STENT REPLACEMENT; Surgeon: [name], MD; Location: [facility] ORS; Service: Urology; Laterality:  Right;  . CYSTOSCOPY W/ URETERAL STENT PLACEMENT Right 01/31/2016   Procedure: CYSTOSCOPY, RETROGRADE PYELOGRAMS WITH STENT REPLACEMENT; Surgeon: [name], MD; Location: [facility] ORS; Service: Urology; Laterality: Right;  . HERNIA REPAIR     umbilical  . JOINT REPLACEMENT Left 2013   TKR  . JOINT REPLACEMENT Right 2011   TKR  . LAPAROSCOPIC HYSTERECTOMY    . REPLACEMENT TOTAL KNEE Bilateral     Home Medications:        Medication List           Accurate as of 02/29/16 10:15 AM.Always use your most recent med list.           albuterol(2.5 MG/3ML) 0.083% nebulizer solution Commonly known as: PROVENTIL Inhale 2.5 mg into the lungs every 4 (four) hours as needed for wheezing or shortness of breath.  PROAIR HFA108 (90 Base) MCG/ACT inhaler Generic drug: albuterol USE 2 PUFFS EVERY FOUR HOURS AS NEEDED.  alendronate70 MG tablet Commonly known as: FOSAMAX TAKE ONE TABLET EVERY WEEK. TAKE WITH A FUUL GLASS OF WATER AND DO NOT LIE DOWN FOR THE NEXT 30 MINUTES. usually on Friday or Saturday.  aspirin EC81 MG tablet Take 81 mg by mouth every morning.  atorvastatin40 MG tablet Commonly known as: LIPITOR Take 40 mg by mouth every morning.  buPROPion150 MG 24 hr tablet Commonly known as: WELLBUTRIN XL Take 150 mg by mouth every morning.  cyclobenzaprine5 MG tablet Commonly known as: FLEXERIL Take 1 tablet (5 mg total) by mouth every 8 (eight) hours  as needed for muscle spasms.  donepezil5 MG tablet Commonly known as: ARICEPT Take 5 mg by mouth daily.  fenofibrate145 MG tablet Commonly known as: TRICOR TAKE ONE (1) TABLET EACH DAY in am  furosemide20 MG tablet Commonly known as: LASIX Take 20 mg by mouth every morning.  HYDROcodone-acetaminophen5-325 MG tablet Commonly known as: NORCO Take 1 tablet by mouth every 6 (six) hours as needed for moderate pain.  insulin lispro100 UNIT/ML KiwkPen Commonly known as:  HUMALOG Inject 10 Units into the skin 3 (three) times daily.  LANTUS SOLOSTAR100 UNIT/ML Solostar Pen Generic drug: Insulin Glargine Inject 20 Units into the skin daily at 10 pm.  levothyroxine50 MCG tablet Commonly known as: SYNTHROID, LEVOTHROID TAKE 1 TABLET ON AN EMPTY STOMACH AT LEAST 30-60 MINUTES BEFORE BREAKFAST  metFORMIN1000 MG tablet Commonly known as: GLUCOPHAGE twice a day  pantoprazole40 MG tablet Commonly known as: PROTONIX Take 40 mg by mouth every morning.  PEN NEEDLES 31GX5/16"31G X 8 MM Misc Inject into the skin.  SURE COMFORT PEN NEEDLES31G X 5 MM Misc Generic drug: Insulin Pen Needle USE 4 TIMES A DAY OR AS DIRECTED BY DOCTOR  RA VITAMIN B-12 TR1000 MCG Tbcr Generic drug: Cyanocobalamin Take 1 tablet by mouth daily.  senna8.6 MG tablet Commonly known as: SENOKOT Take 1 tablet by mouth 2 (two) times daily. At 8 am and 5 pm  telmisartan40 MG tablet Commonly known as: MICARDIS TAKE ONE (1) TABLET EACH DAY in am  vitamin E1000 UNIT capsule Take 1,000 Units by mouth daily.  vitamin E400 UNIT capsule Take 400 Units by mouth every morning.      Allergies:      Allergies  Allergen Reactions  . Ibuprofen Itching, Nausea Only and Other (See Comments)    Family History:       Family History  Problem Relation Age of Onset  . Liver cancer Mother   . Colon cancer Mother   . Breast cancer Mother   . Diabetes Daughter   . Kidney disease Daughter     adrenal tumors  . Kidney cancer Neg Hx   . Prostate cancer Neg Hx     Social History:reports that she has never smoked. She has never used smokeless tobacco. She reports that she does not drink alcohol or use drugs.  ROS: UROLOGY Frequent Urination?: No Hard to postpone urination?: No Burning/pain with urination?: No Get up at night to urinate?: Yes Leakage of urine?: No Urine stream starts and stops?: No Trouble starting stream?: No Do you have to  strain to urinate?: No Blood in urine?: No Urinary tract infection?: No Sexually transmitted disease?: No Injury to kidneys or bladder?: No Painful intercourse?: No Weak stream?: No Currently pregnant?: No Vaginal bleeding?: No Last menstrual period?: No  Gastrointestinal Nausea?: No Vomiting?: No Indigestion/heartburn?: No Diarrhea?: No Constipation?: No  Constitutional Fever: No Night sweats?: No Weight loss?: No Fatigue?: No  Skin Skin rash/lesions?: No Itching?: No  Eyes Blurred vision?: No Double vision?: No  Ears/Nose/Throat Sore throat?: No Sinus problems?: No  Hematologic/Lymphatic Swollen glands?: No Easy bruising?: No  Cardiovascular Leg swelling?: Yes Chest pain?: No  Respiratory Cough?: No Shortness of breath?: Yes  Endocrine Excessive thirst?: No  Musculoskeletal Back pain?: No Joint pain?: Yes  Neurological Headaches?: Yes Dizziness?: No  Psychologic Depression?: Yes Anxiety?: Yes  Physical Exam: BP (!) 149/72 (BP Location: Left Arm, Patient Position: Sitting, Cuff Size: Normal)  Pulse 90  Ht 4\' 8"  (1.422 m)  Wt 160 lb 11.2 oz (72.9  kg)  BMI 36.03 kg/m  Constitutional: Alert and oriented, No acute distress. HEENT: [place] AT, moist mucus membranes. Trachea midline, no masses. Cardiovascular: No clubbing, cyanosis, or edema. RRR. Respiratory:Normal respiratory effort,no increased work of breathing. Lungs clear. GI: Abdomen is soft, nontender, nondistended, no abdominal masses GU: No CVA tenderness.  Skin: No rashes, bruises or suspicious lesions. Lymph: No cervical or inguinal adenopathy. Neurologic: Grossly intact, no focal deficits, moving all 4 extremities. Psychiatric: Normal mood and affect.  Laboratory Data: RecentLabs       Lab Results  Component Value Date   WBC 7.2 12/29/2015   HGB 10.5 (L) 12/29/2015   HCT 33.2 (L) 12/29/2015   MCV 74.1 (L) 12/29/2015   PLT 286 12/29/2015       RecentLabs       Lab Results  Component Value Date   CREATININE 0.90 12/29/2015      RecentLabs  No results found for: PSA    RecentLabs  No results found for: TESTOSTERONE    RecentLabs  No results found for: HGBA1C    Urinalysis Labs(Brief)         Component Value Date/Time   COLORURINE YELLOW (A) 01/18/2016 1106   APPEARANCEUR Clear 01/23/2016 0933   LABSPEC 1.016 01/18/2016 1106   LABSPEC 1.015 08/08/2011 1106   PHURINE 6.0 01/18/2016 1106   GLUCOSEU Negative 01/23/2016 0933   GLUCOSEU Negative 08/08/2011 1106   HGBUR 1+ (A) 01/18/2016 1106   BILIRUBINUR Negative 01/23/2016 0933   BILIRUBINUR Negative 08/08/2011 1106   KETONESUR NEGATIVE 01/18/2016 1106   PROTEINUR Negative 01/23/2016 0933   PROTEINUR NEGATIVE 01/18/2016 1106   NITRITE Negative 01/23/2016 0933   NITRITE NEGATIVE 01/18/2016 1106   LEUKOCYTESUR Trace (A) 01/23/2016 0933   LEUKOCYTESUR Negative 08/08/2011 1106      Pertinent Imaging: CLINICAL DATA: History of right renal stent.  EXAM: CT ABDOMEN AND PELVIS WITH CONTRAST  TECHNIQUE: Multidetector CT imaging of the abdomen and pelvis was performed using the standard protocol following bolus administration of intravenous contrast.  CONTRAST: 176mL ISOVUE-300 IOPAMIDOL (ISOVUE-300) INJECTION 61%  COMPARISON: 11/28/2012  FINDINGS: Lower chest: Subpleural fibrotic changes are seen in both lung bases. Mitral valve annulus calcifications are noted.  Hepatobiliary: No masses or other significant abnormality.  Pancreas: No mass, inflammatory changes, or other significant abnormality.  Spleen: Within normal limits in size and appearance.  Adrenals/Urinary Tract: There is a 6 mm obstructing left upper pole renal calculus. The left kidney is otherwise normal. There is a moderate right hydronephrosis. Right ureteral stent is seen with proximal tip within the external pelvis,  which is dilated. The distal end is located within the urinary bladder. The right ureter is decompressed. Moderate right perinephric fat stranding is noted.  Stomach/Bowel: No evidence of obstruction, inflammatory process, or abnormal fluid collections.  Vascular/Lymphatic: No pathologically enlarged lymph nodes. No evidence of abdominal aortic aneurysm.  Reproductive: No mass or other significant abnormality, post hysterectomy.  Other: None.  Musculoskeletal: No suspicious bone lesions identified. Multilevel osteoarthritic changes of the spine are noted.  IMPRESSION: Right ureteral stent with proximal tip within dilated external pelvis and distal tip within the urinary bladder. Associated moderate right hydronephrosis and inflammatory perirenal fat stranding.  Nonobstructive left nephrolithiasis.    Assessment &Plan:   1. Right UPJ obstruction Stent exchange today  2. Left renal stone S/p left stent. Plan for left URS today

## 2016-07-05 ENCOUNTER — Encounter: Payer: Self-pay | Admitting: Urology

## 2016-07-12 LAB — STONE ANALYSIS
Ca Oxalate,Monohydr.: 77 %
Ca phos cry stone ql IR: 3 %
Stone Weight KSTONE: 60 mg
URIC ACID: 20 %

## 2016-08-06 HISTORY — PX: EYE SURGERY: SHX253

## 2016-08-06 HISTORY — PX: SKIN CANCER EXCISION: SHX779

## 2016-08-16 ENCOUNTER — Other Ambulatory Visit: Payer: Self-pay | Admitting: Radiology

## 2016-08-16 ENCOUNTER — Telehealth: Payer: Self-pay | Admitting: Radiology

## 2016-08-16 DIAGNOSIS — N135 Crossing vessel and stricture of ureter without hydronephrosis: Secondary | ICD-10-CM

## 2016-08-16 NOTE — Telephone Encounter (Signed)
Notified pt of surgery scheduled with Dr Pilar Jarvis on 09/14/16, pre-admit testing appt on 08/31/16 @9 :00 & ucx in office following pre-admit appt. Advised pt she may continue ASA 81mg  per Dr Pilar Jarvis. Pt voices understanding.

## 2016-08-16 NOTE — Telephone Encounter (Signed)
-----   Message from [name], MD sent at 07/04/2016  8:45 AM EST ----- Patient needs to be set up for cysto, right ureteral stent exchange, left ureteral stent removal in 2 months. Thanks.

## 2016-08-16 NOTE — Telephone Encounter (Signed)
LMOM. Need to discuss upcoming surgery. 

## 2016-08-31 ENCOUNTER — Ambulatory Visit (INDEPENDENT_AMBULATORY_CARE_PROVIDER_SITE_OTHER): Payer: Medicare Other

## 2016-08-31 ENCOUNTER — Encounter
Admission: RE | Admit: 2016-08-31 | Discharge: 2016-08-31 | Disposition: A | Discharge status: Home / Self Care | Payer: Medicare Other | Source: Ambulatory Visit | Attending: Urology | Admitting: Urology

## 2016-08-31 VITALS — BP 165/78 | HR 102 | Ht <= 58 in | Wt 158.6 lb

## 2016-08-31 DIAGNOSIS — N183 Chronic kidney disease, stage 3 unspecified: Secondary | ICD-10-CM

## 2016-08-31 DIAGNOSIS — Z01812 Encounter for preprocedural laboratory examination: Secondary | ICD-10-CM | POA: Diagnosis present

## 2016-08-31 HISTORY — DX: Chronic obstructive pulmonary disease, unspecified: J44.9

## 2016-08-31 LAB — BASIC METABOLIC PANEL
Anion gap: 11 (ref 5–15)
BUN: 22 mg/dL — AB (ref 6–20)
CHLORIDE: 106 mmol/L (ref 101–111)
CO2: 23 mmol/L (ref 22–32)
CREATININE: 0.83 mg/dL (ref 0.44–1.00)
Calcium: 8.6 mg/dL — ABNORMAL LOW (ref 8.9–10.3)
GFR calc Af Amer: >60 mL/min (ref >60)
GFR calc non Af Amer: >60 mL/min (ref >60)
Glucose, Bld: 212 mg/dL — ABNORMAL HIGH (ref 65–99)
Potassium: 3.7 mmol/L (ref 3.5–5.1)
Sodium: 140 mmol/L (ref 135–145)

## 2016-08-31 LAB — CBC
HEMATOCRIT: 33.1 % — AB (ref 35.0–47.0)
HEMOGLOBIN: 10.7 g/dL — AB (ref 12.0–16.0)
MCH: 24 pg — AB (ref 26.0–34.0)
MCHC: 32.3 g/dL (ref 32.0–36.0)
MCV: 74.3 fL — AB (ref 80.0–100.0)
Platelets: 302 10*3/uL (ref 150–440)
RBC: 4.46 MIL/uL (ref 3.80–5.20)
RDW: 14.9 % — ABNORMAL HIGH (ref 11.5–14.5)
WBC: 12 10*3/uL — ABNORMAL HIGH (ref 3.6–11.0)

## 2016-08-31 LAB — URINALYSIS, COMPLETE
Bilirubin, UA: NEGATIVE
Glucose, UA: NEGATIVE
Ketones, UA: NEGATIVE
NITRITE UA: NEGATIVE
PH UA: 7 (ref 5.0–7.5)
Specific Gravity, UA: 1.02 (ref 1.005–1.030)
Urobilinogen, Ur: 0.2 mg/dL (ref 0.2–1.0)

## 2016-08-31 LAB — MICROSCOPIC EXAMINATION: Bacteria, UA: NONE SEEN

## 2016-08-31 NOTE — Progress Notes (Signed)
In and Out Catheterization  Patient is present today for a I & O catheterization due to upcoming surgery. Patient was cleaned and prepped in a sterile fashion with betadine and Lidocaine 2% jelly was instilled into the urethra.  A 14FR cath was inserted no complications were noted , 175ml of urine return was noted, urine was yellow in color. A clean urine sample was collected for u/a and cx. Bladder was drained  And catheter was removed with out difficulty.    Preformed by: Toniann Fail, LPN   Blood pressure (!) 165/78, pulse (!) 102, height 4\' 9"  (1.448 m), weight 158 lb 9.6 oz (71.9 kg).

## 2016-08-31 NOTE — Patient Instructions (Addendum)
  Your procedure is scheduled on: 09/14/16 Fri Report to Same Day Surgery 2nd floor medical mall [facility] Entrance-take elevator on left to 2nd floor.  Check in with surgery information desk.) To find out your arrival time please call [phone] between 1PM - 3PM on 09/13/16 Thur  Remember: Instructions that are not followed completely may result in serious medical risk, up to and including death, or upon the discretion of your surgeon and anesthesiologist your surgery may need to be rescheduled.    _x___ 1. Do not eat food or drink liquids after midnight. No gum chewing or hard candies.     __x__ 2. No Alcohol for 24 hours before or after surgery.   __x__3. No Smoking for 24 prior to surgery.   ____  4. Bring all medications with you on the day of surgery if instructed.    __x__ 5. Notify your doctor if there is any change in your medical condition     (cold, fever, infections).     Do not wear jewelry, make-up, hairpins, clips or nail polish.  Do not wear lotions, powders, or perfumes. You may wear deodorant.  Do not shave 48 hours prior to surgery. Men may shave face and neck.  Do not bring valuables to the hospital.    [facility] is not responsible for any belongings or valuables.               Contacts, dentures or bridgework may not be worn into surgery.  Leave your suitcase in the car. After surgery it may be brought to your room.  For patients admitted to the hospital, discharge time is determined by your treatment team.   Patients discharged the day of surgery will not be allowed to drive home.  You will need someone to drive you home and stay with you the night of your procedure.    Please read over the following fact sheets that you were given:   [facility] Preparing for Surgery and or MRSA Information   _x___ Take these medicines the morning of surgery with A SIP OF WATER:    1. atorvastatin (LIPITOR  2.buPROPion (WELLBUTRIN XL  3.donepezil  (ARICEPT  4.levothyroxine (SYNTHROID  5.pantoprazole (PROTONIX)   6.telmisartan (MICARDIS  ____Fleets enema or Magnesium Citrate as directed.   _x___ Use CHG Soap or sage wipes as directed on instruction sheet   __x__ Use inhalers on the day of surgery and bring to hospital day of surgery as needed  __x__ Stop metformin 2 days prior to surgery    _x___ Take 1/2 of usual insulin dose the night before surgery and none on the morning of           surgery.   ___  Stop Aspirin, Coumadin, Pllavix ,Eliquis, Effient, or Pradaxa  x__ Stop Anti-inflammatories such as Advil, Aleve, Ibuprofen, Motrin, Naproxen,          Naprosyn, Goodies powders or aspirin products. Ok to take Tylenol.   ____ Stop supplements until after surgery.    __x__ Bring C-Pap to the hospital.

## 2016-08-31 NOTE — Pre-Procedure Instructions (Signed)
Pulmonary clearance on chart. 

## 2016-09-03 LAB — CULTURE, URINE COMPREHENSIVE

## 2016-09-14 ENCOUNTER — Encounter: Payer: Self-pay | Admitting: *Deleted

## 2016-09-14 ENCOUNTER — Ambulatory Visit
Admission: RE | Admit: 2016-09-14 | Discharge: 2016-09-14 | Disposition: A | Discharge status: Home / Self Care | Payer: Medicare Other | Source: Ambulatory Visit | Attending: Urology | Admitting: Urology

## 2016-09-14 ENCOUNTER — Encounter
Admission: RE | Disposition: A | Discharge status: Home / Self Care | Payer: Self-pay | Source: Ambulatory Visit | Attending: Urology

## 2016-09-14 ENCOUNTER — Ambulatory Visit: Payer: Medicare Other | Admitting: Anesthesiology

## 2016-09-14 DIAGNOSIS — Z9989 Dependence on other enabling machines and devices: Secondary | ICD-10-CM | POA: Diagnosis not present

## 2016-09-14 DIAGNOSIS — Z7982 Long term (current) use of aspirin: Secondary | ICD-10-CM | POA: Diagnosis not present

## 2016-09-14 DIAGNOSIS — I10 Essential (primary) hypertension: Secondary | ICD-10-CM | POA: Insufficient documentation

## 2016-09-14 DIAGNOSIS — E785 Hyperlipidemia, unspecified: Secondary | ICD-10-CM | POA: Insufficient documentation

## 2016-09-14 DIAGNOSIS — J449 Chronic obstructive pulmonary disease, unspecified: Secondary | ICD-10-CM | POA: Diagnosis not present

## 2016-09-14 DIAGNOSIS — N131 Hydronephrosis with ureteral stricture, not elsewhere classified: Secondary | ICD-10-CM | POA: Diagnosis not present

## 2016-09-14 DIAGNOSIS — Z87442 Personal history of urinary calculi: Secondary | ICD-10-CM | POA: Diagnosis not present

## 2016-09-14 DIAGNOSIS — Z79899 Other long term (current) drug therapy: Secondary | ICD-10-CM | POA: Insufficient documentation

## 2016-09-14 DIAGNOSIS — N135 Crossing vessel and stricture of ureter without hydronephrosis: Secondary | ICD-10-CM

## 2016-09-14 DIAGNOSIS — G4730 Sleep apnea, unspecified: Secondary | ICD-10-CM | POA: Insufficient documentation

## 2016-09-14 DIAGNOSIS — F329 Major depressive disorder, single episode, unspecified: Secondary | ICD-10-CM | POA: Insufficient documentation

## 2016-09-14 DIAGNOSIS — G309 Alzheimer's disease, unspecified: Secondary | ICD-10-CM | POA: Diagnosis not present

## 2016-09-14 DIAGNOSIS — E119 Type 2 diabetes mellitus without complications: Secondary | ICD-10-CM | POA: Insufficient documentation

## 2016-09-14 DIAGNOSIS — Z794 Long term (current) use of insulin: Secondary | ICD-10-CM | POA: Insufficient documentation

## 2016-09-14 DIAGNOSIS — E039 Hypothyroidism, unspecified: Secondary | ICD-10-CM | POA: Diagnosis not present

## 2016-09-14 DIAGNOSIS — J45909 Unspecified asthma, uncomplicated: Secondary | ICD-10-CM | POA: Insufficient documentation

## 2016-09-14 DIAGNOSIS — Z96653 Presence of artificial knee joint, bilateral: Secondary | ICD-10-CM | POA: Diagnosis not present

## 2016-09-14 DIAGNOSIS — F0280 Dementia in other diseases classified elsewhere without behavioral disturbance: Secondary | ICD-10-CM | POA: Insufficient documentation

## 2016-09-14 HISTORY — PX: CYSTOSCOPY W/ URETERAL STENT PLACEMENT: SHX1429

## 2016-09-14 HISTORY — PX: CYSTOSCOPY W/ URETERAL STENT REMOVAL: SHX1430

## 2016-09-14 LAB — GLUCOSE, CAPILLARY
GLUCOSE-CAPILLARY: 152 mg/dL — AB (ref 65–99)
Glucose-Capillary: 169 mg/dL — ABNORMAL HIGH (ref 65–99)

## 2016-09-14 SURGERY — CYSTOSCOPY, FLEXIBLE, WITH STENT REPLACEMENT
Anesthesia: General | Site: Ureter | Laterality: Right | Wound class: Clean Contaminated

## 2016-09-14 MED ORDER — EPHEDRINE SULFATE 50 MG/ML IJ SOLN
INTRAMUSCULAR | Status: DC | PRN
  Administered 2016-09-14: 5 mg via INTRAVENOUS

## 2016-09-14 MED ORDER — LIDOCAINE HCL (PF) 2 % IJ SOLN
INTRAMUSCULAR | Status: AC
  Filled 2016-09-14: qty 2

## 2016-09-14 MED ORDER — CEFAZOLIN SODIUM-DEXTROSE 2-4 GM/100ML-% IV SOLN
INTRAVENOUS | Status: AC
  Filled 2016-09-14: qty 100

## 2016-09-14 MED ORDER — OXYCODONE HCL 5 MG/5ML PO SOLN: 5.0000 mg | Freq: Once | ORAL | Status: DC | PRN

## 2016-09-14 MED ORDER — ONDANSETRON HCL 4 MG/2ML IJ SOLN
INTRAMUSCULAR | Status: AC
  Filled 2016-09-14: qty 2

## 2016-09-14 MED ORDER — CEFAZOLIN SODIUM-DEXTROSE 2-4 GM/100ML-% IV SOLN
2.0000 g | INTRAVENOUS | Status: AC
  Administered 2016-09-14: 2 g via INTRAVENOUS

## 2016-09-14 MED ORDER — SODIUM CHLORIDE 0.9 % IV SOLN
INTRAVENOUS | Status: DC
  Administered 2016-09-14: 09:00:00 via INTRAVENOUS

## 2016-09-14 MED ORDER — HYDROCODONE-ACETAMINOPHEN 5-325 MG PO TABS: 1.0000 | ORAL_TABLET | ORAL | 0 refills | Status: DC | PRN

## 2016-09-14 MED ORDER — MIDAZOLAM HCL 2 MG/2ML IJ SOLN
INTRAMUSCULAR | Status: AC
  Filled 2016-09-14: qty 2

## 2016-09-14 MED ORDER — LIDOCAINE HCL (CARDIAC) 20 MG/ML IV SOLN
INTRAVENOUS | Status: DC | PRN
  Administered 2016-09-14: 80 mg via INTRAVENOUS

## 2016-09-14 MED ORDER — CEPHALEXIN 500 MG PO CAPS: 500.0000 mg | ORAL_CAPSULE | Freq: Three times a day (TID) | ORAL | 0 refills | Status: DC

## 2016-09-14 MED ORDER — ONDANSETRON HCL 4 MG/2ML IJ SOLN
INTRAMUSCULAR | Status: DC | PRN
  Administered 2016-09-14: 4 mg via INTRAVENOUS

## 2016-09-14 MED ORDER — IPRATROPIUM-ALBUTEROL 0.5-2.5 (3) MG/3ML IN SOLN
3.0000 mL | Freq: Once | RESPIRATORY_TRACT | Status: AC
  Administered 2016-09-14: 3 mL via RESPIRATORY_TRACT

## 2016-09-14 MED ORDER — OXYCODONE HCL 5 MG PO TABS: 5.0000 mg | ORAL_TABLET | Freq: Once | ORAL | Status: DC | PRN

## 2016-09-14 MED ORDER — IPRATROPIUM-ALBUTEROL 0.5-2.5 (3) MG/3ML IN SOLN
RESPIRATORY_TRACT | Status: AC
  Administered 2016-09-14: 3 mL via RESPIRATORY_TRACT
  Filled 2016-09-14: qty 3

## 2016-09-14 MED ORDER — DEXAMETHASONE SODIUM PHOSPHATE 10 MG/ML IJ SOLN
INTRAMUSCULAR | Status: AC
  Filled 2016-09-14: qty 1

## 2016-09-14 MED ORDER — FENTANYL CITRATE (PF) 100 MCG/2ML IJ SOLN
INTRAMUSCULAR | Status: DC | PRN
  Administered 2016-09-14: 50 ug via INTRAVENOUS
  Administered 2016-09-14 (×2): 25 ug via INTRAVENOUS

## 2016-09-14 MED ORDER — PROPOFOL 10 MG/ML IV BOLUS
INTRAVENOUS | Status: DC | PRN
  Administered 2016-09-14: 140 mg via INTRAVENOUS

## 2016-09-14 MED ORDER — FENTANYL CITRATE (PF) 100 MCG/2ML IJ SOLN
INTRAMUSCULAR | Status: AC
  Filled 2016-09-14: qty 2

## 2016-09-14 MED ORDER — PROPOFOL 10 MG/ML IV BOLUS
INTRAVENOUS | Status: AC
  Filled 2016-09-14: qty 20

## 2016-09-14 MED ORDER — FENTANYL CITRATE (PF) 100 MCG/2ML IJ SOLN: 25.0000 ug | INTRAMUSCULAR | Status: DC | PRN

## 2016-09-14 SURGICAL SUPPLY — 18 items
BACTOSHIELD CHG 4% 4OZ (MISCELLANEOUS) ×2
GLOVE BIO SURGEON STRL SZ7.5 (GLOVE) ×4 IMPLANT
GOWN STRL REUS W/ TWL LRG LVL4 (GOWN DISPOSABLE) ×2 IMPLANT
GOWN STRL REUS W/TWL LRG LVL4 (GOWN DISPOSABLE) ×4
GOWN STRL REUS W/TWL XL LVL3 (GOWN DISPOSABLE) ×4 IMPLANT
KIT RM TURNOVER CYSTO AR (KITS) ×4 IMPLANT
PACK CYSTO AR (MISCELLANEOUS) ×4 IMPLANT
SCRUB CHG 4% DYNA-HEX 4OZ (MISCELLANEOUS) ×2 IMPLANT
SENSORWIRE 0.038 NOT ANGLED (WIRE)
SET CYSTO W/LG BORE CLAMP LF (SET/KITS/TRAYS/PACK) ×4 IMPLANT
SOL .9 NS 3000ML IRR  AL (IV SOLUTION) ×2
SOL .9 NS 3000ML IRR AL (IV SOLUTION) ×2
SOL .9 NS 3000ML IRR UROMATIC (IV SOLUTION) ×2 IMPLANT
STENT URET 6FRX24 CONTOUR (STENTS) IMPLANT
STENT URET 6FRX26 CONTOUR (STENTS) IMPLANT
SURGILUBE 2OZ TUBE FLIPTOP (MISCELLANEOUS) ×4 IMPLANT
WATER STERILE IRR 1000ML POUR (IV SOLUTION) ×4 IMPLANT
WIRE SENSOR 0.038 NOT ANGLED (WIRE) IMPLANT

## 2016-09-14 NOTE — Op Note (Signed)
Date of procedure: 09/14/16  Preoperative diagnosis:  1. Right ureteropelvic junction obstruction 2. History of left nonobstructive renal nephrolithiasis   Postoperative diagnosis:  1. Same   Procedure: 1. Cystoscopy 2. Left ureteral stent removal 3. Right retrograde pyelograms with interpretation 4. Right ureteral stent exchange 6 French by 24 cm  Surgeon:  , MD  Anesthesia: General  Complications: None  Intraoperative findings: The patient had successful removal of her left ureteral stent. Right ureteral stent was exchanged without difficulty. Right retrograde pyelogram did show continued right ureteral pelvic junction obstruction.  EBL: None  Specimens: None  Drains: 6 French by 24 cm right double-J ureteral stent  Disposition: Stable to the postanesthesia care unit  Indication for procedure: The patient is a 70 y.o. female with history of a right UPJ obstruction that due to her overall medical comorbidities is managed with a indwelling right ureteral stent. She had issues with the stent becoming calcified and obstructed with three-month stent exchanges, so she is currently on stent exchange every 2 months. She also underwent removal of a left nonobstructing stone at the time for her last stent exchange and also presents today for left ureteral stent removal.  After reviewing the management options for treatment, the patient elected to proceed with the above surgical procedure(s). We have discussed the potential benefits and risks of the procedure, side effects of the proposed treatment, the likelihood of the patient achieving the goals of the procedure, and any potential problems that might occur during the procedure or recuperation. Informed consent has been obtained.  Description of procedure: The patient was met in the preoperative area. All risks, benefits, and indications of the procedure were described in great detail. The patient consented to the procedure.  Preoperative antibiotics were given. The patient was taken to the operative theater. General anesthesia was induced per the anesthesia service. The patient was then placed in the dorsal lithotomy position and prepped and draped in the usual sterile fashion. A preoperative timeout was called.   A 21 French 30 cystoscope was inserted into the patient's bladder per urethra atraumatically. The left ureteral stent was removed intact with flexible graspers. The right ureter stent was then removed level urethral meatus and the sensor wire was exchanged through it up to the level of the renal pelvis on fluoroscopy. An open-ended catheter was exchanged over the sensor wire and sensor wire removed. A right retrograde pyelogram was then obtained showing her right UPJ obstruction. The sensor wire was then re-exchanged the open-ended catheter up to the level of the renal pelvis. With the aid of the cystoscope, 6 French by 21st of November double-J ureteral stent was then placed. The sensor wire was removed. A curl seen in the patient's right renal pelvis on fluoroscopy and in the urinary bladder under direct visualization. At this point, the patient's bladder was drained. She was woke from anesthesia and transferred in stable condition to the post anesthesia care unit.  Plan: The patient will follow-up in 2 months for repeat right ureteral stent exchange. She will also undergo a left retrograde pyelogram at that time throughout iatrogenic hydronephrosis.   , M.D.   

## 2016-09-14 NOTE — Anesthesia Procedure Notes (Signed)
Procedure Name: LMA Insertion Date/Time: 09/14/2016 9:39 AM Performed by: Aline Brochure Pre-anesthesia Checklist: Patient identified, Emergency Drugs available, Suction available and Patient being monitored Patient Re-evaluated:Patient Re-evaluated prior to inductionOxygen Delivery Method: Circle system utilized Preoxygenation: Pre-oxygenation with 100% oxygen Intubation Type: IV induction Ventilation: Mask ventilation without difficulty LMA: LMA inserted LMA Size: 3.5 Number of attempts: 1 Placement Confirmation: positive ETCO2 and breath sounds checked- equal and bilateral Tube secured with: Tape Dental Injury: Teeth and Oropharynx as per pre-operative assessment

## 2016-09-14 NOTE — Anesthesia Postprocedure Evaluation (Signed)
Anesthesia Post Note  Patient: Lori Blanchard  Procedure(s) Performed: Procedure(s) (LRB): CYSTOSCOPY WITH STENT REPLACEMENT (Right) CYSTOSCOPY WITH STENT REMOVAL (Left)  Patient location during evaluation: PACU Anesthesia Type: General Level of consciousness: awake and alert Pain management: pain level controlled Vital Signs Assessment: post-procedure vital signs reviewed and stable Respiratory status: spontaneous breathing, nonlabored ventilation, respiratory function stable and patient connected to nasal cannula oxygen Cardiovascular status: blood pressure returned to baseline and stable Postop Assessment: no signs of nausea or vomiting Anesthetic complications: no     Last Vitals:  Vitals:   09/14/16 1129 09/14/16 1157  BP: (!) 148/73 (!) 152/58  Pulse: 85 95  Resp: 18 18  Temp: 36.6 C     Last Pain:  Vitals:   09/14/16 1157  TempSrc:   PainSc: 1                  Precious Haws Piscitello

## 2016-09-14 NOTE — Anesthesia Post-op Follow-up Note (Cosign Needed)
Anesthesia QCDR form completed.        

## 2016-09-14 NOTE — Discharge Instructions (Signed)

## 2016-09-14 NOTE — H&P (Signed)
Lori Blanchard 12/21/1945 [id number]  Referring provider: [name], MD [facility] [place], [place] [id number]      Chief Complaint  Patient presents with  . Follow-up    Ureteropelvic junction (UPJ) obstruction    HPI: The patient is a 71 year old female with a chronic right symptomatic UPJ obstruction that is treated with an indwelling ureteral stent exchanged every 2 months 2/2 calcification. She presents today for right ureteral stent exchange and left ureteral stent removal.   She had a 6 mm nonobstructing left upper pole stone that was removed during last stent exchange. It was 77% calcium oxalate monohydrate, 3% calcium phosphate, and 20% uric acid. She needs left ureteral stent removed today.    PMH:     Past Medical History:  Diagnosis Date  . Alzheimer disease   . Anxiety and depression   . Asthma   . Diabetes ([place])   . Family history of adverse reaction to anesthesia    daughter has breathing problems after anesthesia  . Gout   . Hydronephrosis   . Hyperlipemia   . Hypertension   . Hypothyroidism   . Osteoarthritis   . Osteoarthritis   . Pulmonary nodule   . Retroperitoneal fibrosis   . Sleep apnea     Surgical History:      Past Surgical History:  Procedure Laterality Date  . ABDOMINAL HYSTERECTOMY     partial  . BREAST SURGERY Bilateral 1990   Reduction  . CARPAL TUNNEL RELEASE    . CESAREAN SECTION     x 3  . CYSTOSCOPY W/ RETROGRADES Right 11/02/2015   Procedure: CYSTOSCOPY WITH RETROGRADE PYELOGRAM, URETERAL STENT EXCHANGE; Surgeon: [name], MD; Location: [facility] ORS; Service: Urology; Laterality: Right;  . CYSTOSCOPY W/ RETROGRADES Right 01/31/2016   Procedure: CYSTOSCOPY WITH RETROGRADE PYELOGRAM; Surgeon: [name], MD; Location: [facility] ORS; Service: Urology; Laterality: Right;  . CYSTOSCOPY W/ URETERAL STENT PLACEMENT Right 05/11/2015    Procedure: CYSTOSCOPY WITH STENT REPLACEMENT; Surgeon: [name], MD; Location: [facility] ORS; Service: Urology; Laterality: Right;  . CYSTOSCOPY W/ URETERAL STENT PLACEMENT Right 01/31/2016   Procedure: CYSTOSCOPY, RETROGRADE PYELOGRAMS WITH STENT REPLACEMENT; Surgeon: [name], MD; Location: [facility] ORS; Service: Urology; Laterality: Right;  . HERNIA REPAIR     umbilical  . JOINT REPLACEMENT Left 2013   TKR  . JOINT REPLACEMENT Right 2011   TKR  . LAPAROSCOPIC HYSTERECTOMY    . REPLACEMENT TOTAL KNEE Bilateral     Home Medications:        Medication List           Accurate as of 02/29/16 10:15 AM.Always use your most recent med list.           albuterol(2.5 MG/3ML) 0.083% nebulizer solution Commonly known as: PROVENTIL Inhale 2.5 mg into the lungs every 4 (four) hours as needed for wheezing or shortness of breath.  PROAIR HFA108 (90 Base) MCG/ACT inhaler Generic drug: albuterol USE 2 PUFFS EVERY FOUR HOURS AS NEEDED.  alendronate70 MG tablet Commonly known as: FOSAMAX TAKE ONE TABLET EVERY WEEK. TAKE WITH A FUUL GLASS OF WATER AND DO NOT LIE DOWN FOR THE NEXT 30 MINUTES. usually on Friday or Saturday.  aspirin EC81 MG tablet Take 81 mg by mouth every morning.  atorvastatin40 MG tablet Commonly known as: LIPITOR Take 40 mg by mouth every morning.  buPROPion150 MG 24 hr tablet Commonly known as: WELLBUTRIN XL Take 150 mg by mouth every morning.  cyclobenzaprine5 MG tablet Commonly known as: FLEXERIL Take 1 tablet (5 mg total) by mouth every 8 (eight) hours as needed for muscle spasms.  donepezil5 MG tablet Commonly known as: ARICEPT Take 5 mg by mouth daily.  fenofibrate145 MG tablet Commonly known as: TRICOR TAKE ONE (1) TABLET EACH DAY in am  furosemide20 MG tablet Commonly known as: LASIX Take 20 mg by mouth every morning.  HYDROcodone-acetaminophen5-325 MG tablet Commonly known as:  NORCO Take 1 tablet by mouth every 6 (six) hours as needed for moderate pain.  insulin lispro100 UNIT/ML KiwkPen Commonly known as: HUMALOG Inject 10 Units into the skin 3 (three) times daily.  LANTUS SOLOSTAR100 UNIT/ML Solostar Pen Generic drug: Insulin Glargine Inject 20 Units into the skin daily at 10 pm.  levothyroxine50 MCG tablet Commonly known as: SYNTHROID, LEVOTHROID TAKE 1 TABLET ON AN EMPTY STOMACH AT LEAST 30-60 MINUTES BEFORE BREAKFAST  metFORMIN1000 MG tablet Commonly known as: GLUCOPHAGE twice a day  pantoprazole40 MG tablet Commonly known as: PROTONIX Take 40 mg by mouth every morning.  PEN NEEDLES 31GX5/16"31G X 8 MM Misc Inject into the skin.  SURE COMFORT PEN NEEDLES31G X 5 MM Misc Generic drug: Insulin Pen Needle USE 4 TIMES A DAY OR AS DIRECTED BY DOCTOR  RA VITAMIN B-12 TR1000 MCG Tbcr Generic drug: Cyanocobalamin Take 1 tablet by mouth daily.  senna8.6 MG tablet Commonly known as: SENOKOT Take 1 tablet by mouth 2 (two) times daily. At 8 am and 5 pm  telmisartan40 MG tablet Commonly known as: MICARDIS TAKE ONE (1) TABLET EACH DAY in am  vitamin E1000 UNIT capsule Take 1,000 Units by mouth daily.  vitamin E400 UNIT capsule Take 400 Units by mouth every morning.      Allergies:      Allergies  Allergen Reactions  . Ibuprofen Itching, Nausea Only and Other (See Comments)    Family History:       Family History  Problem Relation Age of Onset  . Liver cancer Mother   . Colon cancer Mother   . Breast cancer Mother   . Diabetes Daughter   . Kidney disease Daughter     adrenal tumors  . Kidney cancer Neg Hx   . Prostate cancer Neg Hx     Social History:reports that she has never smoked. She has never used smokeless tobacco. She reports that she does not drink alcohol or use drugs.  ROS: UROLOGY Frequent Urination?: No Hard to postpone urination?: No Burning/pain with urination?: No Get up  at night to urinate?: Yes Leakage of urine?: No Urine stream starts and stops?: No Trouble starting stream?: No Do you have to strain to urinate?: No Blood in urine?: No Urinary tract infection?: No Sexually transmitted disease?: No Injury to kidneys or bladder?: No Painful intercourse?: No Weak stream?: No Currently pregnant?: No Vaginal bleeding?: No Last menstrual period?: No  Gastrointestinal Nausea?: No Vomiting?: No Indigestion/heartburn?: No Diarrhea?: No Constipation?: No  Constitutional Fever: No Night sweats?: No Weight loss?: No Fatigue?: No  Skin Skin rash/lesions?: No Itching?: No  Eyes Blurred vision?: No Double vision?: No  Ears/Nose/Throat Sore throat?: No Sinus problems?: No  Hematologic/Lymphatic Swollen glands?: No Easy bruising?: No  Cardiovascular Leg swelling?: Yes Chest pain?: No  Respiratory Cough?: No Shortness of breath?: Yes  Endocrine Excessive thirst?: No  Musculoskeletal Back pain?: No Joint pain?: Yes  Neurological Headaches?: Yes Dizziness?: No  Psychologic Depression?: Yes Anxiety?: Yes  Physical Exam: BP (!) 149/72 (BP Location: Left Arm, Patient Position: Sitting,  Cuff Size: Normal)  Pulse 90  Ht 4\' 8"  (1.422 m)  Wt 160 lb 11.2 oz (72.9 kg)  BMI 36.03 kg/m  Constitutional: Alert and oriented, No acute distress. HEENT: [place] AT, moist mucus membranes. Trachea midline, no masses. Cardiovascular: No clubbing, cyanosis, or edema. RRR. Respiratory:Normal respiratory effort,no increased work of breathing. Lungs clear. GI: Abdomen is soft, nontender, nondistended, no abdominal masses GU: No CVA tenderness.  Skin: No rashes, bruises or suspicious lesions. Lymph: No cervical or inguinal adenopathy. Neurologic: Grossly intact, no focal deficits, moving all 4 extremities. Psychiatric: Normal mood and affect.  Laboratory Data: RecentLabs       Lab Results  Component Value Date    WBC 7.2 12/29/2015   HGB 10.5 (L) 12/29/2015   HCT 33.2 (L) 12/29/2015   MCV 74.1 (L) 12/29/2015   PLT 286 12/29/2015      RecentLabs       Lab Results  Component Value Date   CREATININE 0.90 12/29/2015      RecentLabs  No results found for: PSA    RecentLabs  No results found for: TESTOSTERONE    RecentLabs  No results found for: HGBA1C    Urinalysis Labs(Brief)         Component Value Date/Time   COLORURINE YELLOW (A) 01/18/2016 1106   APPEARANCEUR Clear 01/23/2016 0933   LABSPEC 1.016 01/18/2016 1106   LABSPEC 1.015 08/08/2011 1106   PHURINE 6.0 01/18/2016 1106   GLUCOSEU Negative 01/23/2016 0933   GLUCOSEU Negative 08/08/2011 1106   HGBUR 1+ (A) 01/18/2016 1106   BILIRUBINUR Negative 01/23/2016 0933   BILIRUBINUR Negative 08/08/2011 1106   KETONESUR NEGATIVE 01/18/2016 1106   PROTEINUR Negative 01/23/2016 0933   PROTEINUR NEGATIVE 01/18/2016 1106   NITRITE Negative 01/23/2016 0933   NITRITE NEGATIVE 01/18/2016 1106   LEUKOCYTESUR Trace (A) 01/23/2016 0933   LEUKOCYTESUR Negative 08/08/2011 1106      Pertinent Imaging: CLINICAL DATA: History of right renal stent.  EXAM: CT ABDOMEN AND PELVIS WITH CONTRAST  TECHNIQUE: Multidetector CT imaging of the abdomen and pelvis was performed using the standard protocol following bolus administration of intravenous contrast.  CONTRAST: 155mL ISOVUE-300 IOPAMIDOL (ISOVUE-300) INJECTION 61%  COMPARISON: 11/28/2012  FINDINGS: Lower chest: Subpleural fibrotic changes are seen in both lung bases. Mitral valve annulus calcifications are noted.  Hepatobiliary: No masses or other significant abnormality.  Pancreas: No mass, inflammatory changes, or other significant abnormality.  Spleen: Within normal limits in size and appearance.  Adrenals/Urinary Tract: There is a 6 mm obstructing left upper pole renal calculus. The left kidney is  otherwise normal. There is a moderate right hydronephrosis. Right ureteral stent is seen with proximal tip within the external pelvis, which is dilated. The distal end is located within the urinary bladder. The right ureter is decompressed. Moderate right perinephric fat stranding is noted.  Stomach/Bowel: No evidence of obstruction, inflammatory process, or abnormal fluid collections.  Vascular/Lymphatic: No pathologically enlarged lymph nodes. No evidence of abdominal aortic aneurysm.  Reproductive: No mass or other significant abnormality, post hysterectomy.  Other: None.  Musculoskeletal: No suspicious bone lesions identified. Multilevel osteoarthritic changes of the spine are noted.  IMPRESSION: Right ureteral stent with proximal tip within dilated external pelvis and distal tip within the urinary bladder. Associated moderate right hydronephrosis and inflammatory perirenal fat stranding.  Nonobstructive left nephrolithiasis.    Assessment &Plan:   1. Right UPJ obstruction Stent exchange today  2. Left renal stone S/p left URS. Remove left ureteral stent today.

## 2016-09-14 NOTE — Transfer of Care (Signed)
Immediate Anesthesia Transfer of Care Note  Patient: Lori Blanchard  Procedure(s) Performed: Procedure(s): CYSTOSCOPY WITH STENT REPLACEMENT (Right) CYSTOSCOPY WITH STENT REMOVAL (Left)  Patient Location: PACU  Anesthesia Type:General  Level of Consciousness: awake, alert  and oriented  Airway & Oxygen Therapy: Patient connected to face mask oxygen  Post-op Assessment: Post -op Vital signs reviewed and stable  Post vital signs: stable  Last Vitals:  Vitals:   09/14/16 0759 09/14/16 1004  BP: (!) 178/73 (!) 155/74  Pulse: 88 (!) 107  Resp: 18 16  Temp: 36.5 C 36.6 C    Last Pain:  Vitals:   09/14/16 0759  TempSrc: Oral  PainSc: 6          Complications: No apparent anesthesia complications

## 2016-09-14 NOTE — Anesthesia Preprocedure Evaluation (Signed)
Anesthesia Evaluation  Patient identified by MRN, date of birth, ID band Patient awake    Reviewed: Allergy & Precautions, H&P , NPO status , Patient's Chart, lab work & pertinent test results  History of Anesthesia Complications Negative for: history of anesthetic complications  Airway Mallampati: III  TM Distance: <3 FB Neck ROM: limited    Dental  (+) Poor Dentition, Chipped, Missing   Pulmonary shortness of breath and with exertion, asthma , sleep apnea and Continuous Positive Airway Pressure Ventilation , COPD,    Pulmonary exam normal breath sounds clear to auscultation       Cardiovascular Exercise Tolerance: Good hypertension, (-) angina(-) Past MI Normal cardiovascular exam Rhythm:regular Rate:Normal     Neuro/Psych PSYCHIATRIC DISORDERS Anxiety Depression negative neurological ROS     GI/Hepatic negative GI ROS, Neg liver ROS,   Endo/Other  negative endocrine ROSdiabetes, Well Controlled, Type 2Hypothyroidism   Renal/GU Renal disease     Musculoskeletal  (+) Arthritis ,   Abdominal   Peds  Hematology negative hematology ROS (+)   Anesthesia Other Findings Past Medical History: No date: Alzheimer disease No date: Anxiety and depression No date: Asthma No date: COPD (chronic obstructive pulmonary disease) (* No date: Depression No date: Diabetes (HCC) No date: Family history of adverse reaction to anesthes*     Comment: daughter has breathing problems after               anesthesia No date: Gout No date: History of kidney stones No date: Hydronephrosis No date: Hyperlipemia No date: Hypertension No date: Hypothyroidism No date: Osteoarthritis No date: Osteoarthritis No date: Pulmonary nodule 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 No date: Sleep apnea  Past Surgical History: No date: ABDOMINAL HYSTERECTOMY     Comment: partial 1965: APPENDECTOMY 1990: BREAST SURGERY Bilateral     Comment:  Reduction No date: CARPAL TUNNEL RELEASE No date: CESAREAN SECTION     Comment: x 3 11/02/2015: CYSTOSCOPY W/ RETROGRADES Right     Comment: Procedure: CYSTOSCOPY WITH RETROGRADE               PYELOGRAM, URETERAL STENT EXCHANGE;  Surgeon:               [name], MD;  Location: [facility];                Service: Urology;  Laterality: Right; 01/31/2016: CYSTOSCOPY W/ RETROGRADES Right     Comment: Procedure: CYSTOSCOPY WITH RETROGRADE               PYELOGRAM;  Surgeon: [name], MD;                Location: [facility];  Service: Urology;                Laterality: Right; 05/09/2016: CYSTOSCOPY W/ RETROGRADES Bilateral     Comment: Procedure: CYSTOSCOPY WITH RETROGRADE               PYELOGRAM;  Surgeon: [name], MD;                Location: [facility];  Service: Urology;                Laterality: Bilateral; 05/11/2015: CYSTOSCOPY W/ URETERAL STENT PLACEMENT Right     Comment: Procedure: CYSTOSCOPY WITH STENT REPLACEMENT;               Surgeon: [name], MD;  Location:  [facility];  Service: Urology;  Laterality:               Right; 01/31/2016: CYSTOSCOPY W/ URETERAL STENT PLACEMENT Right     Comment: Procedure: CYSTOSCOPY, RETROGRADE PYELOGRAMS               WITH STENT REPLACEMENT;  Surgeon: [name], MD;  Location: [facility];  Service:               Urology;  Laterality: Right; 05/09/2016: CYSTOSCOPY W/ URETERAL STENT PLACEMENT Right     Comment: Procedure: CYSTOSCOPY WITH STENT REPLACEMENT;               Surgeon: [name], MD;  Location:               [facility];  Service: Urology;  Laterality:               Right; 07/04/2016: CYSTOSCOPY W/ URETERAL STENT PLACEMENT Bilateral     Comment: Procedure: CYSTOSCOPY WITH STENT REPLACEMENT;               Surgeon: [name], MD;  Location:               [facility];  Service: Urology;  Laterality:               Bilateral; 05/09/2016: CYSTOSCOPY WITH STENT  PLACEMENT Left     Comment: Procedure: CYSTOSCOPY WITH STENT PLACEMENT;                Surgeon: [name], MD;  Location:               [facility];  Service: Urology;  Laterality: Left; No date: HERNIA REPAIR     Comment: umbilical 0000000: JOINT REPLACEMENT Left     Comment: TKR 2011: JOINT REPLACEMENT Right     Comment: TKR No date: LAPAROSCOPIC HYSTERECTOMY No date: REDUCTION MAMMAPLASTY No date: REPLACEMENT TOTAL KNEE Bilateral 05/09/2016: URETEROSCOPY Bilateral     Comment: Procedure: URETEROSCOPY;  Surgeon: [name], MD;  Location: [facility];  Service:               Urology;  Laterality: Bilateral; 07/04/2016: URETEROSCOPY WITH HOLMIUM LASER LITHOTRIPSY Left     Comment: Procedure: URETEROSCOPY WITH HOLMIUM LASER               LITHOTRIPSY;  Surgeon: [name], MD;               Location: [facility];  Service: Urology;                Laterality: Left;  BMI    Body Mass Index:  34.19 kg/m      Reproductive/Obstetrics negative OB ROS                             Anesthesia Physical Anesthesia Plan  ASA: III  Anesthesia Plan: General LMA   Post-op Pain Management:    Induction:   Airway Management Planned:   Additional Equipment:   Intra-op Plan:   Post-operative Plan:   Informed Consent: I have reviewed the patients History and Physical, chart, labs and discussed the procedure including the risks, benefits and alternatives for the proposed anesthesia with the patient or authorized representative  who has indicated his/her understanding and acceptance.   Dental Advisory Given  Plan Discussed with: Anesthesiologist, CRNA and Surgeon  Anesthesia Plan Comments:         Anesthesia Quick Evaluation

## 2016-10-08 ENCOUNTER — Other Ambulatory Visit: Payer: Self-pay | Admitting: Specialist

## 2016-10-08 DIAGNOSIS — R0902 Hypoxemia: Secondary | ICD-10-CM

## 2016-10-08 DIAGNOSIS — R0609 Other forms of dyspnea: Secondary | ICD-10-CM

## 2016-10-08 DIAGNOSIS — J8410 Pulmonary fibrosis, unspecified: Secondary | ICD-10-CM

## 2016-10-09 ENCOUNTER — Other Ambulatory Visit: Payer: Self-pay | Admitting: Radiology

## 2016-10-09 ENCOUNTER — Telehealth: Payer: Self-pay | Admitting: Radiology

## 2016-10-09 NOTE — Telephone Encounter (Signed)
Notified pt of surgery scheduled with Dr Pilar Jarvis on 11/21/16 per pt request, pre-admit testing appt on 11/09/16 @9 :56, to RTC following pre-admit appt for pre-op ucx & to call day prior to surgery for arrival time to [facility]. Questions were answered. Pt voices understanding.

## 2016-10-10 ENCOUNTER — Other Ambulatory Visit: Payer: Self-pay | Admitting: Radiology

## 2016-10-10 DIAGNOSIS — N135 Crossing vessel and stricture of ureter without hydronephrosis: Secondary | ICD-10-CM

## 2016-10-22 ENCOUNTER — Ambulatory Visit
Admission: RE | Admit: 2016-10-22 | Discharge: 2016-10-22 | Disposition: A | Discharge status: Home / Self Care | Payer: Medicare Other | Source: Ambulatory Visit | Attending: Specialist | Admitting: Specialist

## 2016-10-22 DIAGNOSIS — I2584 Coronary atherosclerosis due to calcified coronary lesion: Secondary | ICD-10-CM | POA: Insufficient documentation

## 2016-10-22 DIAGNOSIS — I700 Atherosclerosis of aorta: Secondary | ICD-10-CM | POA: Diagnosis not present

## 2016-10-22 DIAGNOSIS — R0609 Other forms of dyspnea: Secondary | ICD-10-CM | POA: Diagnosis present

## 2016-10-22 DIAGNOSIS — J8410 Pulmonary fibrosis, unspecified: Secondary | ICD-10-CM | POA: Diagnosis present

## 2016-10-22 DIAGNOSIS — R0902 Hypoxemia: Secondary | ICD-10-CM | POA: Insufficient documentation

## 2016-11-07 ENCOUNTER — Ambulatory Visit (INDEPENDENT_AMBULATORY_CARE_PROVIDER_SITE_OTHER): Payer: Medicare Other

## 2016-11-07 VITALS — BP 145/74 | HR 83 | Ht 59.0 in | Wt 161.1 lb

## 2016-11-07 DIAGNOSIS — N189 Chronic kidney disease, unspecified: Secondary | ICD-10-CM

## 2016-11-07 NOTE — Progress Notes (Signed)
In and Out Catheterization  Patient is present today for a I & O catheterization due to stent exchange. Patient was cleaned and prepped in a sterile fashion with betadine and Lidocaine 2% jelly was instilled into the urethra.  A 14FR cath was inserted no complications were noted , 155ml of urine return was noted, urine was yellow in color. A clean urine sample was collected for cx. Bladder was drained  And catheter was removed with out difficulty.    Preformed by: Toniann Fail, LPN   Blood pressure (!) 145/74, pulse 83, height 4\' 11"  (1.499 m), weight 161 lb 1.6 oz (73.1 kg).

## 2016-11-09 ENCOUNTER — Encounter
Admission: RE | Admit: 2016-11-09 | Discharge: 2016-11-09 | Disposition: A | Discharge status: Home / Self Care | Payer: Medicare Other | Source: Ambulatory Visit | Attending: Urology | Admitting: Urology

## 2016-11-09 ENCOUNTER — Other Ambulatory Visit: Payer: Self-pay | Admitting: Radiology

## 2016-11-09 ENCOUNTER — Ambulatory Visit: Payer: Medicare Other

## 2016-11-09 DIAGNOSIS — Z01810 Encounter for preprocedural cardiovascular examination: Secondary | ICD-10-CM | POA: Insufficient documentation

## 2016-11-09 HISTORY — DX: Gastro-esophageal reflux disease without esophagitis: K21.9

## 2016-11-09 NOTE — Patient Instructions (Addendum)
Your procedure is scheduled on: Wednesday 11/21/16 Report to [place]. 2ND FLOOR MEDICAL MALL ENTRANCE. To find out your arrival time please call [phone] between 1PM - 3PM on Tuesday 11/20/16.  Remember: Instructions that are not followed completely may result in serious medical risk, up to and including death, or upon the discretion of your surgeon and anesthesiologist your surgery may need to be rescheduled.    __X__ 1. Do not eat food or drink liquids after midnight. No gum chewing or hard candies.     __X__ 2. No Alcohol for 24 hours before or after surgery.   ____ 3. Bring all medications with you on the day of surgery if instructed.    __X__ 4. Notify your doctor if there is any change in your medical condition     (cold, fever, infections).             __X___5. No smoking within 24 hours of your surgery.     Do not wear jewelry, make-up, hairpins, clips or nail polish.  Do not wear lotions, powders, or perfumes.   Do not shave 48 hours prior to surgery. Men may shave face and neck.  Do not bring valuables to the hospital.    [facility] is not responsible for any belongings or valuables.               Contacts, dentures or bridgework may not be worn into surgery.  Leave your suitcase in the car. After surgery it may be brought to your room.  For patients admitted to the hospital, discharge time is determined by your                treatment team.   Patients discharged the day of surgery will not be allowed to drive home.   Please read over the following fact sheets that you were given:   Pain Booklet and MRSA Information   __X__ Take these medicines the morning of surgery with A SIP OF WATER:    1. ATORVASTATIN  2. BUPROPION  3. DONEPEZIL  4. LEVOTHYROXINE  5. PANTOPRAZOLE  6. TELMISARTAN  ____ Fleet Enema (as directed)   ____ Use CHG Soap as directed  __X__ Use inhalers on the day of surgery AND NEBULIZER  ____ Stop metformin 2 days prior to  surgery    __X__ Take 1/2 of usual insulin dose the night before surgery and none on the morning of surgery.   __X__ Stop Coumadin/Plavix/aspirin on 11/14/16  __X__ Stop Anti-inflammatories such as Advil, Aleve, Ibuprofen, Motrin, Naproxen, Naprosyn, Goodies,powder, or aspirin products.  OK to take Tylenol.   ____ Stop supplements until after surgery.    __X__ Bring C-Pap to the hospital.

## 2016-11-10 LAB — CULTURE, URINE COMPREHENSIVE

## 2016-11-12 ENCOUNTER — Ambulatory Visit: Payer: Medicare Other

## 2016-11-14 NOTE — Pre-Procedure Instructions (Signed)
ekg compared with previous.

## 2016-11-21 ENCOUNTER — Encounter: Payer: Self-pay | Admitting: *Deleted

## 2016-11-21 ENCOUNTER — Encounter
Admission: RE | Disposition: A | Discharge status: Home / Self Care | Payer: Self-pay | Source: Ambulatory Visit | Attending: Urology

## 2016-11-21 ENCOUNTER — Ambulatory Visit
Admission: RE | Admit: 2016-11-21 | Discharge: 2016-11-21 | Disposition: A | Discharge status: Home / Self Care | Payer: Medicare Other | Source: Ambulatory Visit | Attending: Urology | Admitting: Urology

## 2016-11-21 ENCOUNTER — Ambulatory Visit: Payer: Medicare Other | Admitting: Anesthesiology

## 2016-11-21 DIAGNOSIS — E785 Hyperlipidemia, unspecified: Secondary | ICD-10-CM | POA: Diagnosis not present

## 2016-11-21 DIAGNOSIS — E039 Hypothyroidism, unspecified: Secondary | ICD-10-CM | POA: Diagnosis not present

## 2016-11-21 DIAGNOSIS — Z79899 Other long term (current) drug therapy: Secondary | ICD-10-CM | POA: Insufficient documentation

## 2016-11-21 DIAGNOSIS — N135 Crossing vessel and stricture of ureter without hydronephrosis: Secondary | ICD-10-CM | POA: Diagnosis not present

## 2016-11-21 DIAGNOSIS — Z96653 Presence of artificial knee joint, bilateral: Secondary | ICD-10-CM | POA: Diagnosis not present

## 2016-11-21 DIAGNOSIS — F0280 Dementia in other diseases classified elsewhere without behavioral disturbance: Secondary | ICD-10-CM | POA: Diagnosis not present

## 2016-11-21 DIAGNOSIS — E119 Type 2 diabetes mellitus without complications: Secondary | ICD-10-CM | POA: Diagnosis not present

## 2016-11-21 DIAGNOSIS — Z466 Encounter for fitting and adjustment of urinary device: Secondary | ICD-10-CM | POA: Insufficient documentation

## 2016-11-21 DIAGNOSIS — F329 Major depressive disorder, single episode, unspecified: Secondary | ICD-10-CM | POA: Diagnosis not present

## 2016-11-21 DIAGNOSIS — I10 Essential (primary) hypertension: Secondary | ICD-10-CM | POA: Insufficient documentation

## 2016-11-21 DIAGNOSIS — J449 Chronic obstructive pulmonary disease, unspecified: Secondary | ICD-10-CM | POA: Diagnosis not present

## 2016-11-21 DIAGNOSIS — F419 Anxiety disorder, unspecified: Secondary | ICD-10-CM | POA: Insufficient documentation

## 2016-11-21 DIAGNOSIS — Z7982 Long term (current) use of aspirin: Secondary | ICD-10-CM | POA: Insufficient documentation

## 2016-11-21 DIAGNOSIS — G309 Alzheimer's disease, unspecified: Secondary | ICD-10-CM | POA: Diagnosis not present

## 2016-11-21 DIAGNOSIS — K219 Gastro-esophageal reflux disease without esophagitis: Secondary | ICD-10-CM | POA: Diagnosis not present

## 2016-11-21 DIAGNOSIS — Z7983 Long term (current) use of bisphosphonates: Secondary | ICD-10-CM | POA: Diagnosis not present

## 2016-11-21 DIAGNOSIS — G4730 Sleep apnea, unspecified: Secondary | ICD-10-CM | POA: Diagnosis not present

## 2016-11-21 DIAGNOSIS — Z87442 Personal history of urinary calculi: Secondary | ICD-10-CM | POA: Diagnosis not present

## 2016-11-21 DIAGNOSIS — N130 Hydronephrosis with ureteropelvic junction obstruction: Secondary | ICD-10-CM

## 2016-11-21 DIAGNOSIS — Z794 Long term (current) use of insulin: Secondary | ICD-10-CM | POA: Insufficient documentation

## 2016-11-21 HISTORY — PX: CYSTOSCOPY W/ RETROGRADES: SHX1426

## 2016-11-21 HISTORY — PX: CYSTOSCOPY W/ URETERAL STENT PLACEMENT: SHX1429

## 2016-11-21 LAB — GLUCOSE, CAPILLARY
GLUCOSE-CAPILLARY: 202 mg/dL — AB (ref 65–99)
Glucose-Capillary: 205 mg/dL — ABNORMAL HIGH (ref 65–99)

## 2016-11-21 SURGERY — CYSTOSCOPY, FLEXIBLE, WITH STENT REPLACEMENT
Anesthesia: General | Site: Ureter | Laterality: Right | Wound class: Clean Contaminated

## 2016-11-21 MED ORDER — FENTANYL CITRATE (PF) 100 MCG/2ML IJ SOLN: 25.0000 ug | INTRAMUSCULAR | Status: DC | PRN

## 2016-11-21 MED ORDER — ONDANSETRON HCL 4 MG/2ML IJ SOLN
INTRAMUSCULAR | Status: DC | PRN
  Administered 2016-11-21: 4 mg via INTRAVENOUS

## 2016-11-21 MED ORDER — CEFAZOLIN SODIUM-DEXTROSE 2-4 GM/100ML-% IV SOLN
2.0000 g | INTRAVENOUS | Status: AC
  Administered 2016-11-21: 2 g via INTRAVENOUS

## 2016-11-21 MED ORDER — PROPOFOL 10 MG/ML IV BOLUS
INTRAVENOUS | Status: AC
  Filled 2016-11-21: qty 40

## 2016-11-21 MED ORDER — HYDROCODONE-ACETAMINOPHEN 5-325 MG PO TABS: 1.0000 | ORAL_TABLET | ORAL | 0 refills | Status: DC | PRN

## 2016-11-21 MED ORDER — FENTANYL CITRATE (PF) 100 MCG/2ML IJ SOLN
INTRAMUSCULAR | Status: AC
  Filled 2016-11-21: qty 2

## 2016-11-21 MED ORDER — MIDAZOLAM HCL 2 MG/2ML IJ SOLN
INTRAMUSCULAR | Status: DC | PRN
  Administered 2016-11-21: 2 mg via INTRAVENOUS

## 2016-11-21 MED ORDER — LIDOCAINE HCL (CARDIAC) 20 MG/ML IV SOLN
INTRAVENOUS | Status: DC | PRN
  Administered 2016-11-21: 60 mg via INTRAVENOUS

## 2016-11-21 MED ORDER — CEPHALEXIN 500 MG PO CAPS: 500.0000 mg | ORAL_CAPSULE | Freq: Three times a day (TID) | ORAL | 0 refills | Status: DC

## 2016-11-21 MED ORDER — DEXAMETHASONE SODIUM PHOSPHATE 10 MG/ML IJ SOLN
INTRAMUSCULAR | Status: DC | PRN
  Administered 2016-11-21: 10 mg via INTRAVENOUS

## 2016-11-21 MED ORDER — GLYCOPYRROLATE 0.2 MG/ML IJ SOLN
INTRAMUSCULAR | Status: AC
  Filled 2016-11-21: qty 1

## 2016-11-21 MED ORDER — SODIUM CHLORIDE 0.9 % IV SOLN
INTRAVENOUS | Status: DC
  Administered 2016-11-21 (×2): via INTRAVENOUS

## 2016-11-21 MED ORDER — DEXAMETHASONE SODIUM PHOSPHATE 10 MG/ML IJ SOLN
INTRAMUSCULAR | Status: AC
  Filled 2016-11-21: qty 1

## 2016-11-21 MED ORDER — SUCCINYLCHOLINE CHLORIDE 20 MG/ML IJ SOLN
INTRAMUSCULAR | Status: AC
  Filled 2016-11-21: qty 1

## 2016-11-21 MED ORDER — PROPOFOL 10 MG/ML IV BOLUS
INTRAVENOUS | Status: DC | PRN
  Administered 2016-11-21: 150 mg via INTRAVENOUS

## 2016-11-21 MED ORDER — FENTANYL CITRATE (PF) 100 MCG/2ML IJ SOLN
INTRAMUSCULAR | Status: DC | PRN
  Administered 2016-11-21: 50 ug via INTRAVENOUS

## 2016-11-21 MED ORDER — IOTHALAMATE MEGLUMINE 43 % IV SOLN
INTRAVENOUS | Status: DC | PRN
  Administered 2016-11-21: 15 mL

## 2016-11-21 MED ORDER — GLYCOPYRROLATE 0.2 MG/ML IJ SOLN
INTRAMUSCULAR | Status: DC | PRN
  Administered 2016-11-21: 0.2 mg via INTRAVENOUS

## 2016-11-21 MED ORDER — PHENYLEPHRINE HCL 10 MG/ML IJ SOLN
INTRAMUSCULAR | Status: AC
  Filled 2016-11-21: qty 1

## 2016-11-21 MED ORDER — CEFAZOLIN SODIUM-DEXTROSE 2-4 GM/100ML-% IV SOLN
INTRAVENOUS | Status: AC
  Filled 2016-11-21: qty 100

## 2016-11-21 MED ORDER — ONDANSETRON HCL 4 MG/2ML IJ SOLN
INTRAMUSCULAR | Status: AC
  Filled 2016-11-21: qty 2

## 2016-11-21 MED ORDER — EPHEDRINE SULFATE 50 MG/ML IJ SOLN
INTRAMUSCULAR | Status: AC
  Filled 2016-11-21: qty 1

## 2016-11-21 MED ORDER — MIDAZOLAM HCL 2 MG/2ML IJ SOLN
INTRAMUSCULAR | Status: AC
  Filled 2016-11-21: qty 2

## 2016-11-21 MED ORDER — LIDOCAINE HCL (PF) 2 % IJ SOLN
INTRAMUSCULAR | Status: AC
  Filled 2016-11-21: qty 2

## 2016-11-21 MED ORDER — ONDANSETRON HCL 4 MG/2ML IJ SOLN: 4.0000 mg | Freq: Once | INTRAMUSCULAR | Status: DC | PRN

## 2016-11-21 SURGICAL SUPPLY — 25 items
BACTOSHIELD CHG 4% 4OZ (MISCELLANEOUS) ×2
CATH URETL 5X70 OPEN END (CATHETERS) ×4 IMPLANT
CONRAY 43 FOR UROLOGY 50M (MISCELLANEOUS) ×4 IMPLANT
GLOVE BIO SURGEON STRL SZ7 (GLOVE) ×8 IMPLANT
GLOVE BIO SURGEON STRL SZ7.5 (GLOVE) ×4 IMPLANT
GOWN STRL REUS W/ TWL LRG LVL3 (GOWN DISPOSABLE) ×2 IMPLANT
GOWN STRL REUS W/ TWL LRG LVL4 (GOWN DISPOSABLE) ×2 IMPLANT
GOWN STRL REUS W/TWL LRG LVL3 (GOWN DISPOSABLE) ×4
GOWN STRL REUS W/TWL LRG LVL4 (GOWN DISPOSABLE) ×4
GOWN STRL REUS W/TWL XL LVL3 (GOWN DISPOSABLE) ×4 IMPLANT
INTRODUCER DILATOR DOUBLE (INTRODUCER) ×4 IMPLANT
KIT RM TURNOVER CYSTO AR (KITS) ×4 IMPLANT
PACK CYSTO AR (MISCELLANEOUS) ×4 IMPLANT
SCRUB CHG 4% DYNA-HEX 4OZ (MISCELLANEOUS) ×2 IMPLANT
SENSORWIRE 0.038 NOT ANGLED (WIRE) ×4
SET CYSTO W/LG BORE CLAMP LF (SET/KITS/TRAYS/PACK) ×4 IMPLANT
SOL .9 NS 3000ML IRR  AL (IV SOLUTION) ×2
SOL .9 NS 3000ML IRR AL (IV SOLUTION) ×2
SOL .9 NS 3000ML IRR UROMATIC (IV SOLUTION) ×2 IMPLANT
STENT URET 6FRX24 CONTOUR (STENTS) ×4 IMPLANT
STENT URET 6FRX26 CONTOUR (STENTS) IMPLANT
SURGILUBE 2OZ TUBE FLIPTOP (MISCELLANEOUS) ×4 IMPLANT
SYR 30ML LL (SYRINGE) ×4 IMPLANT
WATER STERILE IRR 1000ML POUR (IV SOLUTION) ×4 IMPLANT
WIRE SENSOR 0.038 NOT ANGLED (WIRE) ×2 IMPLANT

## 2016-11-21 NOTE — Anesthesia Postprocedure Evaluation (Signed)
Anesthesia Post Note  Patient: Bryttani H Mariner  Procedure(s) Performed: Procedure(s) (LRB): CYSTOSCOPY WITH STENT REPLACEMENT (Right) CYSTOSCOPY WITH RETROGRADE PYELOGRAM (Left)  Patient location during evaluation: PACU Anesthesia Type: General Level of consciousness: awake and alert Pain management: pain level controlled Vital Signs Assessment: post-procedure vital signs reviewed and stable Respiratory status: spontaneous breathing, nonlabored ventilation, respiratory function stable and patient connected to nasal cannula oxygen Cardiovascular status: blood pressure returned to baseline and stable Postop Assessment: no signs of nausea or vomiting Anesthetic complications: no     Last Vitals:  Vitals:   11/21/16 0901 11/21/16 0925  BP: (!) 157/67 (!) 145/66  Pulse: (!) 105 (!) 105  Resp: 16   Temp: 37 C     Last Pain:  Vitals:   11/21/16 0901  TempSrc: Temporal  PainSc:                  Annasofia Pohl S

## 2016-11-21 NOTE — Anesthesia Post-op Follow-up Note (Cosign Needed)
Anesthesia QCDR form completed.        

## 2016-11-21 NOTE — Anesthesia Preprocedure Evaluation (Signed)
Anesthesia Evaluation  Patient identified by MRN, date of birth, ID band Patient awake    Reviewed: Allergy & Precautions, NPO status , Patient's Chart, lab work & pertinent test results, reviewed documented beta blocker date and time   History of Anesthesia Complications (+) Family history of anesthesia reaction  Airway Mallampati: III  TM Distance: >3 FB     Dental  (+) Chipped   Pulmonary asthma , sleep apnea , COPD,           Cardiovascular hypertension, Pt. on medications      Neuro/Psych PSYCHIATRIC DISORDERS Anxiety Depression    GI/Hepatic GERD  Controlled,  Endo/Other  diabetes, Type 2Hypothyroidism   Renal/GU Renal disease     Musculoskeletal   Abdominal   Peds  Hematology   Anesthesia Other Findings Gout. Dementia.  Reproductive/Obstetrics                             Anesthesia Physical Anesthesia Plan  ASA: III  Anesthesia Plan: General   Post-op Pain Management:    Induction: Intravenous  Airway Management Planned: LMA  Additional Equipment:   Intra-op Plan:   Post-operative Plan:   Informed Consent: I have reviewed the patients History and Physical, chart, labs and discussed the procedure including the risks, benefits and alternatives for the proposed anesthesia with the patient or authorized representative who has indicated his/her understanding and acceptance.     Plan Discussed with: CRNA  Anesthesia Plan Comments:         Anesthesia Quick Evaluation

## 2016-11-21 NOTE — Anesthesia Procedure Notes (Signed)
Procedure Name: LMA Insertion Date/Time: 11/21/2016 7:40 AM Performed by: Doreen Salvage Pre-anesthesia Checklist: Patient identified, Patient being monitored, Timeout performed, Emergency Drugs available and Suction available Patient Re-evaluated:Patient Re-evaluated prior to inductionOxygen Delivery Method: Circle system utilized Preoxygenation: Pre-oxygenation with 100% oxygen Intubation Type: IV induction Ventilation: Mask ventilation without difficulty LMA: LMA inserted LMA Size: 3.5 Tube type: Oral Number of attempts: 1 Placement Confirmation: positive ETCO2 and breath sounds checked- equal and bilateral Tube secured with: Tape Dental Injury: Teeth and Oropharynx as per pre-operative assessment

## 2016-11-21 NOTE — Transfer of Care (Signed)
Immediate Anesthesia Transfer of Care Note  Patient: Lori Blanchard  Procedure(s) Performed: Procedure(s): CYSTOSCOPY WITH STENT REPLACEMENT (Right) CYSTOSCOPY WITH RETROGRADE PYELOGRAM (Left)  Patient Location: PACU  Anesthesia Type:General  Level of Consciousness: sedated  Airway & Oxygen Therapy: Patient Spontanous Breathing and Patient connected to face mask oxygen  Post-op Assessment: Report given to RN and Post -op Vital signs reviewed and stable  Post vital signs: Reviewed and stable  Last Vitals:  Vitals:   11/21/16 0812 11/21/16 0827  BP: (!) 145/77 (!) 148/81  Pulse: (!) 127   Resp: (!) 22 18  Temp: 92.9 C     Complications: No apparent anesthesia complications

## 2016-11-21 NOTE — H&P (Signed)
Lori Blanchard Aug 09, 1945 [id number]  Referring provider: [name], MD [facility] [place], [place] [address]      Chief Complaint  Patient presents with  . Follow-up    Ureteropelvic junction (UPJ) obstruction    HPI: The patient is a 71 year old female with a chronic right symptomatic UPJ obstruction that is treated with an indwelling ureteral stent exchanged every 2 months 2/2 calcification. She presents today for right ureteral stent exchange. Also plan for left retrograde pyelogram after recent left URS.  She had a 6 mm nonobstructing left upper pole stone that removed approximately 4 months ago. It was 77% calcium oxalate monohydrate, 3% calcium phosphate, and 20% uric acid.    PMH:     Past Medical History:  Diagnosis Date  . Alzheimer disease   . Anxiety and depression   . Asthma   . Diabetes ([place])   . Family history of adverse reaction to anesthesia    daughter has breathing problems after anesthesia  . Gout   . Hydronephrosis   . Hyperlipemia   . Hypertension   . Hypothyroidism   . Osteoarthritis   . Osteoarthritis   . Pulmonary nodule   . Retroperitoneal fibrosis   . Sleep apnea     Surgical History:      Past Surgical History:  Procedure Laterality Date  . ABDOMINAL HYSTERECTOMY     partial  . BREAST SURGERY Bilateral 1990   Reduction  . CARPAL TUNNEL RELEASE    . CESAREAN SECTION     x 3  . CYSTOSCOPY W/ RETROGRADES Right 11/02/2015   Procedure: CYSTOSCOPY WITH RETROGRADE PYELOGRAM, URETERAL STENT EXCHANGE; Surgeon: [name], MD; Location: [facility]; Service: Urology; Laterality: Right;  . CYSTOSCOPY W/ RETROGRADES Right 01/31/2016   Procedure: CYSTOSCOPY WITH RETROGRADE PYELOGRAM; Surgeon: [name], MD; Location: [facility]; Service: Urology; Laterality: Right;  . CYSTOSCOPY W/ URETERAL STENT PLACEMENT Right 05/11/2015   Procedure:  CYSTOSCOPY WITH STENT REPLACEMENT; Surgeon: [name], MD; Location: [facility]; Service: Urology; Laterality: Right;  . CYSTOSCOPY W/ URETERAL STENT PLACEMENT Right 01/31/2016   Procedure: CYSTOSCOPY, RETROGRADE PYELOGRAMS WITH STENT REPLACEMENT; Surgeon: [name], MD; Location: [facility]; Service: Urology; Laterality: Right;  . HERNIA REPAIR     umbilical  . JOINT REPLACEMENT Left 2013   TKR  . JOINT REPLACEMENT Right 2011   TKR  . LAPAROSCOPIC HYSTERECTOMY    . REPLACEMENT TOTAL KNEE Bilateral     Home Medications:        Medication List           Accurate as of 02/29/16 10:15 AM.Always use your most recent med list.           albuterol(2.5 MG/3ML) 0.083% nebulizer solution Commonly known as: PROVENTIL Inhale 2.5 mg into the lungs every 4 (four) hours as needed for wheezing or shortness of breath.  PROAIR HFA108 (90 Base) MCG/ACT inhaler Generic drug: albuterol USE 2 PUFFS EVERY FOUR HOURS AS NEEDED.  alendronate70 MG tablet Commonly known as: FOSAMAX TAKE ONE TABLET EVERY WEEK. TAKE WITH A FUUL GLASS OF WATER AND DO NOT LIE DOWN FOR THE NEXT 30 MINUTES. usually on Friday or Saturday.  aspirin EC81 MG tablet Take 81 mg by mouth every morning.  atorvastatin40 MG tablet Commonly known as: LIPITOR Take 40 mg by mouth every morning.  buPROPion150 MG 24 hr tablet Commonly known as: WELLBUTRIN XL Take 150 mg by mouth every morning.  cyclobenzaprine5 MG tablet  Commonly known as: FLEXERIL Take 1 tablet (5 mg total) by mouth every 8 (eight) hours as needed for muscle spasms.  donepezil5 MG tablet Commonly known as: ARICEPT Take 5 mg by mouth daily.  fenofibrate145 MG tablet Commonly known as: TRICOR TAKE ONE (1) TABLET EACH DAY in am  furosemide20 MG tablet Commonly known as: LASIX Take 20 mg by mouth every morning.  HYDROcodone-acetaminophen5-325 MG tablet Commonly known as: NORCO Take 1 tablet  by mouth every 6 (six) hours as needed for moderate pain.  insulin lispro100 UNIT/ML KiwkPen Commonly known as: HUMALOG Inject 10 Units into the skin 3 (three) times daily.  LANTUS SOLOSTAR100 UNIT/ML Solostar Pen Generic drug: Insulin Glargine Inject 20 Units into the skin daily at 10 pm.  levothyroxine50 MCG tablet Commonly known as: SYNTHROID, LEVOTHROID TAKE 1 TABLET ON AN EMPTY STOMACH AT LEAST 30-60 MINUTES BEFORE BREAKFAST  metFORMIN1000 MG tablet Commonly known as: GLUCOPHAGE twice a day  pantoprazole40 MG tablet Commonly known as: PROTONIX Take 40 mg by mouth every morning.  PEN NEEDLES 31GX5/16"31G X 8 MM Misc Inject into the skin.  SURE COMFORT PEN NEEDLES31G X 5 MM Misc Generic drug: Insulin Pen Needle USE 4 TIMES A DAY OR AS DIRECTED BY DOCTOR  RA VITAMIN B-12 TR1000 MCG Tbcr Generic drug: Cyanocobalamin Take 1 tablet by mouth daily.  senna8.6 MG tablet Commonly known as: SENOKOT Take 1 tablet by mouth 2 (two) times daily. At 8 am and 5 pm  telmisartan40 MG tablet Commonly known as: MICARDIS TAKE ONE (1) TABLET EACH DAY in am  vitamin E1000 UNIT capsule Take 1,000 Units by mouth daily.  vitamin E400 UNIT capsule Take 400 Units by mouth every morning.      Allergies:      Allergies  Allergen Reactions  . Ibuprofen Itching, Nausea Only and Other (See Comments)    Family History:       Family History  Problem Relation Age of Onset  . Liver cancer Mother   . Colon cancer Mother   . Breast cancer Mother   . Diabetes Daughter   . Kidney disease Daughter     adrenal tumors  . Kidney cancer Neg Hx   . Prostate cancer Neg Hx     Social History:reports that she has never smoked. She has never used smokeless tobacco. She reports that she does not drink alcohol or use drugs.  ROS: UROLOGY Frequent Urination?: No Hard to postpone urination?: No Burning/pain with urination?: No Get up at night to urinate?:  Yes Leakage of urine?: No Urine stream starts and stops?: No Trouble starting stream?: No Do you have to strain to urinate?: No Blood in urine?: No Urinary tract infection?: No Sexually transmitted disease?: No Injury to kidneys or bladder?: No Painful intercourse?: No Weak stream?: No Currently pregnant?: No Vaginal bleeding?: No Last menstrual period?: No  Gastrointestinal Nausea?: No Vomiting?: No Indigestion/heartburn?: No Diarrhea?: No Constipation?: No  Constitutional Fever: No Night sweats?: No Weight loss?: No Fatigue?: No  Skin Skin rash/lesions?: No Itching?: No  Eyes Blurred vision?: No Double vision?: No  Ears/Nose/Throat Sore throat?: No Sinus problems?: No  Hematologic/Lymphatic Swollen glands?: No Easy bruising?: No  Cardiovascular Leg swelling?: Yes Chest pain?: No  Respiratory Cough?: No Shortness of breath?: Yes  Endocrine Excessive thirst?: No  Musculoskeletal Back pain?: No Joint pain?: Yes  Neurological Headaches?: Yes Dizziness?: No  Psychologic Depression?: Yes Anxiety?: Yes  Physical Exam: BP (!) 149/72 (BP Location: Left Arm, Patient Position: Sitting, Cuff Size: Normal)  Pulse 90  Ht 4\' 8"  (1.422 m)  Wt 160 lb 11.2 oz (72.9 kg)  BMI 36.03 kg/m  Constitutional: Alert and oriented, No acute distress. HEENT: [place] AT, moist mucus membranes. Trachea midline, no masses. Cardiovascular: No clubbing, cyanosis, or edema. RRR. Respiratory:Normal respiratory effort,no increased work of breathing. Lungs clear. GI: Abdomen is soft, nontender, nondistended, no abdominal masses GU: No CVA tenderness.  Skin: No rashes, bruises or suspicious lesions. Lymph: No cervical or inguinal adenopathy. Neurologic: Grossly intact, no focal deficits, moving all 4 extremities. Psychiatric: Normal mood and affect.  Laboratory Data: RecentLabs       Lab Results  Component Value Date   WBC 7.2 12/29/2015    HGB 10.5 (L) 12/29/2015   HCT 33.2 (L) 12/29/2015   MCV 74.1 (L) 12/29/2015   PLT 286 12/29/2015      RecentLabs       Lab Results  Component Value Date   CREATININE 0.90 12/29/2015      RecentLabs  No results found for: PSA    RecentLabs  No results found for: TESTOSTERONE    RecentLabs  No results found for: HGBA1C    Urinalysis Labs(Brief)         Component Value Date/Time   COLORURINE YELLOW (A) 01/18/2016 1106   APPEARANCEUR Clear 01/23/2016 0933   LABSPEC 1.016 01/18/2016 1106   LABSPEC 1.015 08/08/2011 1106   PHURINE 6.0 01/18/2016 1106   GLUCOSEU Negative 01/23/2016 0933   GLUCOSEU Negative 08/08/2011 1106   HGBUR 1+ (A) 01/18/2016 1106   BILIRUBINUR Negative 01/23/2016 0933   BILIRUBINUR Negative 08/08/2011 1106   KETONESUR NEGATIVE 01/18/2016 1106   PROTEINUR Negative 01/23/2016 0933   PROTEINUR NEGATIVE 01/18/2016 1106   NITRITE Negative 01/23/2016 0933   NITRITE NEGATIVE 01/18/2016 1106   LEUKOCYTESUR Trace (A) 01/23/2016 0933   LEUKOCYTESUR Negative 08/08/2011 1106         Assessment &Plan:   1. Right UPJ obstruction -Right stent exchange today -also plan to due left retrograde pyelogram to r/o iatrogenic hydronephrosis after recent left URS

## 2016-11-21 NOTE — Op Note (Signed)
Date of procedure: 11/21/16  Preoperative diagnosis:  1. Right UPJ obstruction 2. History of nephrolithiasis   Postoperative diagnosis:  1. Same   Procedure: 1. Cystoscopy 2. Bilateral retrograde pyelogram with interpretation 3. Right ureteral stent exchange 6 [place] by 24 cm  Surgeon: Baruch Gouty, MD  Anesthesia: General  Complications: None  Intraoperative findings: The patient had a right UPJ obstruction on retrograde pyelogram. Left retrograde pyelogram showed good drainage without sign of filling defect or hydronephrosis. Right ureteral stent successfully exchanged.  EBL: None  Specimens: None  Drains: Right ureteral stent 6 French by 24 cm  Disposition: Stable to the postanesthesia care unit  Indication for procedure: The patient is a 71 y.o. 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 She presents today for right ureteral stent exchange and left retrograde pyelogram to rule out iatrogenic hydronephrosis.  After reviewing the management options for treatment, the patient elected to proceed with the above surgical procedure(s). We have discussed the potential benefits and risks of the procedure, side effects of the proposed treatment, the likelihood of the patient achieving the goals of the procedure, and any potential problems that might occur during the procedure or recuperation. Informed consent has been obtained.  Description of procedure: The patient was met in the preoperative area. All risks, benefits, and indications of the procedure were described in great detail. The patient consented to the procedure. Preoperative antibiotics were given. The patient was taken to the operative theater. General anesthesia was induced per the anesthesia service. The patient was then placed in the dorsal lithotomy position and prepped and draped in the usual sterile fashion. A  preoperative timeout was called.   A 21 French 30 cystoscope was inserted into the patient's bladder per urethra atraumatically. Left retrograde powder was obtained which showed no filling defects or hydronephrosis. There is good drainage of contrast. The right ureteral stent was then grasped with flexible graspers and left to level the urethral meatus. A sensor wire was exchanged through to level of the renal pelvis on the fluoroscopy. The dual-lumen catheter, a right retrograde pyelogram was used to identify the collecting system. Right UPJ obstruction was again noted. Cystoscope was then assembled over the sensor wire. A 6 French by 24 cm double-J ureteral stent was placed. A sensor wire was removed. A curl seen in the urinary bladder under full sedation under fluoroscopy in the left renal pelvis. The patient's bladder was then drained. His wound from anesthesia and transferred stable condition to the postanesthesia care unit.  Plan:  The patient will follow-up for repeat right ureteral stent exchange in 2 months due to her quickly calcifying her stents.  Baruch Gouty, M.D.

## 2016-11-21 NOTE — Anesthesia Procedure Notes (Signed)
Procedure Name: LMA Insertion Date/Time: 11/21/2016 7:52 AM Performed by: Doreen Salvage Pre-anesthesia Checklist: Patient identified, Patient being monitored, Timeout performed, Emergency Drugs available and Suction available Patient Re-evaluated:Patient Re-evaluated prior to inductionOxygen Delivery Method: Circle system utilized Preoxygenation: Pre-oxygenation with 100% oxygen Intubation Type: IV induction Ventilation: Mask ventilation without difficulty LMA: LMA inserted LMA Size: 4.0 Tube type: Oral Number of attempts: 1 Placement Confirmation: positive ETCO2 and breath sounds checked- equal and bilateral Tube secured with: Tape Dental Injury: Teeth and Oropharynx as per pre-operative assessment  Comments: Air-q removed- large leak noted- not a good seal with low tidal volumes. Ambu inflatable LMA inserted.TV improved- small leak remains

## 2016-11-23 ENCOUNTER — Other Ambulatory Visit: Payer: Self-pay | Admitting: Nurse Practitioner

## 2016-11-23 DIAGNOSIS — R413 Other amnesia: Secondary | ICD-10-CM

## 2016-11-26 ENCOUNTER — Telehealth: Payer: Self-pay | Admitting: Radiology

## 2016-11-26 NOTE — Telephone Encounter (Signed)
-----   Message from [name], MD sent at 11/21/2016  8:13 AM EDT ----- Patient needs repeat cysto, right stent exchange in 2 months. thanks

## 2016-12-03 ENCOUNTER — Ambulatory Visit
Admission: RE | Admit: 2016-12-03 | Discharge: 2016-12-03 | Disposition: A | Discharge status: Home / Self Care | Payer: Medicare Other | Source: Ambulatory Visit | Attending: Nurse Practitioner | Admitting: Nurse Practitioner

## 2016-12-03 DIAGNOSIS — I739 Peripheral vascular disease, unspecified: Secondary | ICD-10-CM | POA: Diagnosis not present

## 2016-12-03 DIAGNOSIS — R413 Other amnesia: Secondary | ICD-10-CM | POA: Diagnosis present

## 2016-12-03 LAB — POCT I-STAT CREATININE: Creatinine, Ser: 0.7 mg/dL (ref 0.44–1.00)

## 2016-12-03 MED ORDER — GADOBENATE DIMEGLUMINE 529 MG/ML IV SOLN
20.0000 mL | Freq: Once | INTRAVENOUS | Status: AC | PRN
  Administered 2016-12-03: 15 mL via INTRAVENOUS

## 2016-12-24 ENCOUNTER — Other Ambulatory Visit: Payer: Self-pay | Admitting: Radiology

## 2016-12-24 DIAGNOSIS — N135 Crossing vessel and stricture of ureter without hydronephrosis: Secondary | ICD-10-CM

## 2016-12-24 NOTE — Telephone Encounter (Signed)
Notified pt of surgery scheduled with Dr Pilar Jarvis on 01/25/17, pre-admit testing appt on 01/11/17 @8 :60 & RTC following pre-admit appt for ucx. Advised pt to call day prior to surgery for arrival time to SDS. Pt voices understanding.

## 2017-01-11 ENCOUNTER — Encounter
Admission: RE | Admit: 2017-01-11 | Discharge: 2017-01-11 | Disposition: A | Discharge status: Home / Self Care | Payer: Medicare Other | Source: Ambulatory Visit | Attending: Urology | Admitting: Urology

## 2017-01-11 ENCOUNTER — Ambulatory Visit (INDEPENDENT_AMBULATORY_CARE_PROVIDER_SITE_OTHER): Payer: Medicare Other | Admitting: *Deleted

## 2017-01-11 VITALS — BP 172/72 | HR 91 | Ht <= 58 in | Wt 164.5 lb

## 2017-01-11 DIAGNOSIS — N390 Urinary tract infection, site not specified: Secondary | ICD-10-CM | POA: Insufficient documentation

## 2017-01-11 DIAGNOSIS — Z01818 Encounter for other preprocedural examination: Secondary | ICD-10-CM | POA: Diagnosis not present

## 2017-01-11 HISTORY — DX: Diverticulosis of intestine, part unspecified, without perforation or abscess without bleeding: K57.90

## 2017-01-11 LAB — BASIC METABOLIC PANEL WITH GFR
Anion gap: 10 (ref 5–15)
BUN: 23 mg/dL — ABNORMAL HIGH (ref 6–20)
CO2: 24 mmol/L (ref 22–32)
Calcium: 9.1 mg/dL (ref 8.9–10.3)
Chloride: 107 mmol/L (ref 101–111)
Creatinine, Ser: 0.75 mg/dL (ref 0.44–1.00)
GFR calc Af Amer: >60 mL/min
GFR calc non Af Amer: >60 mL/min
Glucose, Bld: 111 mg/dL — ABNORMAL HIGH (ref 65–99)
Potassium: 4 mmol/L (ref 3.5–5.1)
Sodium: 141 mmol/L (ref 135–145)

## 2017-01-11 LAB — CBC
HEMATOCRIT: 32.3 % — AB (ref 35.0–47.0)
HEMOGLOBIN: 10.3 g/dL — AB (ref 12.0–16.0)
MCH: 22.3 pg — ABNORMAL LOW (ref 26.0–34.0)
MCHC: 31.8 g/dL — ABNORMAL LOW (ref 32.0–36.0)
MCV: 70 fL — ABNORMAL LOW (ref 80.0–100.0)
Platelets: 309 10*3/uL (ref 150–440)
RBC: 4.61 MIL/uL (ref 3.80–5.20)
RDW: 16.6 % — ABNORMAL HIGH (ref 11.5–14.5)
WBC: 10.4 10*3/uL (ref 3.6–11.0)

## 2017-01-11 LAB — URINALYSIS, COMPLETE
BILIRUBIN UA: NEGATIVE
GLUCOSE, UA: NEGATIVE
Ketones, UA: NEGATIVE
Leukocytes, UA: NEGATIVE
NITRITE UA: NEGATIVE
Protein, UA: NEGATIVE
RBC, UA: NEGATIVE
Specific Gravity, UA: 1.015 (ref 1.005–1.030)
Urobilinogen, Ur: 0.2 mg/dL (ref 0.2–1.0)
pH, UA: 6.5 (ref 5.0–7.5)

## 2017-01-11 NOTE — Addendum Note (Signed)
Addended by: Milta Deiters on: 01/11/2017 10:18 AM   Modules accepted: Orders

## 2017-01-11 NOTE — Progress Notes (Signed)
In and Out Catheterization  Patient is present today for a I & O catheterization due to dysuria. Patient was cleaned and prepped in a sterile fashion with betadine and Lidocaine 2% jelly was instilled into the urethra.  A 14FR cath was inserted no complications were noted , 54ml of urine return was noted, urine was yellow in color. A clean urine sample was collected for urinalysis. Bladder was drained and catheter was removed with out difficulty.    Preformed by: Lyndee Hensen CMA

## 2017-01-11 NOTE — Patient Instructions (Signed)
  Your procedure is scheduled CH:ENIDPO January 25, 2017. Report to Same Day Surgery. To find out your arrival time please call [phone] between 1PM - 3PM on Thursday January 24, 2017 .  Remember: Instructions that are not followed completely may result in serious medical risk, up to and including death, or upon the discretion of your surgeon and anesthesiologist your surgery may need to be rescheduled.    _x___ 1. Do not eat food or drink liquids after midnight. No gum chewing or hard candies.     ____ 2. No Alcohol for 24 hours before or after surgery.   ____ 3. Bring all medications with you on the day of surgery if instructed.    __x__ 4. Notify your doctor if there is any change in your medical condition     (cold, fever, infections).    _____ 5. No smoking 24 hours prior to surgery.     Do not wear jewelry, make-up, hairpins, clips or nail polish.  Do not wear lotions, powders, or perfumes.   Do not shave 48 hours prior to surgery. Men may shave face and neck.  Do not bring valuables to the hospital.    [facility] is not responsible for any belongings or valuables.               Contacts, dentures or bridgework may not be worn into surgery.  Leave your suitcase in the car. After surgery it may be brought to your room.  For patients admitted to the hospital, discharge time is determined by your treatment team.   Patients discharged the day of surgery will not be allowed to drive home.    Please read over the following fact sheets that you were given:   [facility] Preparing for Surgery  _x__ Take these medicines the morning of surgery with A SIP OF WATER:    1. Atorvastatin/ Lipitor  2. Bupropion/ Wellbutrin  3. Donepezil/ Aricept  4.Syntrhoid/ Levothyroxine  5.  6.  ____ Fleet Enema (as directed)   ____ Use CHG Soap as directed on instruction sheet  _x___ Use inhalers on the day of surgery and bring to hospital day of surgery  __x__ Stop metformin 2 days prior to  surgery on January 23, 2017.    __x__ Take 1/2 of usual Lantus insulin dose the night before surgery and none on the morning of surgery.   _x___ Stop aspirin on June 17th, 2018.  _x___ Stop Anti-inflammatories such as Advil, Aleve, Ibuprofen, Motrin, Naproxen, Naprosyn, Goodies powders or aspirin products. OK to take Tylenol.   ____ Stop supplements until after surgery.    ____ Bring C-Pap to the hospital.

## 2017-01-11 NOTE — Pre-Procedure Instructions (Signed)
Message left on Lori Blanchard's voicemail that we need an H+P for pt's upcoming surgery.

## 2017-01-15 ENCOUNTER — Other Ambulatory Visit: Payer: Self-pay | Admitting: Radiology

## 2017-01-15 DIAGNOSIS — Z01818 Encounter for other preprocedural examination: Secondary | ICD-10-CM

## 2017-01-15 NOTE — Pre-Procedure Instructions (Signed)
Spoke with Amy at Dr. Carlynn Purl office, he will do H+P the am of surgery.

## 2017-01-16 LAB — CULTURE, URINE COMPREHENSIVE

## 2017-01-24 MED ORDER — CEFAZOLIN SODIUM-DEXTROSE 2-4 GM/100ML-% IV SOLN
2.0000 g | INTRAVENOUS | Status: AC
Start: 2017-01-24 — End: 2017-01-25
  Administered 2017-01-25: 2 g via INTRAVENOUS

## 2017-01-25 ENCOUNTER — Ambulatory Visit
Admission: RE | Admit: 2017-01-25 | Discharge: 2017-01-25 | Disposition: A | Discharge status: Home / Self Care | Payer: Medicare Other | Source: Ambulatory Visit | Attending: Urology | Admitting: Urology

## 2017-01-25 ENCOUNTER — Encounter: Payer: Self-pay | Admitting: Anesthesiology

## 2017-01-25 ENCOUNTER — Ambulatory Visit: Payer: Medicare Other | Admitting: Anesthesiology

## 2017-01-25 ENCOUNTER — Encounter
Admission: RE | Disposition: A | Discharge status: Home / Self Care | Payer: Self-pay | Source: Ambulatory Visit | Attending: Urology

## 2017-01-25 DIAGNOSIS — K219 Gastro-esophageal reflux disease without esophagitis: Secondary | ICD-10-CM | POA: Insufficient documentation

## 2017-01-25 DIAGNOSIS — N135 Crossing vessel and stricture of ureter without hydronephrosis: Secondary | ICD-10-CM | POA: Diagnosis not present

## 2017-01-25 DIAGNOSIS — E039 Hypothyroidism, unspecified: Secondary | ICD-10-CM | POA: Insufficient documentation

## 2017-01-25 DIAGNOSIS — Z7983 Long term (current) use of bisphosphonates: Secondary | ICD-10-CM | POA: Insufficient documentation

## 2017-01-25 DIAGNOSIS — J449 Chronic obstructive pulmonary disease, unspecified: Secondary | ICD-10-CM | POA: Insufficient documentation

## 2017-01-25 DIAGNOSIS — G4730 Sleep apnea, unspecified: Secondary | ICD-10-CM | POA: Diagnosis not present

## 2017-01-25 DIAGNOSIS — F329 Major depressive disorder, single episode, unspecified: Secondary | ICD-10-CM | POA: Diagnosis not present

## 2017-01-25 DIAGNOSIS — Z794 Long term (current) use of insulin: Secondary | ICD-10-CM | POA: Diagnosis not present

## 2017-01-25 DIAGNOSIS — E785 Hyperlipidemia, unspecified: Secondary | ICD-10-CM | POA: Diagnosis not present

## 2017-01-25 DIAGNOSIS — G309 Alzheimer's disease, unspecified: Secondary | ICD-10-CM | POA: Diagnosis not present

## 2017-01-25 DIAGNOSIS — I10 Essential (primary) hypertension: Secondary | ICD-10-CM | POA: Diagnosis not present

## 2017-01-25 DIAGNOSIS — Z886 Allergy status to analgesic agent status: Secondary | ICD-10-CM | POA: Diagnosis not present

## 2017-01-25 DIAGNOSIS — Z79899 Other long term (current) drug therapy: Secondary | ICD-10-CM | POA: Insufficient documentation

## 2017-01-25 DIAGNOSIS — F0280 Dementia in other diseases classified elsewhere without behavioral disturbance: Secondary | ICD-10-CM | POA: Insufficient documentation

## 2017-01-25 DIAGNOSIS — Z96653 Presence of artificial knee joint, bilateral: Secondary | ICD-10-CM | POA: Insufficient documentation

## 2017-01-25 DIAGNOSIS — Z7982 Long term (current) use of aspirin: Secondary | ICD-10-CM | POA: Diagnosis not present

## 2017-01-25 DIAGNOSIS — E119 Type 2 diabetes mellitus without complications: Secondary | ICD-10-CM | POA: Diagnosis not present

## 2017-01-25 HISTORY — PX: CYSTOSCOPY W/ URETERAL STENT PLACEMENT: SHX1429

## 2017-01-25 LAB — GLUCOSE, CAPILLARY: GLUCOSE-CAPILLARY: 160 mg/dL — AB (ref 65–99)

## 2017-01-25 SURGERY — CYSTOSCOPY, FLEXIBLE, WITH STENT REPLACEMENT
Anesthesia: General | Laterality: Right

## 2017-01-25 MED ORDER — CEFAZOLIN SODIUM-DEXTROSE 2-4 GM/100ML-% IV SOLN
INTRAVENOUS | Status: AC
  Filled 2017-01-25: qty 100

## 2017-01-25 MED ORDER — FENTANYL CITRATE (PF) 100 MCG/2ML IJ SOLN
INTRAMUSCULAR | Status: DC | PRN
  Administered 2017-01-25 (×2): 50 ug via INTRAVENOUS

## 2017-01-25 MED ORDER — CEPHALEXIN 500 MG PO CAPS: 500.0000 mg | ORAL_CAPSULE | Freq: Three times a day (TID) | ORAL | 0 refills | Status: DC

## 2017-01-25 MED ORDER — SODIUM CHLORIDE 0.9 % IV SOLN
INTRAVENOUS | Status: DC
  Administered 2017-01-25: 08:00:00 via INTRAVENOUS

## 2017-01-25 MED ORDER — FAMOTIDINE 20 MG PO TABS
20.0000 mg | ORAL_TABLET | Freq: Once | ORAL | Status: AC
  Administered 2017-01-25: 20 mg via ORAL

## 2017-01-25 MED ORDER — ONDANSETRON HCL 4 MG/2ML IJ SOLN
INTRAMUSCULAR | Status: DC | PRN
  Administered 2017-01-25: 4 mg via INTRAVENOUS

## 2017-01-25 MED ORDER — PHENYLEPHRINE HCL 10 MG/ML IJ SOLN
INTRAMUSCULAR | Status: DC | PRN
  Administered 2017-01-25 (×2): 160 ug via INTRAVENOUS

## 2017-01-25 MED ORDER — HYDROCODONE-ACETAMINOPHEN 5-325 MG PO TABS: 1.0000 | ORAL_TABLET | ORAL | 0 refills | Status: DC | PRN

## 2017-01-25 MED ORDER — FENTANYL CITRATE (PF) 100 MCG/2ML IJ SOLN
INTRAMUSCULAR | Status: AC
  Filled 2017-01-25: qty 2

## 2017-01-25 MED ORDER — LACTATED RINGERS IV SOLN
INTRAVENOUS | Status: DC | PRN
  Administered 2017-01-25: 09:00:00 via INTRAVENOUS

## 2017-01-25 MED ORDER — ONDANSETRON HCL 4 MG/2ML IJ SOLN: 4.0000 mg | Freq: Once | INTRAMUSCULAR | Status: DC | PRN

## 2017-01-25 MED ORDER — LIDOCAINE HCL (CARDIAC) 20 MG/ML IV SOLN
INTRAVENOUS | Status: DC | PRN
  Administered 2017-01-25: 100 mg via INTRAVENOUS

## 2017-01-25 MED ORDER — FAMOTIDINE 20 MG PO TABS
ORAL_TABLET | ORAL | Status: AC
  Administered 2017-01-25: 20 mg via ORAL
  Filled 2017-01-25: qty 1

## 2017-01-25 MED ORDER — PROPOFOL 10 MG/ML IV BOLUS
INTRAVENOUS | Status: AC
  Filled 2017-01-25: qty 40

## 2017-01-25 MED ORDER — FENTANYL CITRATE (PF) 100 MCG/2ML IJ SOLN: 25.0000 ug | INTRAMUSCULAR | Status: DC | PRN

## 2017-01-25 MED ORDER — PROPOFOL 10 MG/ML IV BOLUS
INTRAVENOUS | Status: DC | PRN
  Administered 2017-01-25: 120 mg via INTRAVENOUS

## 2017-01-25 SURGICAL SUPPLY — 18 items
BACTOSHIELD CHG 4% 4OZ (MISCELLANEOUS) ×2
GLOVE BIO SURGEON STRL SZ7.5 (GLOVE) ×3 IMPLANT
GOWN STRL REUS W/ TWL LRG LVL4 (GOWN DISPOSABLE) ×1 IMPLANT
GOWN STRL REUS W/TWL LRG LVL4 (GOWN DISPOSABLE) ×3
GOWN STRL REUS W/TWL XL LVL3 (GOWN DISPOSABLE) ×3 IMPLANT
KIT RM TURNOVER CYSTO AR (KITS) ×3 IMPLANT
PACK CYSTO AR (MISCELLANEOUS) ×3 IMPLANT
SCRUB CHG 4% DYNA-HEX 4OZ (MISCELLANEOUS) ×1 IMPLANT
SENSORWIRE 0.038 NOT ANGLED (WIRE) ×3
SET CYSTO W/LG BORE CLAMP LF (SET/KITS/TRAYS/PACK) ×3 IMPLANT
SOL .9 NS 3000ML IRR  AL (IV SOLUTION) ×2
SOL .9 NS 3000ML IRR AL (IV SOLUTION) ×1
SOL .9 NS 3000ML IRR UROMATIC (IV SOLUTION) ×1 IMPLANT
STENT URET 6FRX24 CONTOUR (STENTS) ×3 IMPLANT
STENT URET 6FRX26 CONTOUR (STENTS) IMPLANT
SURGILUBE 2OZ TUBE FLIPTOP (MISCELLANEOUS) ×3 IMPLANT
WATER STERILE IRR 1000ML POUR (IV SOLUTION) ×3 IMPLANT
WIRE SENSOR 0.038 NOT ANGLED (WIRE) ×1 IMPLANT

## 2017-01-25 NOTE — Anesthesia Preprocedure Evaluation (Signed)
Anesthesia Evaluation  Patient identified by MRN, date of birth, ID band Patient awake    Reviewed: Allergy & Precautions, H&P , NPO status , Patient's Chart, lab work & pertinent test results  History of Anesthesia Complications Negative for: history of anesthetic complications  Airway Mallampati: III  TM Distance: <3 FB Neck ROM: limited    Dental  (+) Poor Dentition, Chipped, Missing   Pulmonary shortness of breath and with exertion, asthma , sleep apnea and Continuous Positive Airway Pressure Ventilation , COPD, neg recent URI,           Cardiovascular Exercise Tolerance: Good hypertension, (-) angina(-) Past MI      Neuro/Psych PSYCHIATRIC DISORDERS negative neurological ROS     GI/Hepatic Neg liver ROS, GERD  ,  Endo/Other  negative endocrine ROSdiabetes, Well Controlled, Type 2Hypothyroidism   Renal/GU Renal disease     Musculoskeletal  (+) Arthritis ,   Abdominal   Peds  Hematology negative hematology ROS (+)   Anesthesia Other Findings Past Medical History: No date: Alzheimer disease No date: Anxiety and depression No date: Asthma No date: COPD (chronic obstructive pulmonary disease) (* No date: Depression No date: Diabetes (HCC) No date: Family history of adverse reaction to anesthes*     Comment: daughter has breathing problems after               anesthesia No date: Gout No date: History of kidney stones No date: Hydronephrosis No date: Hyperlipemia No date: Hypertension No date: Hypothyroidism No date: Osteoarthritis No date: Osteoarthritis No date: Pulmonary nodule 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 No date: Sleep apnea  Past Surgical History: No date: ABDOMINAL HYSTERECTOMY     Comment: partial 1965: APPENDECTOMY 1990: BREAST SURGERY Bilateral     Comment: Reduction No date: CARPAL TUNNEL RELEASE No date: CESAREAN SECTION     Comment: x 3 11/02/2015: CYSTOSCOPY W/ RETROGRADES  Right     Comment: Procedure: CYSTOSCOPY WITH RETROGRADE               PYELOGRAM, URETERAL STENT EXCHANGE;  Surgeon:               [name], MD;  Location: [facility];                Service: Urology;  Laterality: Right; 01/31/2016: CYSTOSCOPY W/ RETROGRADES Right     Comment: Procedure: CYSTOSCOPY WITH RETROGRADE               PYELOGRAM;  Surgeon: [name], MD;                Location: [facility];  Service: Urology;                Laterality: Right; 05/09/2016: CYSTOSCOPY W/ RETROGRADES Bilateral     Comment: Procedure: CYSTOSCOPY WITH RETROGRADE               PYELOGRAM;  Surgeon: [name], MD;                Location: [facility];  Service: Urology;                Laterality: Bilateral; 05/11/2015: CYSTOSCOPY W/ URETERAL STENT PLACEMENT Right     Comment: Procedure: CYSTOSCOPY WITH STENT REPLACEMENT;               Surgeon: [name], MD;  Location:               [facility];  Service: Urology;  Laterality:  Right; 01/31/2016: CYSTOSCOPY W/ URETERAL STENT PLACEMENT Right     Comment: Procedure: CYSTOSCOPY, RETROGRADE PYELOGRAMS               WITH STENT REPLACEMENT;  Surgeon: [name], MD;  Location: [facility];  Service:               Urology;  Laterality: Right; 05/09/2016: CYSTOSCOPY W/ URETERAL STENT PLACEMENT Right     Comment: Procedure: CYSTOSCOPY WITH STENT REPLACEMENT;               Surgeon: [name], MD;  Location:               [facility];  Service: Urology;  Laterality:               Right; 07/04/2016: CYSTOSCOPY W/ URETERAL STENT PLACEMENT Bilateral     Comment: Procedure: CYSTOSCOPY WITH STENT REPLACEMENT;               Surgeon: [name], MD;  Location:               [facility];  Service: Urology;  Laterality:               Bilateral; 05/09/2016: CYSTOSCOPY WITH STENT PLACEMENT Left     Comment: Procedure: CYSTOSCOPY WITH STENT PLACEMENT;                Surgeon: [name], MD;   Location:               [facility];  Service: Urology;  Laterality: Left; No date: HERNIA REPAIR     Comment: umbilical 1610: JOINT REPLACEMENT Left     Comment: TKR 2011: JOINT REPLACEMENT Right     Comment: TKR No date: LAPAROSCOPIC HYSTERECTOMY No date: REDUCTION MAMMAPLASTY No date: REPLACEMENT TOTAL KNEE Bilateral 05/09/2016: URETEROSCOPY Bilateral     Comment: Procedure: URETEROSCOPY;  Surgeon: [name], MD;  Location: [facility];  Service:               Urology;  Laterality: Bilateral; 07/04/2016: URETEROSCOPY WITH HOLMIUM LASER LITHOTRIPSY Left     Comment: Procedure: URETEROSCOPY WITH HOLMIUM LASER               LITHOTRIPSY;  Surgeon: [name], MD;               Location: [facility];  Service: Urology;                Laterality: Left;  BMI    Body Mass Index:  34.19 kg/m      Reproductive/Obstetrics negative OB ROS                             Anesthesia Physical  Anesthesia Plan  ASA: III  Anesthesia Plan: General LMA   Post-op Pain Management:    Induction: Intravenous  PONV Risk Score and Plan: 3 and Ondansetron, Dexamethasone and Treatment may vary due to age or medical condition  Airway Management Planned: LMA  Additional Equipment:   Intra-op Plan:   Post-operative Plan:   Informed Consent: I have reviewed the patients History and Physical, chart, labs and discussed the procedure including the risks, benefits and alternatives for the proposed anesthesia with the patient or authorized representative  who has indicated his/her understanding and acceptance.   Dental Advisory Given  Plan Discussed with: Anesthesiologist, CRNA and Surgeon  Anesthesia Plan Comments:         Anesthesia Quick Evaluation

## 2017-01-25 NOTE — Anesthesia Procedure Notes (Signed)
Procedure Name: LMA Insertion Date/Time: 01/25/2017 8:56 AM Performed by: Tamala Julian, Darwin Rothlisberger Pre-anesthesia Checklist: Emergency Drugs available, Patient identified, Suction available, Patient being monitored and Timeout performed Patient Re-evaluated:Patient Re-evaluated prior to inductionOxygen Delivery Method: Circle system utilized Preoxygenation: Pre-oxygenation with 100% oxygen Intubation Type: IV induction Ventilation: Mask ventilation without difficulty LMA: LMA inserted LMA Size: 3.0 Tube size: 3.0 mm Number of attempts: 2 (attempted w/3.5LMA first)

## 2017-01-25 NOTE — Discharge Instructions (Signed)
AMBULATORY SURGERY  DISCHARGE INSTRUCTIONS   1) The drugs that you were given will stay in your system until tomorrow so for the next 24 hours you should not:  A) Drive an automobile B) Make any legal decisions C) Drink any alcoholic beverage   2) You may resume regular meals tomorrow.  Today it is better to start with liquids and gradually work up to solid foods.  You may eat anything you prefer, but it is better to start with liquids, then soup and crackers, and gradually work up to solid foods.   3) Please notify your doctor immediately if you have any unusual bleeding, trouble breathing, redness and pain at the surgery site, drainage, fever, or pain not relieved by medication.    4) Additional Instructions:        Please contact your physician with any problems or Same Day Surgery at [phone], Monday through Friday 6 am to 4 pm, or [place] at [facility] number at [phone]. Cystoscopy, Care After Refer to this sheet in the next few weeks. These instructions provide you with information about caring for yourself after your procedure. Your health care provider may also give you more specific instructions. Your treatment has been planned according to current medical practices, but problems sometimes occur. Call your health care provider if you have any problems or questions after your procedure. What can I expect after the procedure? After the procedure, it is common to have:  Mild pain when you urinate. Pain should stop within a few minutes after you urinate. This may last for up to 1 week.  A small amount of blood in your urine for several days.  Feeling like you need to urinate but producing only a small amount of urine.  Follow these instructions at home:  Medicines  Take over-the-counter and prescription medicines only as told by your health care provider.  If you were prescribed an antibiotic medicine, take it as told by your health care provider. Do  not stop taking the antibiotic even if you start to feel better. General instructions   Return to your normal activities as told by your health care provider. Ask your health care provider what activities are safe for you.  Do not drive for 24 hours if you received a sedative.  Watch for any blood in your urine. If the amount of blood in your urine increases, call your health care provider.  Follow instructions from your health care provider about eating or drinking restrictions.  If a tissue sample was removed for testing (biopsy) during your procedure, it is your responsibility to get your test results. Ask your health care provider or the department performing the test when your results will be ready.  Drink enough fluid to keep your urine clear or pale yellow.  Keep all follow-up visits as told by your health care provider. This is important. Contact a health care provider if:  You have pain that gets worse or does not get better with medicine, especially pain when you urinate.  You have difficulty urinating. Get help right away if:  You have more blood in your urine.  You have blood clots in your urine.  You have abdominal pain.  You have a fever or chills.  You are unable to urinate. This information is not intended to replace advice given to you by your health care provider. Make sure you discuss any questions you have with your health care provider. Document Released: 02/09/2005 Document Revised: 12/29/2015 Document Reviewed:  06/09/2015 Elsevier Interactive Patient Education  2017 [employer].  Kidney Stones Kidney stones (urolithiasis) are rock-like masses that form inside of the kidneys. Kidneys are organs that make pee (urine). A kidney stone can cause very bad pain and can block the flow of pee. The stone usually leaves your body (passes) through your pee. You may need to have a doctor take out the stone. Follow these instructions at home: Eating and  drinking  Drink enough fluid to keep your pee clear or pale yellow. This will help you pass the stone.  If told by your doctor, change the foods you eat (your diet). This may include: ? Limiting how much salt (sodium) you eat. ? Eating more fruits and vegetables. ? Limiting how much meat, poultry, fish, and eggs you eat.  Follow instructions from your doctor about eating or drinking restrictions. General instructions  Collect pee samples as told by your doctor. You may need to collect a pee sample: ? 24 hours after a stone comes out. ? 8-12 weeks after a stone comes out, and every 6-12 months after that.  Strain your pee every time you pee (urinate), for as long as told. Use the strainer that your doctor recommends.  Do not throw out the stone. Keep it so that it can be tested by your doctor.  Take over-the-counter and prescription medicines only as told by your doctor.  Keep all follow-up visits as told by your doctor. This is important. You may need follow-up tests. Preventing kidney stones To prevent another kidney stone:  Drink enough fluid to keep your pee clear or pale yellow. This is the best way to prevent kidney stones.  Eat healthy foods.  Avoid certain foods as told by your doctor. You may be told to eat less protein.  Stay at a healthy weight.  Contact a doctor if:  You have pain that gets worse or does not get better with medicine. Get help right away if:  You have a fever or chills.  You get very bad pain.  You get new pain in your belly (abdomen).  You pass out (faint).  You cannot pee. This information is not intended to replace advice given to you by your health care provider. Make sure you discuss any questions you have with your health care provider. Document Released: 01/09/2008 Document Revised: 04/10/2016 Document Reviewed: 04/10/2016 Elsevier Interactive Patient Education  2017 [employer].

## 2017-01-25 NOTE — Anesthesia Postprocedure Evaluation (Signed)
Anesthesia Post Note  Patient: Lori Blanchard  Procedure(s) Performed: Procedure(s) (LRB): CYSTOSCOPY WITH STENT REPLACEMENT (Right)  Patient location during evaluation: PACU Anesthesia Type: General Level of consciousness: awake and alert Pain management: pain level controlled Vital Signs Assessment: post-procedure vital signs reviewed and stable Respiratory status: spontaneous breathing, nonlabored ventilation, respiratory function stable and patient connected to nasal cannula oxygen Cardiovascular status: blood pressure returned to baseline and stable Postop Assessment: no signs of nausea or vomiting Anesthetic complications: no     Last Vitals:  Vitals:   01/25/17 0958 01/25/17 1006  BP: (!) 146/60 133/63  Pulse: 86 84  Resp: 20   Temp: 36.7 C     Last Pain:  Vitals:   01/25/17 0915  TempSrc:   PainSc: Asleep                 Martha Clan

## 2017-01-25 NOTE — H&P (Signed)
Lori Blanchard 05-29-1946 [id number]  Referring provider: [name], MD [place] [facility] [address], [place] [address]      Chief Complaint  Patient presents with  . Follow-up    Ureteropelvic junction (UPJ) obstruction    HPI: The patient is a 71 year old female with a chronic right symptomatic UPJ obstruction that is treated with an indwelling ureteral stent exchanged every 2 months 2/2 calcification due to her multiple medical co-morbidities. She presents today for right ureteral stent exchange.   She had a 6 mm nonobstructing left upper pole stone that removed approximately 6 months ago. It was 77% calcium oxalate monohydrate, 3% calcium phosphate, and 20% uric acid.    PMH:     Past Medical History:  Diagnosis Date  . Alzheimer disease   . Anxiety and depression   . Asthma   . Diabetes ([place])   . Family history of adverse reaction to anesthesia    daughter has breathing problems after anesthesia  . Gout   . Hydronephrosis   . Hyperlipemia   . Hypertension   . Hypothyroidism   . Osteoarthritis   . Osteoarthritis   . Pulmonary nodule   . Retroperitoneal fibrosis   . Sleep apnea     Surgical History:      Past Surgical History:  Procedure Laterality Date  . ABDOMINAL HYSTERECTOMY     partial  . BREAST SURGERY Bilateral 1990   Reduction  . CARPAL TUNNEL RELEASE    . CESAREAN SECTION     x 3  . CYSTOSCOPY W/ RETROGRADES Right 11/02/2015   Procedure: CYSTOSCOPY WITH RETROGRADE PYELOGRAM, URETERAL STENT EXCHANGE; Surgeon: [name], MD; Location: [facility]; Service: Urology; Laterality: Right;  . CYSTOSCOPY W/ RETROGRADES Right 01/31/2016   Procedure: CYSTOSCOPY WITH RETROGRADE PYELOGRAM; Surgeon: [name], MD; Location: [facility]; Service: Urology; Laterality: Right;  . CYSTOSCOPY W/ URETERAL STENT PLACEMENT Right 05/11/2015   Procedure: CYSTOSCOPY WITH STENT  REPLACEMENT; Surgeon: [name], MD; Location: [facility]; Service: Urology; Laterality: Right;  . CYSTOSCOPY W/ URETERAL STENT PLACEMENT Right 01/31/2016   Procedure: CYSTOSCOPY, RETROGRADE PYELOGRAMS WITH STENT REPLACEMENT; Surgeon: [name], MD; Location: [facility]; Service: Urology; Laterality: Right;  . HERNIA REPAIR     umbilical  . JOINT REPLACEMENT Left 2013   TKR  . JOINT REPLACEMENT Right 2011   TKR  . LAPAROSCOPIC HYSTERECTOMY    . REPLACEMENT TOTAL KNEE Bilateral     Home Medications:        Medication List           Accurate as of 02/29/16 10:15 AM.Always use your most recent med list.           albuterol(2.5 MG/3ML) 0.083% nebulizer solution Commonly known as: PROVENTIL Inhale 2.5 mg into the lungs every 4 (four) hours as needed for wheezing or shortness of breath.  PROAIR HFA108 (90 Base) MCG/ACT inhaler Generic drug: albuterol USE 2 PUFFS EVERY FOUR HOURS AS NEEDED.  alendronate70 MG tablet Commonly known as: FOSAMAX TAKE ONE TABLET EVERY WEEK. TAKE WITH A FUUL GLASS OF WATER AND DO NOT LIE DOWN FOR THE NEXT 30 MINUTES. usually on Friday or Saturday.  aspirin EC81 MG tablet Take 81 mg by mouth every morning.  atorvastatin40 MG tablet Commonly known as: LIPITOR Take 40 mg by mouth every morning.  buPROPion150 MG 24 hr tablet Commonly known as: WELLBUTRIN XL Take 150 mg by mouth every morning.  cyclobenzaprine5 MG tablet Commonly known as:  FLEXERIL Take 1 tablet (5 mg total) by mouth every 8 (eight) hours as needed for muscle spasms.  donepezil5 MG tablet Commonly known as: ARICEPT Take 5 mg by mouth daily.  fenofibrate145 MG tablet Commonly known as: TRICOR TAKE ONE (1) TABLET EACH DAY in am  furosemide20 MG tablet Commonly known as: LASIX Take 20 mg by mouth every morning.  HYDROcodone-acetaminophen5-325 MG tablet Commonly known as: NORCO Take 1 tablet by mouth every 6 (six)  hours as needed for moderate pain.  insulin lispro100 UNIT/ML KiwkPen Commonly known as: HUMALOG Inject 10 Units into the skin 3 (three) times daily.  LANTUS SOLOSTAR100 UNIT/ML Solostar Pen Generic drug: Insulin Glargine Inject 20 Units into the skin daily at 10 pm.  levothyroxine50 MCG tablet Commonly known as: SYNTHROID, LEVOTHROID TAKE 1 TABLET ON AN EMPTY STOMACH AT LEAST 30-60 MINUTES BEFORE BREAKFAST  metFORMIN1000 MG tablet Commonly known as: GLUCOPHAGE twice a day  pantoprazole40 MG tablet Commonly known as: PROTONIX Take 40 mg by mouth every morning.  PEN NEEDLES 31GX5/16"31G X 8 MM Misc Inject into the skin.  SURE COMFORT PEN NEEDLES31G X 5 MM Misc Generic drug: Insulin Pen Needle USE 4 TIMES A DAY OR AS DIRECTED BY DOCTOR  RA VITAMIN B-12 TR1000 MCG Tbcr Generic drug: Cyanocobalamin Take 1 tablet by mouth daily.  senna8.6 MG tablet Commonly known as: SENOKOT Take 1 tablet by mouth 2 (two) times daily. At 8 am and 5 pm  telmisartan40 MG tablet Commonly known as: MICARDIS TAKE ONE (1) TABLET EACH DAY in am  vitamin E1000 UNIT capsule Take 1,000 Units by mouth daily.  vitamin E400 UNIT capsule Take 400 Units by mouth every morning.      Allergies:      Allergies  Allergen Reactions  . Ibuprofen Itching, Nausea Only and Other (See Comments)    Family History:       Family History  Problem Relation Age of Onset  . Liver cancer Mother   . Colon cancer Mother   . Breast cancer Mother   . Diabetes Daughter   . Kidney disease Daughter     adrenal tumors  . Kidney cancer Neg Hx   . Prostate cancer Neg Hx     Social History:reports that she has never smoked. She has never used smokeless tobacco. She reports that she does not drink alcohol or use drugs.  ROS: UROLOGY Frequent Urination?: No Hard to postpone urination?: No Burning/pain with urination?: No Get up at night to urinate?: Yes Leakage of  urine?: No Urine stream starts and stops?: No Trouble starting stream?: No Do you have to strain to urinate?: No Blood in urine?: No Urinary tract infection?: No Sexually transmitted disease?: No Injury to kidneys or bladder?: No Painful intercourse?: No Weak stream?: No Currently pregnant?: No Vaginal bleeding?: No Last menstrual period?: No  Gastrointestinal Nausea?: No Vomiting?: No Indigestion/heartburn?: No Diarrhea?: No Constipation?: No  Constitutional Fever: No Night sweats?: No Weight loss?: No Fatigue?: No  Skin Skin rash/lesions?: No Itching?: No  Eyes Blurred vision?: No Double vision?: No  Ears/Nose/Throat Sore throat?: No Sinus problems?: No  Hematologic/Lymphatic Swollen glands?: No Easy bruising?: No  Cardiovascular Leg swelling?: Yes Chest pain?: No  Respiratory Cough?: No Shortness of breath?: Yes  Endocrine Excessive thirst?: No  Musculoskeletal Back pain?: No Joint pain?: Yes  Neurological Headaches?: Yes Dizziness?: No  Psychologic Depression?: Yes Anxiety?: Yes  Physical Exam: BP (!) 149/72 (BP Location: Left Arm, Patient Position: Sitting, Cuff Size: Normal)  Pulse 90  Ht 4\' 8"  (1.422 m)  Wt 160 lb 11.2 oz (72.9 kg)  BMI 36.03 kg/m  Constitutional: Alert and oriented, No acute distress. HEENT: [place] AT, moist mucus membranes. Trachea midline, no masses. Cardiovascular: No clubbing, cyanosis, or edema. RRR. Respiratory:Normal respiratory effort,no increased work of breathing. Lungs clear. GI: Abdomen is soft, nontender, nondistended, no abdominal masses GU: No CVA tenderness.  Skin: No rashes, bruises or suspicious lesions. Lymph: No cervical or inguinal adenopathy. Neurologic: Grossly intact, no focal deficits, moving all 4 extremities. Psychiatric: Normal mood and affect.  Laboratory Data: RecentLabs       Lab Results  Component Value Date   WBC 7.2 12/29/2015   HGB 10.5 (L)  12/29/2015   HCT 33.2 (L) 12/29/2015   MCV 74.1 (L) 12/29/2015   PLT 286 12/29/2015      RecentLabs       Lab Results  Component Value Date   CREATININE 0.90 12/29/2015      RecentLabs  No results found for: PSA    RecentLabs  No results found for: TESTOSTERONE    RecentLabs  No results found for: HGBA1C    Urinalysis Labs(Brief)         Component Value Date/Time   COLORURINE YELLOW (A) 01/18/2016 1106   APPEARANCEUR Clear 01/23/2016 0933   LABSPEC 1.016 01/18/2016 1106   LABSPEC 1.015 08/08/2011 1106   PHURINE 6.0 01/18/2016 1106   GLUCOSEU Negative 01/23/2016 0933   GLUCOSEU Negative 08/08/2011 1106   HGBUR 1+ (A) 01/18/2016 1106   BILIRUBINUR Negative 01/23/2016 0933   BILIRUBINUR Negative 08/08/2011 1106   KETONESUR NEGATIVE 01/18/2016 1106   PROTEINUR Negative 01/23/2016 0933   PROTEINUR NEGATIVE 01/18/2016 1106   NITRITE Negative 01/23/2016 0933   NITRITE NEGATIVE 01/18/2016 1106   LEUKOCYTESUR Trace (A) 01/23/2016 0933   LEUKOCYTESUR Negative 08/08/2011 1106      Assessment &Plan:   1. Right UPJ obstruction -Right stent exchange today

## 2017-01-25 NOTE — Transfer of Care (Signed)
Immediate Anesthesia Transfer of Care Note  Patient: Lori Blanchard  Procedure(s) Performed: Procedure(s): CYSTOSCOPY WITH STENT REPLACEMENT (Right)  Patient Location: PACU  Anesthesia Type:General  Level of Consciousness: awake, alert  and oriented  Airway & Oxygen Therapy: Patient Spontanous Breathing and Patient connected to face mask  Post-op Assessment: Report given to RN, Post -op Vital signs reviewed and stable and Patient moving all extremities  Post vital signs: Reviewed and stable  Last Vitals:  Vitals:   01/25/17 0723 01/25/17 0915  BP: (!) 182/76 127/61  Pulse:  (!) 131  Resp:  16  Temp:  36.7 C    Last Pain:  Vitals:   01/25/17 0719  TempSrc: Tympanic         Complications: No apparent anesthesia complications

## 2017-01-25 NOTE — Op Note (Signed)
Date of procedure: 01/25/17  Preoperative diagnosis:  1. Right UPJ obstruction   Postoperative diagnosis:  1. Right UPJ obstruction   Procedure: 1. Cystoscopy 2. Right retrograde pyelogram with interpretation 3. Right ureteral stent exchange 6 [place] by 24 cm  Surgeon: Baruch Gouty, MD  Anesthesia: General  Complications: None  Intraoperative findings: The patient had her previously known right UPJ obstruction was confirmed with a right retrograde pyelogram used to identify the collecting system. She underwent a successful stent exchange.  EBL: None  Specimens: None  Drains: Right 6 French by 24 cm double-J ureteral stent  Disposition: Stable to the postanesthesia care unit  Indication for procedure: The patient is a 71 y.o. female with a right UPJ obstruction that she is symptomatic from. She is a poor surgical candidate has been temporized with a right ureteral stent. She does have issues with calcifying her son apparently fast basis resulting in right flank pain. She presents today for her two-month stent exchanges she cannot tolerate a longer interval between exchanges.  After reviewing the management options for treatment, the patient elected to proceed with the above surgical procedure(s). We have discussed the potential benefits and risks of the procedure, side effects of the proposed treatment, the likelihood of the patient achieving the goals of the procedure, and any potential problems that might occur during the procedure or recuperation. Informed consent has been obtained.  Description of procedure: The patient was met in the preoperative area. All risks, benefits, and indications of the procedure were described in great detail. The patient consented to the procedure. Preoperative antibiotics were given. The patient was taken to the operative theater. General anesthesia was induced per the anesthesia service. The patient was then placed in the dorsal lithotomy position  and prepped and draped in the usual sterile fashion. A preoperative timeout was called.   A 21 French 30 cystoscope was inserted into the patient's bladder per urethra atraumatically. The right ureteral stent was grasped flexible graspers and brought to level the urethral meatus. A sensor wire was exchanged through the right ureteral stent to level of the renal pelvis and the stent was removed. With the aid of a dual-lumen catheter, right retrograde pyelogram was obtained which again was used to identify the collecting system and showed her UPJ obstruction. Over the sensor wire with the aid of the cystoscope was 6 [place] by 24 cm double-J ureteral stent was then placed. The sensor wire was removed. A curl seen in the patient's right renal pelvis on fluoroscopy and the urinary bladder under direct visualization. At this point the patient's bladder was drained, and she was woken from anesthesia and transferred stable to the postanesthesia care unit.  Plan: The patient will follow-up in 2 months for repeat stent exchange.  Baruch Gouty, M.D.

## 2017-01-25 NOTE — Anesthesia Post-op Follow-up Note (Cosign Needed)
Anesthesia QCDR form completed.        

## 2017-01-26 ENCOUNTER — Encounter: Payer: Self-pay | Admitting: Urology

## 2017-02-27 ENCOUNTER — Telehealth: Payer: Self-pay | Admitting: Radiology

## 2017-02-27 ENCOUNTER — Other Ambulatory Visit: Payer: Self-pay | Admitting: Radiology

## 2017-02-27 DIAGNOSIS — N135 Crossing vessel and stricture of ureter without hydronephrosis: Secondary | ICD-10-CM

## 2017-02-27 NOTE — Telephone Encounter (Signed)
Pt scheduled for right ureteral stent exchange with Dr Pilar Jarvis on 03/27/17. Made pt aware of surgery date, pre-admit testing appt, appt for urine culture & to call day prior to surgery for arrival time to SDS. Advised pt to continue ASA 81mg  prior to surgery per Dr Pilar Jarvis. Questions answered. Pt voices understanding.

## 2017-02-27 NOTE — Telephone Encounter (Signed)
-----   Message from [name], MD sent at 01/25/2017  9:12 AM EDT ----- Patient needs cysto, right ureteral stent exchange in two months. thanks

## 2017-03-13 ENCOUNTER — Other Ambulatory Visit: Payer: Medicare Other

## 2017-03-15 ENCOUNTER — Ambulatory Visit (INDEPENDENT_AMBULATORY_CARE_PROVIDER_SITE_OTHER): Payer: Medicare Other | Admitting: *Deleted

## 2017-03-15 ENCOUNTER — Encounter: Payer: Self-pay | Admitting: *Deleted

## 2017-03-15 ENCOUNTER — Encounter
Admission: RE | Admit: 2017-03-15 | Discharge: 2017-03-15 | Disposition: A | Discharge status: Home / Self Care | Payer: Medicare Other | Source: Ambulatory Visit | Attending: Urology | Admitting: Urology

## 2017-03-15 VITALS — BP 136/69 | HR 98 | Ht <= 58 in | Wt 162.5 lb

## 2017-03-15 DIAGNOSIS — Z01818 Encounter for other preprocedural examination: Secondary | ICD-10-CM | POA: Diagnosis not present

## 2017-03-15 DIAGNOSIS — Z01812 Encounter for preprocedural laboratory examination: Secondary | ICD-10-CM | POA: Insufficient documentation

## 2017-03-15 HISTORY — DX: Malignant (primary) neoplasm, unspecified: C80.1

## 2017-03-15 HISTORY — DX: Dyspnea, unspecified: R06.00

## 2017-03-15 HISTORY — DX: Anxiety disorder, unspecified: F41.9

## 2017-03-15 LAB — BASIC METABOLIC PANEL
Anion gap: 11 (ref 5–15)
BUN: 18 mg/dL (ref 6–20)
CALCIUM: 9.1 mg/dL (ref 8.9–10.3)
CO2: 26 mmol/L (ref 22–32)
CREATININE: 0.89 mg/dL (ref 0.44–1.00)
Chloride: 105 mmol/L (ref 101–111)
GFR calc Af Amer: >60 mL/min (ref >60)
GLUCOSE: 190 mg/dL — AB (ref 65–99)
Potassium: 4.1 mmol/L (ref 3.5–5.1)
SODIUM: 142 mmol/L (ref 135–145)

## 2017-03-15 NOTE — Pre-Procedure Instructions (Signed)
Notified patient to continue Aspirin 81 mg per telephone encounter by Sherlon Handing on February 27, 2017. Patient verbalizes understanding.

## 2017-03-15 NOTE — Progress Notes (Signed)
In and Out Catheterization  Patient is present today for a I & O catheterization due to needing a culture prior to surgery per Dr. Pilar Jarvis. Patient was cleaned and prepped in a sterile fashion with betadine and Lidocaine 2% jelly was instilled into the urethra.  A 14FR cath was inserted no complications were noted, 44ml of urine return was noted, urine was yellow in color. A clean urine sample was collected for culture. Bladder was drained and catheter was removed with out difficulty.    Preformed by: Lyndee Hensen CMA

## 2017-03-15 NOTE — Patient Instructions (Signed)
Your procedure is scheduled on: March 27, 2017 [facility] ) Report to Same Day Surgery 2nd floor medical mall [facility] Entrance-take elevator on left to 2nd floor.  Check in with surgery information desk.) To find out your arrival time please call [phone] between 1PM - 3PM on March 26, 2017 (TUESDAY)   Remember: Instructions that are not followed completely may result in serious medical risk, up to and including death, or upon the discretion of your surgeon and anesthesiologist your surgery may need to be rescheduled.    _x___ 1. Do not eat food or drink liquids after midnight. No gum chewing or hard candies                              __x__ 2. No Alcohol for 24 hours before or after surgery.   __x__3. No Smoking for 24 prior to surgery.   ____  4. Bring all medications with you on the day of surgery if instructed.    __x__ 5. Notify your doctor if there is any change in your medical condition     (cold, fever, infections).     Do not wear jewelry, make-up, hairpins, clips or nail polish.  Do not wear lotions, powders, or perfumes. You may wear deodorant.  Do not shave 48 hours prior to surgery. Men may shave face and neck.  Do not bring valuables to the hospital.    [facility] is not responsible for any belongings or valuables.               Contacts, dentures or bridgework may not be worn into surgery.  Leave your suitcase in the car. After surgery it may be brought to your room.  For patients admitted to the hospital, discharge time is determined by your treatment team                    Patients discharged the day of surgery will not be allowed to drive home.  You will need someone to drive you home and stay with you the night of your procedure.    Please read over the following fact sheets that you were given:   [facility] Preparing for Surgery and or MRSA Information   TAKE THE FOLLOWING MEDICATIONS THE MORNING OF SURGERY WITH A SIP OF WATER   1. PANTOPRAZOLE  (PANTOPRAZOLE AT BEDTIME ON AUGUST 21, TUESDAY    NIGHT )  2. LEVOTHYROXINE  3. WELLBUTRIN  4.  5.  6.  ____Fleets enema or Magnesium Citrate as directed.   ____ Use CHG Soap or sage wipes as directed on instruction sheet   __X__ Use inhalers on the day of surgery and bring to hospital day of surgery (USE ALBUTEROL NEBULIZER THE MORNING OF SURGERY AND BRING ALBUTEROL [place] )  _X___ Stop Metformin  2 days prior to surgery. (STOP METFORMIN ON AUGUST 20 )    _x__ Take 1/2 of usual insulin dose the night before surgery and none on the morning surgery  (TAKE ONE HALF OF LANTUS INSULIN AT BEDTIME ON AUGUST 21, AND NO INSULIN THE MORNING OF SURGERY )  _x___ Follow recommendations from Cardiologist, Pulmonologist or PCP regarding          stopping Aspirin, Coumadin, Pllavix ,Eliquis, Effient, or Pradaxa, and Pletal. (STOP ASPIRIN ONE WEEK PRIOR TO SURGERY )  X____Stop Anti-inflammatories such as Advil, Aleve, Ibuprofen, Motrin, Naproxen, Naprosyn, Goodies powders or aspirin  products. OK to take Tylenol    _x___ Stop supplements until after surgery.  But may continue Vitamin D, Vitamin B and multivitamin       _x___ Bring C-Pap to the hospital.

## 2017-03-18 ENCOUNTER — Other Ambulatory Visit: Payer: Medicare Other

## 2017-03-18 LAB — CULTURE, URINE COMPREHENSIVE

## 2017-03-26 MED ORDER — CEFAZOLIN SODIUM-DEXTROSE 2-4 GM/100ML-% IV SOLN
2.0000 g | INTRAVENOUS | Status: AC
  Administered 2017-03-27: 2 g via INTRAVENOUS

## 2017-03-27 ENCOUNTER — Ambulatory Visit: Payer: Medicare Other | Admitting: Anesthesiology

## 2017-03-27 ENCOUNTER — Encounter
Admission: RE | Disposition: A | Discharge status: Home / Self Care | Payer: Self-pay | Source: Ambulatory Visit | Attending: Urology

## 2017-03-27 ENCOUNTER — Encounter: Payer: Self-pay | Admitting: *Deleted

## 2017-03-27 ENCOUNTER — Ambulatory Visit
Admission: RE | Admit: 2017-03-27 | Discharge: 2017-03-27 | Disposition: A | Discharge status: Home / Self Care | Payer: Medicare Other | Source: Ambulatory Visit | Attending: Urology | Admitting: Urology

## 2017-03-27 DIAGNOSIS — F419 Anxiety disorder, unspecified: Secondary | ICD-10-CM | POA: Insufficient documentation

## 2017-03-27 DIAGNOSIS — Z888 Allergy status to other drugs, medicaments and biological substances status: Secondary | ICD-10-CM | POA: Insufficient documentation

## 2017-03-27 DIAGNOSIS — N130 Hydronephrosis with ureteropelvic junction obstruction: Secondary | ICD-10-CM | POA: Diagnosis not present

## 2017-03-27 DIAGNOSIS — I10 Essential (primary) hypertension: Secondary | ICD-10-CM | POA: Diagnosis not present

## 2017-03-27 DIAGNOSIS — N135 Crossing vessel and stricture of ureter without hydronephrosis: Secondary | ICD-10-CM | POA: Diagnosis not present

## 2017-03-27 DIAGNOSIS — F329 Major depressive disorder, single episode, unspecified: Secondary | ICD-10-CM | POA: Insufficient documentation

## 2017-03-27 DIAGNOSIS — G309 Alzheimer's disease, unspecified: Secondary | ICD-10-CM | POA: Insufficient documentation

## 2017-03-27 DIAGNOSIS — M109 Gout, unspecified: Secondary | ICD-10-CM | POA: Insufficient documentation

## 2017-03-27 DIAGNOSIS — F0280 Dementia in other diseases classified elsewhere without behavioral disturbance: Secondary | ICD-10-CM | POA: Diagnosis not present

## 2017-03-27 DIAGNOSIS — G4730 Sleep apnea, unspecified: Secondary | ICD-10-CM | POA: Insufficient documentation

## 2017-03-27 DIAGNOSIS — E039 Hypothyroidism, unspecified: Secondary | ICD-10-CM | POA: Diagnosis not present

## 2017-03-27 DIAGNOSIS — N1330 Unspecified hydronephrosis: Secondary | ICD-10-CM | POA: Insufficient documentation

## 2017-03-27 DIAGNOSIS — E119 Type 2 diabetes mellitus without complications: Secondary | ICD-10-CM | POA: Insufficient documentation

## 2017-03-27 DIAGNOSIS — M1990 Unspecified osteoarthritis, unspecified site: Secondary | ICD-10-CM | POA: Diagnosis not present

## 2017-03-27 HISTORY — PX: CYSTOSCOPY W/ URETERAL STENT PLACEMENT: SHX1429

## 2017-03-27 HISTORY — PX: CYSTOSCOPY W/ RETROGRADES: SHX1426

## 2017-03-27 LAB — GLUCOSE, CAPILLARY
Glucose-Capillary: 128 mg/dL — ABNORMAL HIGH (ref 65–99)
Glucose-Capillary: 128 mg/dL — ABNORMAL HIGH (ref 65–99)

## 2017-03-27 SURGERY — CYSTOSCOPY, FLEXIBLE, WITH STENT REPLACEMENT
Anesthesia: General | Site: Ureter | Laterality: Right | Wound class: Clean Contaminated

## 2017-03-27 MED ORDER — PROPOFOL 10 MG/ML IV BOLUS
INTRAVENOUS | Status: DC | PRN
  Administered 2017-03-27: 120 mg via INTRAVENOUS

## 2017-03-27 MED ORDER — FENTANYL CITRATE (PF) 100 MCG/2ML IJ SOLN: 25.0000 ug | INTRAMUSCULAR | Status: DC | PRN

## 2017-03-27 MED ORDER — LACTATED RINGERS IV SOLN: INTRAVENOUS | Status: DC | PRN

## 2017-03-27 MED ORDER — GLYCOPYRROLATE 0.2 MG/ML IJ SOLN
INTRAMUSCULAR | Status: AC
  Filled 2017-03-27: qty 1

## 2017-03-27 MED ORDER — ONDANSETRON HCL 4 MG/2ML IJ SOLN: 4.0000 mg | Freq: Once | INTRAMUSCULAR | Status: DC | PRN

## 2017-03-27 MED ORDER — METOPROLOL TARTRATE 5 MG/5ML IV SOLN
INTRAVENOUS | Status: DC | PRN
  Administered 2017-03-27: 2 mg via INTRAVENOUS

## 2017-03-27 MED ORDER — SODIUM CHLORIDE 0.9 % IV SOLN
INTRAVENOUS | Status: DC
  Administered 2017-03-27 (×2): via INTRAVENOUS

## 2017-03-27 MED ORDER — LIDOCAINE 2% (20 MG/ML) 5 ML SYRINGE
INTRAMUSCULAR | Status: DC | PRN
  Administered 2017-03-27: 100 mg via INTRAVENOUS

## 2017-03-27 MED ORDER — LIDOCAINE HCL (PF) 2 % IJ SOLN
INTRAMUSCULAR | Status: AC
  Filled 2017-03-27: qty 2

## 2017-03-27 MED ORDER — HYDROCODONE-ACETAMINOPHEN 5-325 MG PO TABS: 1.0000 | ORAL_TABLET | ORAL | 0 refills | Status: DC | PRN

## 2017-03-27 MED ORDER — FENTANYL CITRATE (PF) 100 MCG/2ML IJ SOLN
INTRAMUSCULAR | Status: DC | PRN
  Administered 2017-03-27: 50 ug via INTRAVENOUS

## 2017-03-27 MED ORDER — ONDANSETRON HCL 4 MG/2ML IJ SOLN
INTRAMUSCULAR | Status: AC
  Filled 2017-03-27: qty 2

## 2017-03-27 MED ORDER — DEXAMETHASONE SODIUM PHOSPHATE 10 MG/ML IJ SOLN
INTRAMUSCULAR | Status: AC
  Filled 2017-03-27: qty 1

## 2017-03-27 MED ORDER — FENTANYL CITRATE (PF) 100 MCG/2ML IJ SOLN
INTRAMUSCULAR | Status: AC
  Filled 2017-03-27: qty 2

## 2017-03-27 MED ORDER — PROPOFOL 500 MG/50ML IV EMUL
INTRAVENOUS | Status: AC
  Filled 2017-03-27: qty 50

## 2017-03-27 MED ORDER — CEFAZOLIN SODIUM-DEXTROSE 2-4 GM/100ML-% IV SOLN
INTRAVENOUS | Status: AC
  Filled 2017-03-27: qty 100

## 2017-03-27 MED ORDER — CEPHALEXIN 500 MG PO CAPS: 500.0000 mg | ORAL_CAPSULE | Freq: Three times a day (TID) | ORAL | 0 refills | Status: DC

## 2017-03-27 SURGICAL SUPPLY — 19 items
BACTOSHIELD CHG 4% 4OZ (MISCELLANEOUS) ×2
GLOVE BIO SURGEON STRL SZ7.5 (GLOVE) ×3 IMPLANT
GOWN STRL REUS W/ TWL LRG LVL4 (GOWN DISPOSABLE) ×1 IMPLANT
GOWN STRL REUS W/TWL LRG LVL4 (GOWN DISPOSABLE) ×3
GOWN STRL REUS W/TWL XL LVL3 (GOWN DISPOSABLE) ×3 IMPLANT
INTRODUCER DILATOR DOUBLE (INTRODUCER) ×3 IMPLANT
KIT RM TURNOVER CYSTO AR (KITS) ×3 IMPLANT
PACK CYSTO AR (MISCELLANEOUS) ×3 IMPLANT
SCRUB CHG 4% DYNA-HEX 4OZ (MISCELLANEOUS) ×1 IMPLANT
SENSORWIRE 0.038 NOT ANGLED (WIRE) ×3
SET CYSTO W/LG BORE CLAMP LF (SET/KITS/TRAYS/PACK) ×3 IMPLANT
SOL .9 NS 3000ML IRR  AL (IV SOLUTION) ×2
SOL .9 NS 3000ML IRR AL (IV SOLUTION) ×1
SOL .9 NS 3000ML IRR UROMATIC (IV SOLUTION) ×1 IMPLANT
STENT URET 6FRX24 CONTOUR (STENTS) ×3 IMPLANT
STENT URET 6FRX26 CONTOUR (STENTS) IMPLANT
SURGILUBE 2OZ TUBE FLIPTOP (MISCELLANEOUS) ×3 IMPLANT
WATER STERILE IRR 1000ML POUR (IV SOLUTION) ×3 IMPLANT
WIRE SENSOR 0.038 NOT ANGLED (WIRE) ×1 IMPLANT

## 2017-03-27 NOTE — Anesthesia Preprocedure Evaluation (Signed)
Anesthesia Evaluation  Patient identified by MRN, date of birth, ID band Patient awake    Reviewed: Allergy & Precautions, H&P , NPO status , Patient's Chart, lab work & pertinent test results  History of Anesthesia Complications Negative for: history of anesthetic complications  Airway Mallampati: III  TM Distance: <3 FB Neck ROM: limited    Dental  (+) Poor Dentition, Chipped, Missing   Pulmonary shortness of breath and with exertion, asthma , sleep apnea and Continuous Positive Airway Pressure Ventilation , COPD, neg recent URI,           Cardiovascular Exercise Tolerance: Good hypertension, (-) angina(-) Past MI      Neuro/Psych PSYCHIATRIC DISORDERS Anxiety Depression negative neurological ROS     GI/Hepatic Neg liver ROS, GERD  ,  Endo/Other  negative endocrine ROSdiabetes, Well Controlled, Type 2Hypothyroidism   Renal/GU Renal disease     Musculoskeletal  (+) Arthritis ,   Abdominal   Peds  Hematology negative hematology ROS (+)   Anesthesia Other Findings Past Medical History: No date: Alzheimer disease No date: Anxiety and depression No date: Asthma No date: COPD (chronic obstructive pulmonary disease) (* No date: Depression No date: Diabetes (HCC) No date: Family history of adverse reaction to anesthes*     Comment: daughter has breathing problems after               anesthesia No date: Gout No date: History of kidney stones No date: Hydronephrosis No date: Hyperlipemia No date: Hypertension No date: Hypothyroidism No date: Osteoarthritis No date: Osteoarthritis No date: Pulmonary nodule 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 No date: Sleep apnea  Past Surgical History: No date: ABDOMINAL HYSTERECTOMY     Comment: partial 1965: APPENDECTOMY 1990: BREAST SURGERY Bilateral     Comment: Reduction No date: CARPAL TUNNEL RELEASE No date: CESAREAN SECTION     Comment: x 3 11/02/2015: CYSTOSCOPY  W/ RETROGRADES Right     Comment: Procedure: CYSTOSCOPY WITH RETROGRADE               PYELOGRAM, URETERAL STENT EXCHANGE;  Surgeon:               [name], MD;  Location: [facility];                Service: Urology;  Laterality: Right; 01/31/2016: CYSTOSCOPY W/ RETROGRADES Right     Comment: Procedure: CYSTOSCOPY WITH RETROGRADE               PYELOGRAM;  Surgeon: [name], MD;                Location: [facility];  Service: Urology;                Laterality: Right; 05/09/2016: CYSTOSCOPY W/ RETROGRADES Bilateral     Comment: Procedure: CYSTOSCOPY WITH RETROGRADE               PYELOGRAM;  Surgeon: [name], MD;                Location: [facility];  Service: Urology;                Laterality: Bilateral; 05/11/2015: CYSTOSCOPY W/ URETERAL STENT PLACEMENT Right     Comment: Procedure: CYSTOSCOPY WITH STENT REPLACEMENT;               Surgeon: [name], MD;  Location:               [facility];  Service: Urology;  Laterality:               Right; 01/31/2016: CYSTOSCOPY W/ URETERAL STENT PLACEMENT Right     Comment: Procedure: CYSTOSCOPY, RETROGRADE PYELOGRAMS               WITH STENT REPLACEMENT;  Surgeon: [name], MD;  Location: [facility];  Service:               Urology;  Laterality: Right; 05/09/2016: CYSTOSCOPY W/ URETERAL STENT PLACEMENT Right     Comment: Procedure: CYSTOSCOPY WITH STENT REPLACEMENT;               Surgeon: [name], MD;  Location:               [facility];  Service: Urology;  Laterality:               Right; 07/04/2016: CYSTOSCOPY W/ URETERAL STENT PLACEMENT Bilateral     Comment: Procedure: CYSTOSCOPY WITH STENT REPLACEMENT;               Surgeon: [name], MD;  Location:               [facility];  Service: Urology;  Laterality:               Bilateral; 05/09/2016: CYSTOSCOPY WITH STENT PLACEMENT Left     Comment: Procedure: CYSTOSCOPY WITH STENT PLACEMENT;                Surgeon: [name], MD;  Location:               [facility];  Service: Urology;  Laterality: Left; No date: HERNIA REPAIR     Comment: umbilical [id number]: JOINT REPLACEMENT Left     Comment: TKR 2011: JOINT REPLACEMENT Right     Comment: TKR No date: LAPAROSCOPIC HYSTERECTOMY No date: REDUCTION MAMMAPLASTY No date: REPLACEMENT TOTAL KNEE Bilateral 05/09/2016: URETEROSCOPY Bilateral     Comment: Procedure: URETEROSCOPY;  Surgeon: [name], MD;  Location: [facility];  Service:               Urology;  Laterality: Bilateral; 07/04/2016: URETEROSCOPY WITH HOLMIUM LASER LITHOTRIPSY Left     Comment: Procedure: URETEROSCOPY WITH HOLMIUM LASER               LITHOTRIPSY;  Surgeon: [name], MD;               Location: [facility];  Service: Urology;                Laterality: Left;  BMI    Body Mass Index:  34.19 kg/m      Reproductive/Obstetrics negative OB ROS                             Anesthesia Physical  Anesthesia Plan  ASA: III  Anesthesia Plan: General LMA   Post-op Pain Management:    Induction: Intravenous  PONV Risk Score and Plan: 3 and Ondansetron, Dexamethasone and Treatment may vary due to age or medical condition  Airway Management Planned: LMA  Additional Equipment:   Intra-op Plan:   Post-operative Plan:   Informed Consent: I have reviewed the patients History and Physical, chart, labs and discussed the procedure including  the risks, benefits and alternatives for the proposed anesthesia with the patient or authorized representative who has indicated his/her understanding and acceptance.   Dental Advisory Given  Plan Discussed with: Anesthesiologist, CRNA and Surgeon  Anesthesia Plan Comments:         Anesthesia Quick Evaluation

## 2017-03-27 NOTE — Anesthesia Postprocedure Evaluation (Signed)
Anesthesia Post Note  Patient: Lori Blanchard  Procedure(s) Performed: Procedure(s) (LRB): CYSTOSCOPY WITH STENT REPLACEMENT (Right) CYSTOSCOPY WITH RETROGRADE PYELOGRAM (Right)  Patient location during evaluation: PACU Anesthesia Type: General Level of consciousness: awake and alert and oriented Pain management: pain level controlled Vital Signs Assessment: post-procedure vital signs reviewed and stable Respiratory status: spontaneous breathing Cardiovascular status: blood pressure returned to baseline Anesthetic complications: no     Last Vitals:  Vitals:   03/27/17 1244 03/27/17 1256  BP: 123/82 120/80  Pulse: 80 82  Resp: 18 17  Temp: 36.6 C   SpO2: 99% 95%    Last Pain:  Vitals:   03/27/17 1159  TempSrc:   PainSc: Asleep                 Samer Dutton

## 2017-03-27 NOTE — Transfer of Care (Signed)
Immediate Anesthesia Transfer of Care Note  Patient: Lori Blanchard  Procedure(s) Performed: Procedure(s): CYSTOSCOPY WITH STENT REPLACEMENT (Right) CYSTOSCOPY WITH RETROGRADE PYELOGRAM (Right)  Patient Location: PACU  Anesthesia Type:General  Level of Consciousness: awake, alert  and oriented  Airway & Oxygen Therapy: Patient Spontanous Breathing and Patient connected to face mask oxygen  Post-op Assessment: Report given to RN and Post -op Vital signs reviewed and stable  Post vital signs: Reviewed and stable  Last Vitals:  Vitals:   03/27/17 0957 03/27/17 1159  BP: (!) 171/83 (!) 147/64  Pulse: 91 (!) 103  Resp: (!) 22 (!) 23  Temp: (!) 36.4 C 37.4 C  SpO2: 96% 99%    Last Pain:  Vitals:   03/27/17 0957  TempSrc: Tympanic         Complications: No apparent anesthesia complications

## 2017-03-27 NOTE — Anesthesia Procedure Notes (Signed)
Procedure Name: LMA Insertion Date/Time: 03/27/2017 11:45 AM Performed by: Marsh Dolly Pre-anesthesia Checklist: Patient identified, Patient being monitored, Timeout performed, Emergency Drugs available and Suction available Patient Re-evaluated:Patient Re-evaluated prior to induction Oxygen Delivery Method: Circle system utilized Preoxygenation: Pre-oxygenation with 100% oxygen Induction Type: IV induction Ventilation: Mask ventilation without difficulty LMA: LMA inserted LMA Size: 4.0 Tube type: Oral Number of attempts: 1 Placement Confirmation: positive ETCO2 and breath sounds checked- equal and bilateral Tube secured with: Tape Dental Injury: Teeth and Oropharynx as per pre-operative assessment

## 2017-03-27 NOTE — Anesthesia Post-op Follow-up Note (Signed)
Anesthesia QCDR form completed.        

## 2017-03-27 NOTE — H&P (Signed)
Lori Blanchard [birth year] [id number]  Referring provider: [name], MD [facility] [place], [place] [address]      Chief Complaint  Patient presents with  . Follow-up    Ureteropelvic junction (UPJ) obstruction    HPI: The patient is a 71 year old female with a chronic right symptomatic UPJ obstruction that is treated with an indwelling ureteral stent exchanged every 2 months 2/2 calcification due to her multiple medical co-morbidities. She presents today for right ureteral stent exchange.   She had a 6 mm nonobstructing left upper pole stone that removed approximately 8 months ago. It was 77% calcium oxalate monohydrate, 3% calcium phosphate, and 20% uric acid.    PMH:     Past Medical History:  Diagnosis Date  . Alzheimer disease   . Anxiety and depression   . Asthma   . Diabetes ([place])   . Family history of adverse reaction to anesthesia    daughter has breathing problems after anesthesia  . Gout   . Hydronephrosis   . Hyperlipemia   . Hypertension   . Hypothyroidism   . Osteoarthritis   . Osteoarthritis   . Pulmonary nodule   . Retroperitoneal fibrosis   . Sleep apnea     Surgical History:      Past Surgical History:  Procedure Laterality Date  . ABDOMINAL HYSTERECTOMY     partial  . BREAST SURGERY Bilateral 1990   Reduction  . CARPAL TUNNEL RELEASE    . CESAREAN SECTION     x 3  . CYSTOSCOPY W/ RETROGRADES Right 11/02/2015   Procedure: CYSTOSCOPY WITH RETROGRADE PYELOGRAM, URETERAL STENT EXCHANGE; Surgeon: [name], MD; Location: [facility]; Service: Urology; Laterality: Right;  . CYSTOSCOPY W/ RETROGRADES Right 01/31/2016   Procedure: CYSTOSCOPY WITH RETROGRADE PYELOGRAM; Surgeon: [name], MD; Location: [facility]; Service: Urology; Laterality: Right;  . CYSTOSCOPY W/ URETERAL STENT PLACEMENT Right 05/11/2015   Procedure: CYSTOSCOPY WITH STENT  REPLACEMENT; Surgeon: [name], MD; Location: [facility]; Service: Urology; Laterality: Right;  . CYSTOSCOPY W/ URETERAL STENT PLACEMENT Right 01/31/2016   Procedure: CYSTOSCOPY, RETROGRADE PYELOGRAMS WITH STENT REPLACEMENT; Surgeon: [name], MD; Location: [facility]; Service: Urology; Laterality: Right;  . HERNIA REPAIR     umbilical  . JOINT REPLACEMENT Left 2013   TKR  . JOINT REPLACEMENT Right 2011   TKR  . LAPAROSCOPIC HYSTERECTOMY    . REPLACEMENT TOTAL KNEE Bilateral     Home Medications:        Medication List           Accurate as of 02/29/16 10:15 AM.Always use your most recent med list.           albuterol(2.5 MG/3ML) 0.083% nebulizer solution Commonly known as: PROVENTIL Inhale 2.5 mg into the lungs every 4 (four) hours as needed for wheezing or shortness of breath.  PROAIR HFA108 (90 Base) MCG/ACT inhaler Generic drug: albuterol USE 2 PUFFS EVERY FOUR HOURS AS NEEDED.  alendronate70 MG tablet Commonly known as: FOSAMAX TAKE ONE TABLET EVERY WEEK. TAKE WITH A FUUL GLASS OF WATER AND DO NOT LIE DOWN FOR THE NEXT 30 MINUTES. usually on Friday or Saturday.  aspirin EC81 MG tablet Take 81 mg by mouth every morning.  atorvastatin40 MG tablet Commonly known as: LIPITOR Take 40 mg by mouth every morning.  buPROPion150 MG 24 hr tablet Commonly known as: WELLBUTRIN XL Take 150 mg by mouth every morning.  cyclobenzaprine5 MG tablet Commonly known as:  FLEXERIL Take 1 tablet (5 mg total) by mouth every 8 (eight) hours as needed for muscle spasms.  donepezil5 MG tablet Commonly known as: ARICEPT Take 5 mg by mouth daily.  fenofibrate145 MG tablet Commonly known as: TRICOR TAKE ONE (1) TABLET EACH DAY in am  furosemide20 MG tablet Commonly known as: LASIX Take 20 mg by mouth every morning.  HYDROcodone-acetaminophen5-325 MG tablet Commonly known as: NORCO Take 1 tablet by mouth every 6 (six)  hours as needed for moderate pain.  insulin lispro100 UNIT/ML KiwkPen Commonly known as: HUMALOG Inject 10 Units into the skin 3 (three) times daily.  LANTUS SOLOSTAR100 UNIT/ML Solostar Pen Generic drug: Insulin Glargine Inject 20 Units into the skin daily at 10 pm.  levothyroxine50 MCG tablet Commonly known as: SYNTHROID, LEVOTHROID TAKE 1 TABLET ON AN EMPTY STOMACH AT LEAST 30-60 MINUTES BEFORE BREAKFAST  metFORMIN1000 MG tablet Commonly known as: GLUCOPHAGE twice a day  pantoprazole40 MG tablet Commonly known as: PROTONIX Take 40 mg by mouth every morning.  PEN NEEDLES 31GX5/16"31G X 8 MM Misc Inject into the skin.  SURE COMFORT PEN NEEDLES31G X 5 MM Misc Generic drug: Insulin Pen Needle USE 4 TIMES A DAY OR AS DIRECTED BY DOCTOR  RA VITAMIN B-12 TR1000 MCG Tbcr Generic drug: Cyanocobalamin Take 1 tablet by mouth daily.  senna8.6 MG tablet Commonly known as: SENOKOT Take 1 tablet by mouth 2 (two) times daily. At 8 am and 5 pm  telmisartan40 MG tablet Commonly known as: MICARDIS TAKE ONE (1) TABLET EACH DAY in am  vitamin E1000 UNIT capsule Take 1,000 Units by mouth daily.  vitamin E400 UNIT capsule Take 400 Units by mouth every morning.      Allergies:      Allergies  Allergen Reactions  . Ibuprofen Itching, Nausea Only and Other (See Comments)    Family History:       Family History  Problem Relation Age of Onset  . Liver cancer Mother   . Colon cancer Mother   . Breast cancer Mother   . Diabetes Daughter   . Kidney disease Daughter     adrenal tumors  . Kidney cancer Neg Hx   . Prostate cancer Neg Hx     Social History:reports that she has never smoked. She has never used smokeless tobacco. She reports that she does not drink alcohol or use drugs.  ROS: UROLOGY Frequent Urination?: No Hard to postpone urination?: No Burning/pain with urination?: No Get up at night to urinate?: Yes Leakage of  urine?: No Urine stream starts and stops?: No Trouble starting stream?: No Do you have to strain to urinate?: No Blood in urine?: No Urinary tract infection?: No Sexually transmitted disease?: No Injury to kidneys or bladder?: No Painful intercourse?: No Weak stream?: No Currently pregnant?: No Vaginal bleeding?: No Last menstrual period?: No  Gastrointestinal Nausea?: No Vomiting?: No Indigestion/heartburn?: No Diarrhea?: No Constipation?: No  Constitutional Fever: No Night sweats?: No Weight loss?: No Fatigue?: No  Skin Skin rash/lesions?: No Itching?: No  Eyes Blurred vision?: No Double vision?: No  Ears/Nose/Throat Sore throat?: No Sinus problems?: No  Hematologic/Lymphatic Swollen glands?: No Easy bruising?: No  Cardiovascular Leg swelling?: Yes Chest pain?: No  Respiratory Cough?: No Shortness of breath?: Yes  Endocrine Excessive thirst?: No  Musculoskeletal Back pain?: No Joint pain?: Yes  Neurological Headaches?: Yes Dizziness?: No  Psychologic Depression?: Yes Anxiety?: Yes  Physical Exam: BP (!) 149/72 (BP Location: Left Arm, Patient Position: Sitting, Cuff Size: Normal)  Pulse 90  Ht 4\' 8"  (1.422 m)  Wt 160 lb 11.2 oz (72.9 kg)  BMI 36.03 kg/m  Constitutional: Alert and oriented, No acute distress. HEENT: [place] AT, moist mucus membranes. Trachea midline, no masses. Cardiovascular: No clubbing, cyanosis, or edema. RRR. Respiratory:Normal respiratory effort,no increased work of breathing. Lungs clear. GI: Abdomen is soft, nontender, nondistended, no abdominal masses GU: No CVA tenderness.  Skin: No rashes, bruises or suspicious lesions. Lymph: No cervical or inguinal adenopathy. Neurologic: Grossly intact, no focal deficits, moving all 4 extremities. Psychiatric: Normal mood and affect.  Laboratory Data: RecentLabs       Lab Results  Component Value Date   WBC 7.2 12/29/2015   HGB 10.5 (L)  12/29/2015   HCT 33.2 (L) 12/29/2015   MCV 74.1 (L) 12/29/2015   PLT 286 12/29/2015      RecentLabs       Lab Results  Component Value Date   CREATININE 0.90 12/29/2015      RecentLabs  No results found for: PSA    RecentLabs  No results found for: TESTOSTERONE    RecentLabs  No results found for: HGBA1C    Urinalysis Labs(Brief)         Component Value Date/Time   COLORURINE YELLOW (A) 01/18/2016 1106   APPEARANCEUR Clear 01/23/2016 0933   LABSPEC 1.016 01/18/2016 1106   LABSPEC 1.015 08/08/2011 1106   PHURINE 6.0 01/18/2016 1106   GLUCOSEU Negative 01/23/2016 0933   GLUCOSEU Negative 08/08/2011 1106   HGBUR 1+ (A) 01/18/2016 1106   BILIRUBINUR Negative 01/23/2016 0933   BILIRUBINUR Negative 08/08/2011 1106   KETONESUR NEGATIVE 01/18/2016 1106   PROTEINUR Negative 01/23/2016 0933   PROTEINUR NEGATIVE 01/18/2016 1106   NITRITE Negative 01/23/2016 0933   NITRITE NEGATIVE 01/18/2016 1106   LEUKOCYTESUR Trace (A) 01/23/2016 0933   LEUKOCYTESUR Negative 08/08/2011 1106      Assessment &Plan:   1. Right UPJ obstruction -Right stent exchange today

## 2017-03-27 NOTE — Op Note (Signed)
Date of procedure: 03/27/17  Preoperative diagnosis:  1. Right UPJ obstruction   Postoperative diagnosis:  1. Right UPJ obstruction   Procedure: 1. Cystoscopy 2. Right retrograde pyelogram with interpretation 3. Right ureteral stent exchange 6 [place] by 24 cm  Surgeon: Baruch Gouty, MD  Anesthesia: General  Complications: None  Intraoperative findings: The patient had her previously known right UPJ obstruction was confirmed with a right retrograde pyelogram used to identify the collecting system. She underwent a successful stent exchange.  EBL: None  Specimens: None  Drains: Right 6 French by 24 cm double-J ureteral stent  Disposition: Stable to the postanesthesia care unit  Indication for procedure: The patient is a 71 y.o. female with a right UPJ obstruction that she is symptomatic from. She is a poor surgical candidate and has been temporized with a right ureteral stent. She does have issues with calcifying her stent unusually fast resulting in right flank pain. She presents today for her two-month stent exchange as she cannot tolerate a longer interval between exchanges.  After reviewing the management options for treatment, the patient elected to proceed with the above surgical procedure(s). We have discussed the potential benefits and risks of the procedure, side effects of the proposed treatment, the likelihood of the patient achieving the goals of the procedure, and any potential problems that might occur during the procedure or recuperation. Informed consent has been obtained.  Description of procedure: The patient was met in the preoperative area. All risks, benefits, and indications of the procedure were described in great detail. The patient consented to the procedure. Preoperative antibiotics were given. The patient was taken to the operative theater. General anesthesia was induced per the anesthesia service. The patient was then placed in the dorsal  lithotomy position and prepped and draped in the usual sterile fashion. A preoperative timeout was called.   A 21 French 30 cystoscope was inserted into the patient's bladder per urethra atraumatically. The right ureteral stent was grasped flexible graspers and brought to level the urethral meatus. A sensor wire was exchanged through the right ureteral stent to level of the renal pelvis and the stent was removed. With the aid of a dual-lumen catheter, right retrograde pyelogram was obtained which again was used to identify the collecting system and showed her UPJ obstruction. Over the sensor wire with the aid of the cystoscope was 6 [place] by 24 cm double-J ureteral stent was then placed. The sensor wire was removed. A curl seen in the patient's right renal pelvis on fluoroscopy and the urinary bladder under direct visualization. At this point the patient's bladder was drained, and she was woken from anesthesia and transferred stable to the postanesthesia care unit.  Plan: The patient will follow-up in 2 months for repeat stent exchange.

## 2017-04-15 DIAGNOSIS — R413 Other amnesia: Secondary | ICD-10-CM | POA: Insufficient documentation

## 2017-04-15 DIAGNOSIS — E785 Hyperlipidemia, unspecified: Secondary | ICD-10-CM | POA: Insufficient documentation

## 2017-04-15 DIAGNOSIS — F329 Major depressive disorder, single episode, unspecified: Secondary | ICD-10-CM | POA: Insufficient documentation

## 2017-04-15 DIAGNOSIS — F32A Depression, unspecified: Secondary | ICD-10-CM | POA: Insufficient documentation

## 2017-04-15 DIAGNOSIS — N135 Crossing vessel and stricture of ureter without hydronephrosis: Secondary | ICD-10-CM | POA: Insufficient documentation

## 2017-04-15 DIAGNOSIS — D649 Anemia, unspecified: Secondary | ICD-10-CM | POA: Insufficient documentation

## 2017-04-30 ENCOUNTER — Other Ambulatory Visit: Payer: Self-pay | Admitting: Radiology

## 2017-04-30 DIAGNOSIS — N135 Crossing vessel and stricture of ureter without hydronephrosis: Secondary | ICD-10-CM

## 2017-05-23 ENCOUNTER — Ambulatory Visit (INDEPENDENT_AMBULATORY_CARE_PROVIDER_SITE_OTHER): Payer: Medicare Other

## 2017-05-23 ENCOUNTER — Encounter
Admission: RE | Admit: 2017-05-23 | Discharge: 2017-05-23 | Disposition: A | Discharge status: Home / Self Care | Payer: Medicare Other | Source: Ambulatory Visit | Attending: Urology | Admitting: Urology

## 2017-05-23 VITALS — BP 149/65 | HR 86 | Ht <= 58 in | Wt 162.0 lb

## 2017-05-23 DIAGNOSIS — Z01810 Encounter for preprocedural cardiovascular examination: Secondary | ICD-10-CM | POA: Diagnosis present

## 2017-05-23 DIAGNOSIS — F3341 Major depressive disorder, recurrent, in partial remission: Secondary | ICD-10-CM | POA: Insufficient documentation

## 2017-05-23 DIAGNOSIS — N183 Chronic kidney disease, stage 3 (moderate): Secondary | ICD-10-CM | POA: Insufficient documentation

## 2017-05-23 DIAGNOSIS — Z01812 Encounter for preprocedural laboratory examination: Secondary | ICD-10-CM | POA: Insufficient documentation

## 2017-05-23 DIAGNOSIS — E1122 Type 2 diabetes mellitus with diabetic chronic kidney disease: Secondary | ICD-10-CM | POA: Diagnosis not present

## 2017-05-23 DIAGNOSIS — J449 Chronic obstructive pulmonary disease, unspecified: Secondary | ICD-10-CM | POA: Diagnosis not present

## 2017-05-23 DIAGNOSIS — K469 Unspecified abdominal hernia without obstruction or gangrene: Secondary | ICD-10-CM | POA: Insufficient documentation

## 2017-05-23 DIAGNOSIS — N135 Crossing vessel and stricture of ureter without hydronephrosis: Secondary | ICD-10-CM

## 2017-05-23 DIAGNOSIS — I120 Hypertensive chronic kidney disease with stage 5 chronic kidney disease or end stage renal disease: Secondary | ICD-10-CM | POA: Insufficient documentation

## 2017-05-23 LAB — URINALYSIS, COMPLETE
Bilirubin, UA: NEGATIVE
Glucose, UA: NEGATIVE
Ketones, UA: NEGATIVE
Nitrite, UA: NEGATIVE
PH UA: 7 (ref 5.0–7.5)
PROTEIN UA: NEGATIVE
RBC, UA: NEGATIVE
Specific Gravity, UA: 1.01 (ref 1.005–1.030)
Urobilinogen, Ur: 0.2 mg/dL (ref 0.2–1.0)

## 2017-05-23 LAB — BASIC METABOLIC PANEL
ANION GAP: 13 (ref 5–15)
BUN: 19 mg/dL (ref 6–20)
CHLORIDE: 101 mmol/L (ref 101–111)
CO2: 27 mmol/L (ref 22–32)
Calcium: 8.9 mg/dL (ref 8.9–10.3)
Creatinine, Ser: 0.73 mg/dL (ref 0.44–1.00)
GFR calc Af Amer: >60 mL/min (ref >60)
GFR calc non Af Amer: >60 mL/min (ref >60)
GLUCOSE: 130 mg/dL — AB (ref 65–99)
POTASSIUM: 3.6 mmol/L (ref 3.5–5.1)
Sodium: 141 mmol/L (ref 135–145)

## 2017-05-23 LAB — CBC
HEMATOCRIT: 33.6 % — AB (ref 35.0–47.0)
HEMOGLOBIN: 10.4 g/dL — AB (ref 12.0–16.0)
MCH: 22.2 pg — AB (ref 26.0–34.0)
MCHC: 30.9 g/dL — AB (ref 32.0–36.0)
MCV: 71.8 fL — ABNORMAL LOW (ref 80.0–100.0)
Platelets: 284 10*3/uL (ref 150–440)
RBC: 4.68 MIL/uL (ref 3.80–5.20)
RDW: 16.5 % — ABNORMAL HIGH (ref 11.5–14.5)
WBC: 10.2 10*3/uL (ref 3.6–11.0)

## 2017-05-23 NOTE — Progress Notes (Signed)
In and Out Catheterization  Patient is present today for a I & O catheterization for pre-op urine culture. Patient was cleaned and prepped in a sterile fashion with betadine and Lidocaine 2% jelly was instilled into the urethra.  A 14FR cath was inserted no complications were noted , 219ml of urine return was noted, urine was yellow in color. A clean urine sample was collected for UA and culture. Bladder was drained  And catheter was removed with out difficulty.    Preformed by: Fonnie Jarvis, CMA

## 2017-05-23 NOTE — Patient Instructions (Signed)
Your procedure is scheduled on: Friday, May 31, 2017 Report to Same Day Surgery on the 2nd floor in the [facility]. To find out your arrival time, please call [phone] between 1PM - 3PM on: Thursday, May 30, 2017  REMEMBER: Instructions that are not followed completely may result in serious medical risk up to and including death; or upon the discretion of your surgeon and anesthesiologist your surgery may need to be rescheduled.  Do not eat food or drink liquids after midnight. No gum chewing or hard candies.  You may however, drink WATER ONLY up to 2 hours before you are scheduled to arrive at the hospital for your procedure.  Do not drink WATER within 2 hours of your scheduled arrival to the hospital as this may lead to your procedure being delayed or rescheduled.  No Alcohol for 24 hours before or after surgery.  No Smoking for 24 hours prior to surgery.  Notify your doctor if there is any change in your medical condition (cold, fever, infection).  Do not wear jewelry, make-up, hairpins, clips or nail polish.  Do not wear lotions, powders, or perfumes.   Do not shave 48 hours prior to surgery.   Contacts and dentures may not be worn into surgery.  Do not bring valuables to the hospital. [facility] is not responsible for any belongings or valuables.   TAKE THESE MEDICATIONS THE MORNING OF SURGERY WITH A SIP OF WATER:  1.  BUPROPION [facility] [place]) 2.  DONEPEZIL (ARICEPT) 3.  PANTOPRAZOLE (PROTONIX) 4.  ALBUTEROL INHALER 5.  LEVOTHYROXINE (SYNTHROID)  Bring your C-PAP to the hospital with you in case you may have to spend the night.  Stop Metformin 2 days prior to surgery. (LAST DOSE OF METFORMIN TO BE TAKEN ON Wednesday, 05/28/17) - RESUME AFTER SURGERY  Take 1/2 of usual insulin dose the night before surgery and none on the morning of surgery. TAKE 12 UNITS OF LANTUS THE NIGHT BEFORE SURGERY   Follow recommendations from Cardiologist, Pulmonologist or PCP  regarding stopping Aspirin. STOP ASPIRIN Friday, May 24, 2017  Stop Anti-inflammatories such as Advil, Aleve, Ibuprofen, Motrin, Naproxen, Naprosyn, Goodie powder, or aspirin products. (May take Tylenol or Acetaminophen if needed.)  Stop OVER THE COUNTER supplements until after surgery. (May continue multivitamin.)  If you are being discharged the day of surgery, you will not be allowed to drive home. You will need someone to drive you home and stay with you that night.   If you are taking public transportation, you will need to have a responsible adult to with you.  Please call the number above if you have any questions about these instructions.

## 2017-05-27 LAB — CULTURE, URINE COMPREHENSIVE

## 2017-05-30 MED ORDER — CEFAZOLIN SODIUM-DEXTROSE 2-4 GM/100ML-% IV SOLN
2.0000 g | INTRAVENOUS | Status: AC
  Administered 2017-05-31: 2 g via INTRAVENOUS

## 2017-05-31 ENCOUNTER — Ambulatory Visit
Admission: RE | Admit: 2017-05-31 | Discharge: 2017-05-31 | Disposition: A | Discharge status: Home / Self Care | Payer: Medicare Other | Source: Ambulatory Visit | Attending: Urology | Admitting: Urology

## 2017-05-31 ENCOUNTER — Ambulatory Visit: Payer: Medicare Other | Admitting: Anesthesiology

## 2017-05-31 ENCOUNTER — Encounter: Payer: Self-pay | Admitting: *Deleted

## 2017-05-31 ENCOUNTER — Encounter
Admission: RE | Disposition: A | Discharge status: Home / Self Care | Payer: Self-pay | Source: Ambulatory Visit | Attending: Urology

## 2017-05-31 DIAGNOSIS — N135 Crossing vessel and stricture of ureter without hydronephrosis: Secondary | ICD-10-CM | POA: Insufficient documentation

## 2017-05-31 DIAGNOSIS — E785 Hyperlipidemia, unspecified: Secondary | ICD-10-CM | POA: Diagnosis not present

## 2017-05-31 DIAGNOSIS — M109 Gout, unspecified: Secondary | ICD-10-CM | POA: Insufficient documentation

## 2017-05-31 DIAGNOSIS — F0280 Dementia in other diseases classified elsewhere without behavioral disturbance: Secondary | ICD-10-CM | POA: Insufficient documentation

## 2017-05-31 DIAGNOSIS — J449 Chronic obstructive pulmonary disease, unspecified: Secondary | ICD-10-CM | POA: Diagnosis not present

## 2017-05-31 DIAGNOSIS — G4730 Sleep apnea, unspecified: Secondary | ICD-10-CM | POA: Diagnosis not present

## 2017-05-31 DIAGNOSIS — Z96653 Presence of artificial knee joint, bilateral: Secondary | ICD-10-CM | POA: Diagnosis not present

## 2017-05-31 DIAGNOSIS — G309 Alzheimer's disease, unspecified: Secondary | ICD-10-CM | POA: Insufficient documentation

## 2017-05-31 DIAGNOSIS — M1990 Unspecified osteoarthritis, unspecified site: Secondary | ICD-10-CM | POA: Insufficient documentation

## 2017-05-31 DIAGNOSIS — E119 Type 2 diabetes mellitus without complications: Secondary | ICD-10-CM | POA: Insufficient documentation

## 2017-05-31 DIAGNOSIS — F419 Anxiety disorder, unspecified: Secondary | ICD-10-CM | POA: Diagnosis not present

## 2017-05-31 DIAGNOSIS — Z794 Long term (current) use of insulin: Secondary | ICD-10-CM | POA: Insufficient documentation

## 2017-05-31 DIAGNOSIS — F329 Major depressive disorder, single episode, unspecified: Secondary | ICD-10-CM | POA: Diagnosis not present

## 2017-05-31 DIAGNOSIS — K219 Gastro-esophageal reflux disease without esophagitis: Secondary | ICD-10-CM | POA: Insufficient documentation

## 2017-05-31 DIAGNOSIS — Z7982 Long term (current) use of aspirin: Secondary | ICD-10-CM | POA: Insufficient documentation

## 2017-05-31 DIAGNOSIS — Z79899 Other long term (current) drug therapy: Secondary | ICD-10-CM | POA: Insufficient documentation

## 2017-05-31 DIAGNOSIS — E039 Hypothyroidism, unspecified: Secondary | ICD-10-CM | POA: Insufficient documentation

## 2017-05-31 DIAGNOSIS — N130 Hydronephrosis with ureteropelvic junction obstruction: Secondary | ICD-10-CM | POA: Diagnosis not present

## 2017-05-31 DIAGNOSIS — I10 Essential (primary) hypertension: Secondary | ICD-10-CM | POA: Insufficient documentation

## 2017-05-31 DIAGNOSIS — Z9989 Dependence on other enabling machines and devices: Secondary | ICD-10-CM | POA: Diagnosis not present

## 2017-05-31 DIAGNOSIS — Z886 Allergy status to analgesic agent status: Secondary | ICD-10-CM | POA: Diagnosis not present

## 2017-05-31 HISTORY — PX: CYSTOSCOPY W/ RETROGRADES: SHX1426

## 2017-05-31 HISTORY — PX: CYSTOSCOPY W/ URETERAL STENT PLACEMENT: SHX1429

## 2017-05-31 LAB — GLUCOSE, CAPILLARY
GLUCOSE-CAPILLARY: 186 mg/dL — AB (ref 65–99)
Glucose-Capillary: 186 mg/dL — ABNORMAL HIGH (ref 65–99)

## 2017-05-31 SURGERY — CYSTOSCOPY, FLEXIBLE, WITH STENT REPLACEMENT
Anesthesia: General | Site: Ureter | Laterality: Right | Wound class: Clean Contaminated

## 2017-05-31 MED ORDER — MIDAZOLAM HCL 2 MG/2ML IJ SOLN
INTRAMUSCULAR | Status: DC | PRN
  Administered 2017-05-31: 1 mg via INTRAVENOUS

## 2017-05-31 MED ORDER — FENTANYL CITRATE (PF) 100 MCG/2ML IJ SOLN
INTRAMUSCULAR | Status: DC | PRN
  Administered 2017-05-31 (×2): 25 ug via INTRAVENOUS
  Administered 2017-05-31: 50 ug via INTRAVENOUS

## 2017-05-31 MED ORDER — CEPHALEXIN 500 MG PO CAPS: 500.0000 mg | ORAL_CAPSULE | Freq: Three times a day (TID) | ORAL | 0 refills | Status: DC

## 2017-05-31 MED ORDER — LIDOCAINE HCL (PF) 2 % IJ SOLN
INTRAMUSCULAR | Status: AC
  Filled 2017-05-31: qty 10

## 2017-05-31 MED ORDER — LABETALOL HCL 5 MG/ML IV SOLN
10.0000 mg | INTRAVENOUS | Status: DC | PRN
  Administered 2017-05-31: 10 mg via INTRAVENOUS

## 2017-05-31 MED ORDER — LABETALOL HCL 5 MG/ML IV SOLN
INTRAVENOUS | Status: AC
  Filled 2017-05-31: qty 4

## 2017-05-31 MED ORDER — ONDANSETRON HCL 4 MG/2ML IJ SOLN: 4.0000 mg | Freq: Once | INTRAMUSCULAR | Status: DC | PRN

## 2017-05-31 MED ORDER — PROPOFOL 10 MG/ML IV BOLUS
INTRAVENOUS | Status: DC | PRN
  Administered 2017-05-31: 140 mg via INTRAVENOUS
  Administered 2017-05-31: 20 mg via INTRAVENOUS

## 2017-05-31 MED ORDER — PROPOFOL 10 MG/ML IV BOLUS
INTRAVENOUS | Status: AC
  Filled 2017-05-31: qty 20

## 2017-05-31 MED ORDER — LIDOCAINE HCL 2 % EX GEL
CUTANEOUS | Status: AC
  Filled 2017-05-31: qty 5

## 2017-05-31 MED ORDER — CEFAZOLIN SODIUM-DEXTROSE 2-4 GM/100ML-% IV SOLN
INTRAVENOUS | Status: AC
Start: 2017-05-31 — End: 2017-05-31
  Filled 2017-05-31: qty 100

## 2017-05-31 MED ORDER — IPRATROPIUM-ALBUTEROL 0.5-2.5 (3) MG/3ML IN SOLN
RESPIRATORY_TRACT | Status: AC
  Filled 2017-05-31: qty 3

## 2017-05-31 MED ORDER — SUCCINYLCHOLINE CHLORIDE 20 MG/ML IJ SOLN
INTRAMUSCULAR | Status: AC
  Filled 2017-05-31: qty 1

## 2017-05-31 MED ORDER — MIDAZOLAM HCL 2 MG/2ML IJ SOLN
INTRAMUSCULAR | Status: AC
  Filled 2017-05-31: qty 2

## 2017-05-31 MED ORDER — GLYCOPYRROLATE 0.2 MG/ML IJ SOLN
INTRAMUSCULAR | Status: DC | PRN
  Administered 2017-05-31: .2 mg via INTRAVENOUS

## 2017-05-31 MED ORDER — IPRATROPIUM-ALBUTEROL 0.5-2.5 (3) MG/3ML IN SOLN
3.0000 mL | Freq: Once | RESPIRATORY_TRACT | Status: AC
  Administered 2017-05-31: 3 mL via RESPIRATORY_TRACT

## 2017-05-31 MED ORDER — FENTANYL CITRATE (PF) 100 MCG/2ML IJ SOLN: 25.0000 ug | INTRAMUSCULAR | Status: DC | PRN

## 2017-05-31 MED ORDER — GLYCOPYRROLATE 0.2 MG/ML IJ SOLN
INTRAMUSCULAR | Status: AC
  Filled 2017-05-31: qty 1

## 2017-05-31 MED ORDER — LIDOCAINE HCL (CARDIAC) 20 MG/ML IV SOLN
INTRAVENOUS | Status: DC | PRN
  Administered 2017-05-31: 60 mg via INTRAVENOUS

## 2017-05-31 MED ORDER — HYDROCODONE-ACETAMINOPHEN 5-325 MG PO TABS: 1.0000 | ORAL_TABLET | ORAL | 0 refills | Status: DC | PRN

## 2017-05-31 MED ORDER — FENTANYL CITRATE (PF) 100 MCG/2ML IJ SOLN
INTRAMUSCULAR | Status: AC
  Filled 2017-05-31: qty 2

## 2017-05-31 MED ORDER — SODIUM CHLORIDE 0.9 % IV SOLN
INTRAVENOUS | Status: DC
  Administered 2017-05-31 (×2): via INTRAVENOUS

## 2017-05-31 SURGICAL SUPPLY — 19 items
BACTOSHIELD CHG 4% 4OZ (MISCELLANEOUS) ×2
GLOVE BIO SURGEON STRL SZ7.5 (GLOVE) ×5 IMPLANT
GOWN STRL REUS W/ TWL LRG LVL4 (GOWN DISPOSABLE) ×3 IMPLANT
GOWN STRL REUS W/TWL LRG LVL4 (GOWN DISPOSABLE) ×5
GOWN STRL REUS W/TWL XL LVL4 (GOWN DISPOSABLE) ×5 IMPLANT
KIT RM TURNOVER CYSTO AR (KITS) ×5 IMPLANT
PACK CYSTO AR (MISCELLANEOUS) ×5 IMPLANT
SCRUB CHG 4% DYNA-HEX 4OZ (MISCELLANEOUS) ×3 IMPLANT
SENSORWIRE 0.038 NOT ANGLED (WIRE) ×5
SET CYSTO W/LG BORE CLAMP LF (SET/KITS/TRAYS/PACK) ×5 IMPLANT
SOL .9 NS 3000ML IRR  AL (IV SOLUTION) ×2
SOL .9 NS 3000ML IRR AL (IV SOLUTION) ×3
SOL .9 NS 3000ML IRR UROMATIC (IV SOLUTION) ×3 IMPLANT
STENT URET 6FRX24 CONTOUR (STENTS) IMPLANT
STENT URET 6FRX26 CONTOUR (STENTS) IMPLANT
STENT URO INLAY 6FRX24CM (STENTS) ×5 IMPLANT
SURGILUBE 2OZ TUBE FLIPTOP (MISCELLANEOUS) ×5 IMPLANT
WATER STERILE IRR 1000ML POUR (IV SOLUTION) ×5 IMPLANT
WIRE SENSOR 0.038 NOT ANGLED (WIRE) ×3 IMPLANT

## 2017-05-31 NOTE — H&P (Addendum)
KANSAS SPAINHOWER September 27, 1945 [id number]  Referring provider: [name], MD [facility] [place], [place] [address]     Chief Complaint Patient presents with . Follow-up   Ureteropelvic junction (UPJ) obstruction   HPI: The patient is a 71 year old female with a chronic right symptomatic UPJ obstruction that is treated with an indwelling ureteral stent exchanged every 2 months 2/2 calcification causing symptomatic obstruction of her stent if exchanges are less frequent. She is not a candidate for pyeloplasty due to her multiple medical co-morbidities. She presents today for right ureteral stent exchange.   She had a 6 mm nonobstructing left upper pole stone that was removed approximately 35months ago. It was 77% calcium oxalate monohydrate, 3% calcium phosphate, and 20% uric acid.    PMH:    Past Medical History: Diagnosis Date . Alzheimer disease  . Anxiety and depression  . Asthma  . Diabetes ([place])  . Family history of adverse reaction to anesthesia   daughter has breathing problems after anesthesia . Gout  . Hydronephrosis  . Hyperlipemia  . Hypertension  . Hypothyroidism  . Osteoarthritis  . Osteoarthritis  . Pulmonary nodule  . Retroperitoneal fibrosis  . Sleep apnea    Surgical History:     Past Surgical History: Procedure Laterality Date . ABDOMINAL HYSTERECTOMY    partial . BREAST SURGERY Bilateral 1990  Reduction . CARPAL TUNNEL RELEASE   . CESAREAN SECTION    x 3 . CYSTOSCOPY W/ RETROGRADES Right 11/02/2015  Procedure: CYSTOSCOPY WITH RETROGRADE PYELOGRAM, URETERAL STENT EXCHANGE; Surgeon: [name], MD; Location: [facility]; Service: Urology; Laterality: Right; . CYSTOSCOPY W/ RETROGRADES Right 01/31/2016  Procedure: CYSTOSCOPY WITH RETROGRADE PYELOGRAM; Surgeon: [name], MD; Location: [facility]; Service: Urology; Laterality: Right; . CYSTOSCOPY  W/ URETERAL STENT PLACEMENT Right 05/11/2015  Procedure: CYSTOSCOPY WITH STENT REPLACEMENT; Surgeon: [name], MD; Location: [facility]; Service: Urology; Laterality: Right; . CYSTOSCOPY W/ URETERAL STENT PLACEMENT Right 01/31/2016  Procedure: CYSTOSCOPY, RETROGRADE PYELOGRAMS WITH STENT REPLACEMENT; Surgeon: [name], MD; Location: [facility]; Service: Urology; Laterality: Right; . HERNIA REPAIR    umbilical . JOINT REPLACEMENT Left 2013  TKR . JOINT REPLACEMENT Right 2011  TKR . LAPAROSCOPIC HYSTERECTOMY   . REPLACEMENT TOTAL KNEE Bilateral    Home Medications:      Medication List        Accurate as of 02/29/16 10:15 AM.Always use your most recent med list.      albuterol(2.5 MG/3ML) 0.083% nebulizer solution Commonly known as: PROVENTIL Inhale 2.5 mg into the lungs every 4 (four) hours as needed for wheezing or shortness of breath. PROAIR HFA108 (90 Base) MCG/ACT inhaler Generic drug: albuterol USE 2 PUFFS EVERY FOUR HOURS AS NEEDED. alendronate70 MG tablet Commonly known as: FOSAMAX TAKE ONE TABLET EVERY WEEK. TAKE WITH A FUUL GLASS OF WATER AND DO NOT LIE DOWN FOR THE NEXT 30 MINUTES. usually on Friday or Saturday. aspirin EC81 MG tablet Take 81 mg by mouth every morning. atorvastatin40 MG tablet Commonly known as: LIPITOR Take 40 mg by mouth every morning. buPROPion150 MG 24 hr tablet Commonly known as: WELLBUTRIN XL Take 150 mg by mouth every morning. cyclobenzaprine5 MG tablet Commonly known as: FLEXERIL Take 1 tablet (5 mg total) by mouth every 8 (eight) hours as needed for muscle spasms. donepezil5 MG tablet Commonly known as: ARICEPT Take 5 mg by mouth daily. fenofibrate145 MG tablet Commonly known as: TRICOR TAKE ONE (1) TABLET EACH DAY in am furosemide20 MG  tablet Commonly known as: LASIX Take 20 mg by mouth every morning. HYDROcodone-acetaminophen5-325 MG tablet Commonly known  as: NORCO Take 1 tablet by mouth every 6 (six) hours as needed for moderate pain. insulin lispro100 UNIT/ML KiwkPen Commonly known as: HUMALOG Inject 10 Units into the skin 3 (three) times daily. LANTUS SOLOSTAR100 UNIT/ML Solostar Pen Generic drug: Insulin Glargine Inject 20 Units into the skin daily at 10 pm. levothyroxine50 MCG tablet Commonly known as: SYNTHROID, LEVOTHROID TAKE 1 TABLET ON AN EMPTY STOMACH AT LEAST 30-60 MINUTES BEFORE BREAKFAST metFORMIN1000 MG tablet Commonly known as: GLUCOPHAGE twice a day pantoprazole40 MG tablet Commonly known as: PROTONIX Take 40 mg by mouth every morning. PEN NEEDLES 31GX5/16"31G X 8 MM Misc Inject into the skin. SURE COMFORT PEN NEEDLES31G X 5 MM Misc Generic drug: Insulin Pen Needle USE 4 TIMES A DAY OR AS DIRECTED BY DOCTOR RA VITAMIN B-12 TR1000 MCG Tbcr Generic drug: Cyanocobalamin Take 1 tablet by mouth daily. senna8.6 MG tablet Commonly known as: SENOKOT Take 1 tablet by mouth 2 (two) times daily. At 8 am and 5 pm telmisartan40 MG tablet Commonly known as: MICARDIS TAKE ONE (1) TABLET EACH DAY in am vitamin E1000 UNIT capsule Take 1,000 Units by mouth daily. vitamin E400 UNIT capsule Take 400 Units by mouth every morning.    Allergies:     Allergies Allergen Reactions . Ibuprofen Itching, Nausea Only and Other (See Comments)   Family History:      Family History Problem Relation Age of Onset . Liver cancer Mother  . Colon cancer Mother  . Breast cancer Mother  . Diabetes Daughter  . Kidney disease Daughter    adrenal tumors . Kidney cancer Neg Hx  . Prostate cancer Neg Hx    Social History:reports that she has never smoked. She has never used smokeless tobacco. She reports that she does not drink alcohol or use drugs.  ROS: UROLOGY Frequent Urination?: No Hard to postpone urination?: No Burning/pain with urination?: No Get up at night to urinate?:  Yes Leakage of urine?: No Urine stream starts and stops?: No Trouble starting stream?: No Do you have to strain to urinate?: No Blood in urine?: No Urinary tract infection?: No Sexually transmitted disease?: No Injury to kidneys or bladder?: No Painful intercourse?: No Weak stream?: No Currently pregnant?: No Vaginal bleeding?: No Last menstrual period?: No  Gastrointestinal Nausea?: No Vomiting?: No Indigestion/heartburn?: No Diarrhea?: No Constipation?: No  Constitutional Fever: No Night sweats?: No Weight loss?: No Fatigue?: No  Skin Skin rash/lesions?: No Itching?: No  Eyes Blurred vision?: No Double vision?: No  Ears/Nose/Throat Sore throat?: No Sinus problems?: No  Hematologic/Lymphatic Swollen glands?: No Easy bruising?: No  Cardiovascular Leg swelling?: Yes Chest pain?: No  Respiratory Cough?: No Shortness of breath?: Yes  Endocrine Excessive thirst?: No  Musculoskeletal Back pain?: No Joint pain?: Yes  Neurological Headaches?: Yes Dizziness?: No  Psychologic Depression?: Yes Anxiety?: Yes  Physical Exam: BP (!) 149/72 (BP Location: Left Arm, Patient Position: Sitting, Cuff Size: Normal)  Pulse 90  Ht 4\' 8"  (1.422 m)  Wt 160 lb 11.2 oz (72.9 kg)  BMI 36.03 kg/m  Constitutional: Alert and oriented, No acute distress. HEENT: [place] AT, moist mucus membranes. Trachea midline, no masses. Cardiovascular: No clubbing, cyanosis, or edema. RRR. Respiratory:Normal respiratory effort,no increased work of breathing. Lungs clear. GI: Abdomen is soft, nontender, nondistended, no abdominal masses GU: No CVA tenderness.  Skin: No rashes, bruises or suspicious lesions. Lymph: No cervical or inguinal adenopathy. Neurologic: Grossly  intact, no focal deficits, moving all 4 extremities. Psychiatric: Normal mood and affect.  Laboratory Data: RecentLabs     Lab  Results Component Value Date  WBC 7.2 12/29/2015  HGB 10.5 (L) 12/29/2015  HCT 33.2 (L) 12/29/2015  MCV 74.1 (L) 12/29/2015  PLT 286 12/29/2015    RecentLabs     Lab Results Component Value Date  CREATININE 0.90 12/29/2015    RecentLabs No results found for: PSA   RecentLabs No results found for: TESTOSTERONE   RecentLabs No results found for: HGBA1C   Urinalysis Labs(Brief)      Component Value Date/Time  COLORURINE YELLOW (A) 01/18/2016 1106  APPEARANCEUR Clear 01/23/2016 0933  LABSPEC 1.016 01/18/2016 1106  LABSPEC 1.015 08/08/2011 1106  PHURINE 6.0 01/18/2016 1106  GLUCOSEU Negative 01/23/2016 0933  GLUCOSEU Negative 08/08/2011 1106  HGBUR 1+ (A) 01/18/2016 1106  BILIRUBINUR Negative 01/23/2016 0933  BILIRUBINUR Negative 08/08/2011 1106  KETONESUR NEGATIVE 01/18/2016 1106  PROTEINUR Negative 01/23/2016 0933  PROTEINUR NEGATIVE 01/18/2016 1106  NITRITE Negative 01/23/2016 0933  NITRITE NEGATIVE 01/18/2016 1106  LEUKOCYTESUR Trace (A) 01/23/2016 0933  LEUKOCYTESUR Negative 08/08/2011 1106    Assessment &Plan:   1. Right UPJ obstruction -Right stent exchange today   Addendum: Patient also complains of left flank discomfort. Will also perform left RGP to rule out left hydronephrosis as source.

## 2017-05-31 NOTE — Anesthesia Postprocedure Evaluation (Signed)
Anesthesia Post Note  Patient: Lori Blanchard  Procedure(s) Performed: CYSTOSCOPY WITH STENT REPLACEMENT (Right Ureter) CYSTOSCOPY WITH RETROGRADE PYELOGRAM (Bilateral Ureter)  Patient location during evaluation: PACU Anesthesia Type: General Level of consciousness: awake and alert and oriented Pain management: pain level controlled Vital Signs Assessment: post-procedure vital signs reviewed and stable Respiratory status: spontaneous breathing Cardiovascular status: blood pressure returned to baseline Anesthetic complications: no     Last Vitals:  Vitals:   05/31/17 1000 05/31/17 1026  BP: (!) 169/78 (!) 156/83  Pulse: 88 76  Resp: 12   Temp: (!) 35.6 C   SpO2: 96% 93%    Last Pain:  Vitals:   05/31/17 1000  TempSrc: Temporal  PainSc:                  Lori Blanchard

## 2017-05-31 NOTE — Anesthesia Post-op Follow-up Note (Signed)
Anesthesia QCDR form completed.        

## 2017-05-31 NOTE — Op Note (Signed)
Date of procedure: 05/31/17  Preoperative diagnosis:  1. Right UPJ obstruction 2. Left flank pain  Postoperative diagnosis:  1. Right UPJ obstruction 2. Left flank pain  Procedure: 1. Cystoscopy 2. Bilateral retrograde pyelogram with interpretation 3. Right ureteral stent exchange 6 [place] by 24 cm - Bard optima  Surgeon: Baruch Gouty, MD  Anesthesia: General  Complications: None  Intraoperative findings: The patient had her previously known right UPJ obstruction was confirmed with a right retrograde pyelogram used to identify the collecting system. She underwent a successful stent exchange. Her left retrograde pyelogram was unremarkable for filling defects or hydronephrosis. There was good drainage of contrast and postdrainage films.  EBL: None  Specimens: None  Drains: Right 6 French by 24 cm double-J ureteral stent - Bard Optima  Disposition: Stable to the postanesthesia care unit  Indicationfor procedure: The patient is a27 y.o.femalewith a right UPJ obstruction that she is symptomatic from. She is a poor surgical candidate and has been temporized with a right ureteral stent. She does have issues with calcifying her stent unusually fast resulting in right flank pain. She presents today for her two-month stent exchange as she cannot tolerate a longer interval between exchanges. She also endorses left flank pain so a left retrograde pyelogram is to be performed as well. After reviewing the management options for treatment, the patient elected to proceed with the above surgical procedure(s). We have discussed the potential benefits and risks of the procedure, side effects of the proposed treatment, the likelihood of the patient achieving the goals of the procedure, and any potential problems that might occur during the procedure or recuperation. Informed consent has been obtained.  Description of procedure: The patient was met in the preoperative area. All  risks, benefits, and indications of the procedure were described in great detail. The patient consented to the procedure. Preoperative antibiotics were given. The patient was taken to the operative theater. General anesthesia was induced per the anesthesia service. The patient was then placed in the dorsal lithotomy position and prepped and draped in the usual sterile fashion. A preoperative timeout was called.   A 21 French 30 cystoscope was inserted into the patient's bladder per urethra atraumatically. The right ureteral stent was grasped flexible graspers and brought to level the urethral meatus. A sensor wire was exchanged through the right ureteral stent to level of the renal pelvis and the stent was removed. With the aid of a dual-lumen catheter, right retrograde pyelogram was obtained which again was used to identify the collecting system and showed her UPJ obstruction. Over the sensor wire with the aid of the cystoscope was 6 [place] by 24 cm double-J ureteral stent was then placed. The sensor wire was removed. A curl seen in the patient's right renal pelvis on fluoroscopy and the urinary bladder under direct visualization. An open endedcatheter was then used to obtain a left retrograde pyelogram which was unremarkable. There were no filling defects or retention of contrast in postdrainage films. No evidence of hydronephrosis. At this point the patient's bladder was drained, and she was woken from anesthesia and transferred stable to the postanesthesia care unit.  Plan: The patient will follow-up in 2 months for repeat stent exchange. This is the first time that she has a Bard optima stent. Hopefully the stent will not calcify as quickly as her previous stents. If there is no evidence of calcification at her next stent exchange, we will try to start increasing her ureteral stent exchange intervals using a Bard Optima.

## 2017-05-31 NOTE — Anesthesia Procedure Notes (Signed)
Procedure Name: LMA Insertion Date/Time: 05/31/2017 8:00 AM Performed by: Allean Found Pre-anesthesia Checklist: Patient identified, Emergency Drugs available, Suction available, Patient being monitored and Timeout performed Patient Re-evaluated:Patient Re-evaluated prior to induction Oxygen Delivery Method: Circle system utilized Preoxygenation: Pre-oxygenation with 100% oxygen Induction Type: IV induction Ventilation: Mask ventilation without difficulty LMA: LMA inserted LMA Size: 4.0 Number of attempts: 1 Placement Confirmation: positive ETCO2 Tube secured with: Tape Dental Injury: Teeth and Oropharynx as per pre-operative assessment

## 2017-05-31 NOTE — Transfer of Care (Signed)
Immediate Anesthesia Transfer of Care Note  Patient: Lori Blanchard  Procedure(s) Performed: CYSTOSCOPY WITH STENT REPLACEMENT (Right Ureter) CYSTOSCOPY WITH RETROGRADE PYELOGRAM (Bilateral Ureter)  Patient Location: PACU  Anesthesia Type:General  Level of Consciousness: sedated  Airway & Oxygen Therapy: Patient Spontanous Breathing and Patient connected to face mask oxygen  Post-op Assessment: Report given to RN and Post -op Vital signs reviewed and stable  Post vital signs: Reviewed and stable  Last Vitals:  Vitals:   05/31/17 0612 05/31/17 0836  BP: (!) 173/89 (!) 191/110  Pulse: 93 (!) 123  Resp: 16 13  Temp: 36.6 C (!) 36.2 C  SpO2: 96% 96%    Last Pain:  Vitals:   05/31/17 0836  TempSrc: Temporal         Complications: No apparent anesthesia complications

## 2017-05-31 NOTE — Anesthesia Preprocedure Evaluation (Signed)
Anesthesia Evaluation  Patient identified by MRN, date of birth, ID band Patient awake    Reviewed: Allergy & Precautions, H&P , NPO status , Patient's Chart, lab work & pertinent test results  History of Anesthesia Complications (+) Family history of anesthesia reactionNegative for: history of anesthetic complications  Airway Mallampati: III  TM Distance: <3 FB Neck ROM: limited    Dental  (+) Poor Dentition, Chipped, Missing   Pulmonary shortness of breath and with exertion, asthma , sleep apnea and Continuous Positive Airway Pressure Ventilation , COPD, neg recent URI,           Cardiovascular Exercise Tolerance: Good hypertension, (-) angina(-) Past MI      Neuro/Psych PSYCHIATRIC DISORDERS Anxiety Depression negative neurological ROS     GI/Hepatic Neg liver ROS, GERD  ,  Endo/Other  negative endocrine ROSdiabetes, Well Controlled, Type 2Hypothyroidism   Renal/GU Renal disease     Musculoskeletal  (+) Arthritis ,   Abdominal   Peds  Hematology negative hematology ROS (+)   Anesthesia Other Findings Past Medical History: No date: Alzheimer disease No date: Anxiety and depression No date: Asthma No date: COPD (chronic obstructive pulmonary disease) (* No date: Depression No date: Diabetes (HCC) No date: Family history of adverse reaction to anesthes*     Comment: daughter has breathing problems after               anesthesia No date: Gout No date: History of kidney stones No date: Hydronephrosis No date: Hyperlipemia No date: Hypertension No date: Hypothyroidism No date: Osteoarthritis No date: Osteoarthritis No date: Pulmonary nodule 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 No date: Sleep apnea  Past Surgical History: No date: ABDOMINAL HYSTERECTOMY     Comment: partial 1965: APPENDECTOMY 1990: BREAST SURGERY Bilateral     Comment: Reduction No date: CARPAL TUNNEL RELEASE No date: CESAREAN SECTION      Comment: x 3 11/02/2015: CYSTOSCOPY W/ RETROGRADES Right     Comment: Procedure: CYSTOSCOPY WITH RETROGRADE               PYELOGRAM, URETERAL STENT EXCHANGE;  Surgeon:               [name], MD;  Location: [facility];                Service: Urology;  Laterality: Right; 01/31/2016: CYSTOSCOPY W/ RETROGRADES Right     Comment: Procedure: CYSTOSCOPY WITH RETROGRADE               PYELOGRAM;  Surgeon: [name], MD;                Location: [facility];  Service: Urology;                Laterality: Right; 05/09/2016: CYSTOSCOPY W/ RETROGRADES Bilateral     Comment: Procedure: CYSTOSCOPY WITH RETROGRADE               PYELOGRAM;  Surgeon: [name], MD;                Location: [facility];  Service: Urology;                Laterality: Bilateral; 05/11/2015: CYSTOSCOPY W/ URETERAL STENT PLACEMENT Right     Comment: Procedure: CYSTOSCOPY WITH STENT REPLACEMENT;               Surgeon: [name], MD;  Location:  [facility];  Service: Urology;  Laterality:               Right; 01/31/2016: CYSTOSCOPY W/ URETERAL STENT PLACEMENT Right     Comment: Procedure: CYSTOSCOPY, RETROGRADE PYELOGRAMS               WITH STENT REPLACEMENT;  Surgeon: [name], MD;  Location: [facility];  Service:               Urology;  Laterality: Right; 05/09/2016: CYSTOSCOPY W/ URETERAL STENT PLACEMENT Right     Comment: Procedure: CYSTOSCOPY WITH STENT REPLACEMENT;               Surgeon: [name], MD;  Location:               [facility];  Service: Urology;  Laterality:               Right; 07/04/2016: CYSTOSCOPY W/ URETERAL STENT PLACEMENT Bilateral     Comment: Procedure: CYSTOSCOPY WITH STENT REPLACEMENT;               Surgeon: [name], MD;  Location:               [facility];  Service: Urology;  Laterality:               Bilateral; 05/09/2016: CYSTOSCOPY WITH STENT PLACEMENT Left     Comment: Procedure: CYSTOSCOPY WITH STENT PLACEMENT;                 Surgeon: [name], MD;  Location:               [facility];  Service: Urology;  Laterality: Left; No date: HERNIA REPAIR     Comment: umbilical 1700: JOINT REPLACEMENT Left     Comment: TKR 2011: JOINT REPLACEMENT Right     Comment: TKR No date: LAPAROSCOPIC HYSTERECTOMY No date: REDUCTION MAMMAPLASTY No date: REPLACEMENT TOTAL KNEE Bilateral 05/09/2016: URETEROSCOPY Bilateral     Comment: Procedure: URETEROSCOPY;  Surgeon: [name], MD;  Location: [facility];  Service:               Urology;  Laterality: Bilateral; 07/04/2016: URETEROSCOPY WITH HOLMIUM LASER LITHOTRIPSY Left     Comment: Procedure: URETEROSCOPY WITH HOLMIUM LASER               LITHOTRIPSY;  Surgeon: [name], MD;               Location: [facility];  Service: Urology;                Laterality: Left;  BMI    Body Mass Index:  34.19 kg/m      Reproductive/Obstetrics negative OB ROS                             Anesthesia Physical  Anesthesia Plan  ASA: III  Anesthesia Plan: General LMA   Post-op Pain Management:    Induction: Intravenous  PONV Risk Score and Plan: 3 and Ondansetron, Dexamethasone and Treatment may vary due to age or medical condition  Airway Management Planned: LMA  Additional Equipment:   Intra-op Plan:   Post-operative Plan:   Informed Consent: I have reviewed the patients History and Physical, chart,  labs and discussed the procedure including the risks, benefits and alternatives for the proposed anesthesia with the patient or authorized representative who has indicated his/her understanding and acceptance.   Dental Advisory Given  Plan Discussed with: Anesthesiologist, CRNA and Surgeon  Anesthesia Plan Comments:         Anesthesia Quick Evaluation

## 2017-05-31 NOTE — Discharge Instructions (Addendum)

## 2017-06-07 ENCOUNTER — Telehealth: Payer: Self-pay

## 2017-06-07 NOTE — Telephone Encounter (Signed)
-----   Message from [name], MD sent at 05/31/2017  8:34 AM EDT ----- Patient needs cysto, right stent exchange in 2 months.   When I told the patient another physician may be the one exchanging her stent, she strongly requested that I continue to perform this procedure for her. If possible, she would like for me to due her stent exchange. Maybe I can do it in the 7:30 spot on a Friday and then whoever else is scheduled that day in the OR can start at 8:30.

## 2017-06-07 NOTE — Telephone Encounter (Signed)
Patient notified that surgery has been scheduled on 08-02-17 with Dr. Pilar Jarvis will mail a reminder to the patient as well

## 2017-07-10 ENCOUNTER — Other Ambulatory Visit: Payer: Self-pay | Admitting: Radiology

## 2017-07-10 DIAGNOSIS — Z01818 Encounter for other preprocedural examination: Secondary | ICD-10-CM

## 2017-07-23 ENCOUNTER — Ambulatory Visit (INDEPENDENT_AMBULATORY_CARE_PROVIDER_SITE_OTHER): Payer: Medicare Other

## 2017-07-23 ENCOUNTER — Other Ambulatory Visit: Payer: Self-pay

## 2017-07-23 ENCOUNTER — Other Ambulatory Visit: Payer: Self-pay | Admitting: Radiology

## 2017-07-23 ENCOUNTER — Encounter
Admission: RE | Admit: 2017-07-23 | Discharge: 2017-07-23 | Disposition: A | Discharge status: Home / Self Care | Payer: Medicare Other | Source: Ambulatory Visit | Attending: Urology | Admitting: Urology

## 2017-07-23 DIAGNOSIS — G4730 Sleep apnea, unspecified: Secondary | ICD-10-CM | POA: Insufficient documentation

## 2017-07-23 DIAGNOSIS — E1122 Type 2 diabetes mellitus with diabetic chronic kidney disease: Secondary | ICD-10-CM | POA: Diagnosis not present

## 2017-07-23 DIAGNOSIS — Z01812 Encounter for preprocedural laboratory examination: Secondary | ICD-10-CM | POA: Diagnosis not present

## 2017-07-23 DIAGNOSIS — Z01818 Encounter for other preprocedural examination: Secondary | ICD-10-CM | POA: Diagnosis not present

## 2017-07-23 DIAGNOSIS — J449 Chronic obstructive pulmonary disease, unspecified: Secondary | ICD-10-CM | POA: Diagnosis not present

## 2017-07-23 DIAGNOSIS — E039 Hypothyroidism, unspecified: Secondary | ICD-10-CM | POA: Insufficient documentation

## 2017-07-23 DIAGNOSIS — M109 Gout, unspecified: Secondary | ICD-10-CM | POA: Diagnosis not present

## 2017-07-23 DIAGNOSIS — K635 Polyp of colon: Secondary | ICD-10-CM | POA: Diagnosis not present

## 2017-07-23 DIAGNOSIS — K760 Fatty (change of) liver, not elsewhere classified: Secondary | ICD-10-CM | POA: Diagnosis not present

## 2017-07-23 DIAGNOSIS — E785 Hyperlipidemia, unspecified: Secondary | ICD-10-CM | POA: Insufficient documentation

## 2017-07-23 DIAGNOSIS — F3341 Major depressive disorder, recurrent, in partial remission: Secondary | ICD-10-CM | POA: Insufficient documentation

## 2017-07-23 DIAGNOSIS — E668 Other obesity: Secondary | ICD-10-CM | POA: Insufficient documentation

## 2017-07-23 DIAGNOSIS — E1169 Type 2 diabetes mellitus with other specified complication: Secondary | ICD-10-CM | POA: Insufficient documentation

## 2017-07-23 DIAGNOSIS — N183 Chronic kidney disease, stage 3 (moderate): Secondary | ICD-10-CM | POA: Insufficient documentation

## 2017-07-23 DIAGNOSIS — I129 Hypertensive chronic kidney disease with stage 1 through stage 4 chronic kidney disease, or unspecified chronic kidney disease: Secondary | ICD-10-CM | POA: Insufficient documentation

## 2017-07-23 HISTORY — DX: Heart failure, unspecified: I50.9

## 2017-07-23 HISTORY — DX: Age-related osteoporosis without current pathological fracture: M81.0

## 2017-07-23 HISTORY — DX: Fatty (change of) liver, not elsewhere classified: K76.0

## 2017-07-23 HISTORY — DX: Localized edema: R60.0

## 2017-07-23 HISTORY — DX: Unspecified injury of ureter, initial encounter: S37.10XA

## 2017-07-23 HISTORY — DX: Hyperlipidemia, unspecified: E78.5

## 2017-07-23 LAB — MICROSCOPIC EXAMINATION

## 2017-07-23 LAB — URINALYSIS, COMPLETE
Bilirubin, UA: NEGATIVE
GLUCOSE, UA: NEGATIVE
Ketones, UA: NEGATIVE
Leukocytes, UA: NEGATIVE
NITRITE UA: NEGATIVE
SPEC GRAV UA: 1.015 (ref 1.005–1.030)
UUROB: 0.2 mg/dL (ref 0.2–1.0)
pH, UA: 7.5 (ref 5.0–7.5)

## 2017-07-23 LAB — POTASSIUM: POTASSIUM: 3.8 mmol/L (ref 3.5–5.1)

## 2017-07-23 NOTE — Progress Notes (Signed)
In and Out Catheterization  Patient is present today for a I & O catheterization due to stent exchange. Patient was cleaned and prepped in a sterile fashion with betadine and Lidocaine 2% jelly was instilled into the urethra.  A 14FR cath was inserted no complications were noted , 166ml of urine return was noted, urine was yellow in color. A clean urine sample was collected for u/a and cx. Bladder was drained  And catheter was removed with out difficulty.    Preformed by: Toniann Fail, LPN   Blood pressure (!) 166/87, pulse 88, height 4\' 8"  (1.422 m), weight 162 lb (73.5 kg).

## 2017-07-23 NOTE — Patient Instructions (Signed)
Your procedure is scheduled on: 08/02/17 Fri Report to Same Day Surgery 2nd floor medical mall [facility] Entrance-take elevator on left to 2nd floor.  Check in with surgery information desk.) To find out your arrival time please call [phone] between 1PM - 3PM on 08/01/17 Thur  Remember: Instructions that are not followed completely may result in serious medical risk, up to and including death, or upon the discretion of your surgeon and anesthesiologist your surgery may need to be rescheduled.    _x___ 1. Do not eat food after midnight the night before your procedure. You may drink clear liquids up to 2 hours before you are scheduled to arrive at the hospital for your procedure.  Do not drink clear liquids within 2 hours of your scheduled arrival to the hospital.  Clear liquids include  --Water or Apple juice without pulp  --Clear carbohydrate beverage such as ClearFast or Gatorade  --Black Coffee or Clear Tea (No milk, no creamers, do not add anything to                  the coffee or Tea Type 1 and type 2 diabetics should only drink water.  No gum chewing or hard candies.     __x__ 2. No Alcohol for 24 hours before or after surgery.   __x__3. No Smoking for 24 prior to surgery.   ____  4. Bring all medications with you on the day of surgery if instructed.    __x__ 5. Notify your doctor if there is any change in your medical condition     (cold, fever, infections).     Do not wear jewelry, make-up, hairpins, clips or nail polish.  Do not wear lotions, powders, or perfumes. You may wear deodorant.  Do not shave 48 hours prior to surgery. Men may shave face and neck.  Do not bring valuables to the hospital.    [facility] is not responsible for any belongings or valuables.               Contacts, dentures or bridgework may not be worn into surgery.  Leave your suitcase in the car. After surgery it may be brought to your room.  For patients admitted to the hospital,  discharge time is determined by your                       treatment team.   Patients discharged the day of surgery will not be allowed to drive home.  You will need someone to drive you home and stay with you the night of your procedure.    Please read over the following fact sheets that you were given:   [facility] Preparing for Surgery and or MRSA Information   _x___ Take anti-hypertensive listed below, cardiac, seizure, asthma,     anti-reflux and psychiatric medicines. These include:  1. albuterol (PROVENTIL HFA;VENTOLIN HFA)  2.atorvastatin (LIPITOR) 40 MG tablet  3.buPROPion (WELLBUTRIN XL) 150 MG 24 hr tablet  4.donepezil (ARICEPT) 5 MG tablet  5.levothyroxine (SYNTHROID, LEVOTHROID) 75 MCG tablet  6.pantoprazole (PROTONIX) 40 MG tablet  ____Fleets enema or Magnesium Citrate as directed.   ____ Use CHG Soap or sage wipes as directed on instruction sheet   ____ Use inhalers on the day of surgery and bring to hospital day of surgery  ____ Stop Metformin and Janumet 2 days prior to surgery.    __x__ Take 1/2 of usual insulin dose the night before surgery and none  on the morning     surgery.   _x___ Follow recommendations from Cardiologist, Pulmonologist or PCP regarding          stopping Aspirin, Coumadin, Plavix ,Eliquis, Effient, or Pradaxa, and Pletal.  Stop aspirin 1 week before surgery if OK with physician  X____Stop Anti-inflammatories such as Advil, Aleve, Ibuprofen, Motrin, Naproxen, Naprosyn, Goodies powders or aspirin products. OK to take Tylenol and                          Celebrex.   _x___ Stop supplements until after surgery.  But may continue Vitamin D, Vitamin B,       and multivitamin.   __x__ Bring C-Pap to the hospital.

## 2017-07-23 NOTE — Pre-Procedure Instructions (Signed)
History of Present Illness: Lori Blanchard is a 71 y.o. female presents to clinic for RECHECK. Here with a cough, sob.   IMPRESSION: 1. Pulmonary parenchymal pattern of fibrosis, as discussed above, is likely stable from prior examinations and may be postinflammatory in etiology. Fibrotic nonspecific interstitial pneumonitis is another consideration. 2. Aortic atherosclerosis (ICD10-170.0). Coronary artery calcification.  Electronically Signed By: Cleatrice Burke M.D. On: 10/22/2016 13:42 Current Medications:  Current Outpatient Medications  Medication Sig Dispense Refill  . albuterol (PROVENTIL) 2.5 mg /3 mL (0.083 %) nebulizer solution Take 3 mLs (2.5 mg total) by nebulization every 6 (six) hours as needed for Wheezing. 360 ampule 3  . alendronate (FOSAMAX) 70 MG tablet Take 1 tablet (70 mg total) by mouth every 7 (seven) days Take with a full glass of water. Do not lie down for the next 30 min. 13 tablet 3  . aspirin 81 MG EC tablet Take 81 mg by mouth once daily.  Marland Kitchen atorvastatin (LIPITOR) 40 MG tablet TAKE ONE TABLET DAILY 90 tablet 1  . blood glucose diagnostic (ONETOUCH ULTRA BLUE TEST STRIP) test strip Use 3 (three) times daily. Use as instructed. 300 each 3  . buPROPion (WELLBUTRIN XL) 150 MG XL tablet TAKE ONE (1) TABLET EACH DAY 30 tablet 10  . donepezil (ARICEPT) 5 MG tablet Take 1 tablet (5 mg total) by mouth nightly 90 tablet 1  . FUROsemide (LASIX) 20 MG tablet TAKE ONE (1) TABLET EACH DAY 90 tablet 2  . insulin GLARGINE (LANTUS SOLOSTAR U-100 INSULIN) pen injector (concentration 100 units/mL) Inject 45 Units subcutaneously nightly 45 mL 3  . insulin LISPRO (HUMALOG KWIKPEN INSULIN) pen injector (concentration 100 units/mL) Inject 12 Units subcutaneously 3 (three) times daily with meals 9 Syringe 3  . levothyroxine (SYNTHROID, LEVOTHROID) 75 MCG tablet Take 1 tablet (75 mcg total) by mouth once daily. Take on an empty stomach with a glass of water at least 30-60 minutes  before breakfast. 90 tablet 1  . metFORMIN (GLUCOPHAGE) 1000 MG tablet TAKE 1 TABLET BY MOUTH IN THE MORNING AND 1 AND 1/2 TABLETS AT SUPPER 225 tablet 3  . MULTIVITAMIN (CHEWABLE-VITE ORAL) Take by mouth.  Glory Rosebush DELICA LANCETS 30 gauge Misc USE 1 LANCET 2 TO 3 TIMES DAILY TO CHECK BLOOD SUGAR 200 each 3  . pantoprazole (PROTONIX) 40 MG DR tablet Take 1 tablet (40 mg total) by mouth once daily. 90 tablet 3  . pantoprazole (PROTONIX) 40 MG DR tablet TAKE ONE (1) TABLET EACH DAY 30 tablet 5  . sennosides-docusate (SENOKOT-S) 8.6-50 mg tablet Take by mouth.  Haig Prophet COMFORT PEN NEEDLE 31 gauge x 3/16" needle USE AS DIRECTED 120 each 5  . telmisartan (MICARDIS) 40 MG tablet Take 1 tablet (40 mg total) by mouth once daily. 90 tablet 3  . traZODone (DESYREL) 50 MG tablet Take 0.5 tablets (25 mg total) by mouth nightly. 90 tablet 3   No current facility-administered medications for this visit.   Problem List:  Patient Active Problem List  Diagnosis  . Type 2 diabetes mellitus with stage 3 chronic kidney disease (CMS-HCC)  . Hyperlipidemia associated with type 2 diabetes mellitus , unspecified (CMS-HCC)  . OA (osteoarthritis)  . Hypothyroid, unspecified  . Hypersomnia with sleep apnea, unspecified  . Gout  . Abdominal hernia  . Fatty liver  . Colon polyp  . Asthma without status asthmaticus, unspecified  . Osteoarthrosis, unspecified whether generalized or localized, lower leg  . Anxiety, unspecified  . COPD, moderate , unspecified (CMS-HCC)  .  Severe obesity (BMI 35.0-39.9) with comorbidity ([place])  . Health care maintenance  . Health care maintenance  . Late onset Alzheimer's disease without behavioral disturbance  . Major depression in remission (CMS-HCC)  . OSA on CPAP   History: Past Medical History:  Diagnosis Date  . Abdominal hernia  . Alzheimer's disease with early onset  Dr Manuella Ghazi 05/2015  . Anxiety, unspecified  . Asthma without status asthmaticus, unspecified  .  Chronic airway obstruction, not elsewhere classified , unspecified (CMS-HCC)  . Chronic kidney disease  . Colon polyp  Adenomatous  . CPAP (continuous positive airway pressure) dependence  . Depression, unspecified  . Fatty liver  . Gout  . Hyperlipidemia, unspecified  . Hypersomnia with sleep apnea, unspecified  . Hypertension  . Knee joint replacement by other means  . Morbid obesity (CMS-HCC)  . Osteoarthritis  . Osteoarthrosis, unspecified whether generalized or localized, lower leg  . Osteoporosis, post-menopausal  . Sleep apnea  OSA on CPAP  . Thyroid disease  . Type II or unspecified type diabetes mellitus without mention of complication, not stated as uncontrolled (CMS-HCC)  No retinopathy, last eye exam at [facility] in 2009.  . Type II or unspecified type diabetes mellitus without mention of complication, uncontrolled (CMS-HCC)   Past Surgical History:  Procedure Laterality Date  . Breast reduction surgery.  . Carpal tunnel repair.  Marland Kitchen CESAREAN SECTION  . COLONOSCOPY 08/15/2000  Adenomatous polyp, [facility] (Mother)  . COLONOSCOPY 03/12/1988  Hemorrhoid  . COLONOSCOPY 09/16/2007  Hyperplastic polyp  . COLONOSCOPY 11/13/2012  . Left total knee arthroplasty 08/22/2011  . Right total knee arthroplasty 01/13/2010  . UPPER GASTROINTESTINAL ENDOSCOPY 09/02/1990  Duodenitis, Gastritis, HH  . UPPER GASTROINTESTINAL ENDOSCOPY 08/15/2000, 11/13/2012  Gastritis, [place], HH , 11/13/12 - No repeat EGD  . UPPER GASTROINTESTINAL ENDOSCOPY 09/16/2007  HH  . UPPER GASTROINTESTINAL ENDOSCOPY 11/13/2012  HH, Gastric Polyp  . URETEROTOMY W/INSERTION INDWELLING STENT 05/2015   Family History  Problem Relation Age of Onset  . Breast cancer Mother  died from metastatic breast cancer  . Colon cancer Mother  . Glaucoma Mother  . Liver cancer Mother  . Diabetes type II Father  . Heart failure Father  died from CHF  . High blood pressure (Hypertension) Father  . High blood  pressure (Hypertension) Other  . Melanoma Other  malignant  . Cancer Other  father's side of family   Social History   Socioeconomic History  . Marital status: Divorced  Spouse name: None  . Number of children: 4  . Years of education: 10  . Highest education level: None  Social Needs  . Financial resource strain: None  . Food insecurity - worry: None  . Food insecurity - inability: None  . Transportation needs - medical: None  . Transportation needs - non-medical: None  Occupational History  . Occupation: Disabled- retired  Tobacco Use  . Smoking status: Never Smoker  . Smokeless tobacco: Never Used  . Tobacco comment: No tobacco  Substance and Sexual Activity  . Alcohol use: No  . Drug use: No  . Sexual activity: Defer  Other Topics Concern  . None  Social History Narrative  She is divorced.   Allergies:  Ibuprofen  Review of Systems: As per above. Pretty much unchanged with the exception that her breathing is improved. No associated cardiopulmonary, GI, GU, dermatological symptoms today. No focal neurological symptoms or psychological changes.   Physical Exam: BP 140/66  Pulse 100  Temp 36.7 C (98.1  F) (Oral)  Wt 74.8 kg (165 lb)  SpO2 91%  BMI 36.99 kg/m 74.8 kg (165 lb) 91% General: NAD. Able to speak in complete sentences without cough or dyspnea HEENT: Normocephalic, nontraumatic. Extraocular movements intact NECK: Supple. No JVD, nodes, thryomegaly CV: RRR no murmurs, gallops, rubs PULM: Normal respiratory effort, Clear to auscultation bilaterally without wheezing or crackles EXTREMITIESs: No significant edema, cyanosis or Homans'signs SKIN: Fair turgor. No rashes LYMPHATIC: No nodes NEURO: No gross deficits PSYCH: Appropriate affect   Diagnostics: LUNG VOLUMES: TLC was 78% of predicted RV was 70% of predicted  DIFFUSION CAPACITY: DLCO was 39% of predicted DLCO/VA was 79% of predicted  Impression TLC is mildly decreased DLCO is  severely decreased partially corrected for VA  *Compared to Previous Study, numbers are consistent.  Impression: 1. Bilateral ground glass infiltrate,status post bronchoscopy, Chest CT scan suggest NSIP. Clinically no worse. On portable oxygen. C/o cough. No blood, not taking her meds 2. Subcentimeter pulmonary nodules, no change, looks beingn  3. Sleep apnea on CPAP/02. Rested    Plan 1. Continue prn albuterol ( not taking it) 2. Watch weight 3. Nocturnal oxygen/cpap as is ( cpap machine may be malfunctioning). To have DME ([employer]) check out the equipment 4. Encourage to wear the portable oxygen 5. Tessalon perles, otc cough 6. Follow up in 4 months, dlco, lung volumes    Electronically Signed by Erby Pian, MD on 07/22/2017 2:45 PM EST  Back to top of Progress Notes Marja Kays, RT - 07/22/2017 2:45 PM EST Procedure Diffusion and Lung Volumes  Reason for PFT SHORTNESS OF BREATH in Non Smoker  Findings Lori Blanchard is a 71 y.o. female reports that she has never smoked. She has never used smokeless tobacco.  LUNG VOLUMES: TLC was 78% of predicted RV was 70% of predicted  DIFFUSION CAPACITY: DLCO was 39% of predicted DLCO/VA was 79% of predicted  Impression TLC is mildly decreased DLCO is severely decreased partially corrected for VA  *Compared to Previous Study, numbers are consistent.  Electronically Signed by Marja Kays, RT on 07/22/2017 2:45 PM EST  Back to top of Progress Notes

## 2017-07-23 NOTE — Pre-Procedure Instructions (Signed)
Received call from Amy at [facility].  "Cancel urine culture."  Lab called and notified regarding above information.

## 2017-07-24 ENCOUNTER — Other Ambulatory Visit: Payer: Self-pay | Admitting: Specialist

## 2017-07-24 DIAGNOSIS — J8489 Other specified interstitial pulmonary diseases: Secondary | ICD-10-CM

## 2017-07-24 DIAGNOSIS — R053 Chronic cough: Secondary | ICD-10-CM

## 2017-07-24 DIAGNOSIS — R05 Cough: Secondary | ICD-10-CM

## 2017-07-24 DIAGNOSIS — R0602 Shortness of breath: Secondary | ICD-10-CM

## 2017-07-26 LAB — CULTURE, URINE COMPREHENSIVE

## 2017-08-01 MED ORDER — CEFAZOLIN SODIUM-DEXTROSE 2-4 GM/100ML-% IV SOLN
2.0000 g | Freq: Once | INTRAVENOUS | Status: AC
  Administered 2017-08-02: 2 g via INTRAVENOUS

## 2017-08-02 ENCOUNTER — Encounter
Admission: RE | Disposition: A | Discharge status: Home / Self Care | Payer: Self-pay | Source: Ambulatory Visit | Attending: Urology

## 2017-08-02 ENCOUNTER — Ambulatory Visit: Payer: Medicare Other | Admitting: Anesthesiology

## 2017-08-02 ENCOUNTER — Other Ambulatory Visit: Payer: Self-pay

## 2017-08-02 ENCOUNTER — Ambulatory Visit
Admission: RE | Admit: 2017-08-02 | Discharge: 2017-08-02 | Disposition: A | Discharge status: Home / Self Care | Payer: Medicare Other | Source: Ambulatory Visit | Attending: Urology | Admitting: Urology

## 2017-08-02 DIAGNOSIS — I509 Heart failure, unspecified: Secondary | ICD-10-CM | POA: Insufficient documentation

## 2017-08-02 DIAGNOSIS — G309 Alzheimer's disease, unspecified: Secondary | ICD-10-CM | POA: Diagnosis not present

## 2017-08-02 DIAGNOSIS — E669 Obesity, unspecified: Secondary | ICD-10-CM | POA: Insufficient documentation

## 2017-08-02 DIAGNOSIS — E119 Type 2 diabetes mellitus without complications: Secondary | ICD-10-CM | POA: Insufficient documentation

## 2017-08-02 DIAGNOSIS — J449 Chronic obstructive pulmonary disease, unspecified: Secondary | ICD-10-CM | POA: Insufficient documentation

## 2017-08-02 DIAGNOSIS — E039 Hypothyroidism, unspecified: Secondary | ICD-10-CM | POA: Diagnosis not present

## 2017-08-02 DIAGNOSIS — G4730 Sleep apnea, unspecified: Secondary | ICD-10-CM | POA: Diagnosis not present

## 2017-08-02 DIAGNOSIS — N135 Crossing vessel and stricture of ureter without hydronephrosis: Secondary | ICD-10-CM | POA: Insufficient documentation

## 2017-08-02 DIAGNOSIS — Z886 Allergy status to analgesic agent status: Secondary | ICD-10-CM | POA: Insufficient documentation

## 2017-08-02 DIAGNOSIS — Z7951 Long term (current) use of inhaled steroids: Secondary | ICD-10-CM | POA: Insufficient documentation

## 2017-08-02 DIAGNOSIS — F419 Anxiety disorder, unspecified: Secondary | ICD-10-CM | POA: Diagnosis not present

## 2017-08-02 DIAGNOSIS — Z79899 Other long term (current) drug therapy: Secondary | ICD-10-CM | POA: Diagnosis not present

## 2017-08-02 DIAGNOSIS — R911 Solitary pulmonary nodule: Secondary | ICD-10-CM | POA: Diagnosis not present

## 2017-08-02 DIAGNOSIS — K219 Gastro-esophageal reflux disease without esophagitis: Secondary | ICD-10-CM | POA: Insufficient documentation

## 2017-08-02 DIAGNOSIS — I110 Hypertensive heart disease with heart failure: Secondary | ICD-10-CM | POA: Diagnosis not present

## 2017-08-02 DIAGNOSIS — Z841 Family history of disorders of kidney and ureter: Secondary | ICD-10-CM | POA: Insufficient documentation

## 2017-08-02 DIAGNOSIS — Z794 Long term (current) use of insulin: Secondary | ICD-10-CM | POA: Insufficient documentation

## 2017-08-02 DIAGNOSIS — E785 Hyperlipidemia, unspecified: Secondary | ICD-10-CM | POA: Insufficient documentation

## 2017-08-02 DIAGNOSIS — M109 Gout, unspecified: Secondary | ICD-10-CM | POA: Insufficient documentation

## 2017-08-02 DIAGNOSIS — N131 Hydronephrosis with ureteral stricture, not elsewhere classified: Secondary | ICD-10-CM | POA: Insufficient documentation

## 2017-08-02 DIAGNOSIS — Z96653 Presence of artificial knee joint, bilateral: Secondary | ICD-10-CM | POA: Diagnosis not present

## 2017-08-02 DIAGNOSIS — Z833 Family history of diabetes mellitus: Secondary | ICD-10-CM | POA: Insufficient documentation

## 2017-08-02 DIAGNOSIS — M1990 Unspecified osteoarthritis, unspecified site: Secondary | ICD-10-CM | POA: Insufficient documentation

## 2017-08-02 DIAGNOSIS — Z7982 Long term (current) use of aspirin: Secondary | ICD-10-CM | POA: Diagnosis not present

## 2017-08-02 DIAGNOSIS — N130 Hydronephrosis with ureteropelvic junction obstruction: Secondary | ICD-10-CM | POA: Diagnosis not present

## 2017-08-02 DIAGNOSIS — Q6239 Other obstructive defects of renal pelvis and ureter: Secondary | ICD-10-CM

## 2017-08-02 DIAGNOSIS — F0280 Dementia in other diseases classified elsewhere without behavioral disturbance: Secondary | ICD-10-CM | POA: Diagnosis not present

## 2017-08-02 DIAGNOSIS — Z803 Family history of malignant neoplasm of breast: Secondary | ICD-10-CM | POA: Insufficient documentation

## 2017-08-02 DIAGNOSIS — Z6836 Body mass index (BMI) 36.0-36.9, adult: Secondary | ICD-10-CM | POA: Insufficient documentation

## 2017-08-02 DIAGNOSIS — Z800 Family history of malignant neoplasm of digestive organs: Secondary | ICD-10-CM | POA: Insufficient documentation

## 2017-08-02 DIAGNOSIS — Z90710 Acquired absence of both cervix and uterus: Secondary | ICD-10-CM | POA: Insufficient documentation

## 2017-08-02 DIAGNOSIS — F329 Major depressive disorder, single episode, unspecified: Secondary | ICD-10-CM | POA: Diagnosis not present

## 2017-08-02 DIAGNOSIS — Q6211 Congenital occlusion of ureteropelvic junction: Secondary | ICD-10-CM

## 2017-08-02 HISTORY — PX: CYSTOSCOPY W/ URETERAL STENT PLACEMENT: SHX1429

## 2017-08-02 LAB — GLUCOSE, CAPILLARY
GLUCOSE-CAPILLARY: 219 mg/dL — AB (ref 65–99)
GLUCOSE-CAPILLARY: 224 mg/dL — AB (ref 65–99)

## 2017-08-02 SURGERY — CYSTOSCOPY, FLEXIBLE, WITH STENT REPLACEMENT
Anesthesia: General | Site: Ureter | Laterality: Right | Wound class: Clean Contaminated

## 2017-08-02 MED ORDER — ONDANSETRON HCL 4 MG/2ML IJ SOLN: 4.0000 mg | Freq: Once | INTRAMUSCULAR | Status: DC | PRN

## 2017-08-02 MED ORDER — FENTANYL CITRATE (PF) 100 MCG/2ML IJ SOLN: 25.0000 ug | INTRAMUSCULAR | Status: DC | PRN

## 2017-08-02 MED ORDER — PROPOFOL 10 MG/ML IV BOLUS
INTRAVENOUS | Status: DC | PRN
  Administered 2017-08-02: 100 mg via INTRAVENOUS

## 2017-08-02 MED ORDER — GLYCOPYRROLATE 0.2 MG/ML IJ SOLN
INTRAMUSCULAR | Status: DC | PRN
  Administered 2017-08-02: 0.2 mg via INTRAVENOUS

## 2017-08-02 MED ORDER — FENTANYL CITRATE (PF) 100 MCG/2ML IJ SOLN
INTRAMUSCULAR | Status: AC
  Filled 2017-08-02: qty 2

## 2017-08-02 MED ORDER — HYDROCODONE-ACETAMINOPHEN 5-325 MG PO TABS: 1.0000 | ORAL_TABLET | ORAL | 0 refills | Status: DC | PRN

## 2017-08-02 MED ORDER — ONDANSETRON HCL 4 MG/2ML IJ SOLN
INTRAMUSCULAR | Status: AC
  Filled 2017-08-02: qty 2

## 2017-08-02 MED ORDER — CEFAZOLIN SODIUM-DEXTROSE 2-4 GM/100ML-% IV SOLN
INTRAVENOUS | Status: AC
  Filled 2017-08-02: qty 100

## 2017-08-02 MED ORDER — SODIUM CHLORIDE 0.9 % IV SOLN
INTRAVENOUS | Status: DC
  Administered 2017-08-02: 06:00:00 via INTRAVENOUS

## 2017-08-02 MED ORDER — LIDOCAINE HCL (PF) 2 % IJ SOLN
INTRAMUSCULAR | Status: AC
  Filled 2017-08-02: qty 10

## 2017-08-02 MED ORDER — CEPHALEXIN 500 MG PO CAPS: 500.0000 mg | ORAL_CAPSULE | Freq: Three times a day (TID) | ORAL | 0 refills | Status: DC

## 2017-08-02 MED ORDER — ONDANSETRON HCL 4 MG/2ML IJ SOLN
INTRAMUSCULAR | Status: DC | PRN
  Administered 2017-08-02: 4 mg via INTRAVENOUS

## 2017-08-02 MED ORDER — MIDAZOLAM HCL 2 MG/2ML IJ SOLN
INTRAMUSCULAR | Status: AC
  Filled 2017-08-02: qty 2

## 2017-08-02 MED ORDER — PROPOFOL 10 MG/ML IV BOLUS
INTRAVENOUS | Status: AC
  Filled 2017-08-02: qty 20

## 2017-08-02 MED ORDER — MIDAZOLAM HCL 2 MG/2ML IJ SOLN
INTRAMUSCULAR | Status: DC | PRN
  Administered 2017-08-02: 1 mg via INTRAVENOUS

## 2017-08-02 MED ORDER — FENTANYL CITRATE (PF) 100 MCG/2ML IJ SOLN
INTRAMUSCULAR | Status: DC | PRN
  Administered 2017-08-02 (×2): 25 ug via INTRAVENOUS

## 2017-08-02 SURGICAL SUPPLY — 20 items
BACTOSHIELD CHG 4% 4OZ (MISCELLANEOUS) ×1
GLOVE BIO SURGEON STRL SZ7.5 (GLOVE) ×3 IMPLANT
GOWN STRL REUS W/ TWL LRG LVL4 (GOWN DISPOSABLE) ×1 IMPLANT
GOWN STRL REUS W/TWL LRG LVL4 (GOWN DISPOSABLE) ×3
GOWN STRL REUS W/TWL XL LVL4 (GOWN DISPOSABLE) ×3 IMPLANT
INTRODUCER DILATOR DOUBLE (INTRODUCER) ×3 IMPLANT
KIT RM TURNOVER CYSTO AR (KITS) ×3 IMPLANT
PACK CYSTO AR (MISCELLANEOUS) ×3 IMPLANT
SCRUB CHG 4% DYNA-HEX 4OZ (MISCELLANEOUS) ×2 IMPLANT
SENSORWIRE 0.038 NOT ANGLED (WIRE) ×3
SET CYSTO W/LG BORE CLAMP LF (SET/KITS/TRAYS/PACK) ×3 IMPLANT
SOL .9 NS 3000ML IRR  AL (IV SOLUTION) ×2
SOL .9 NS 3000ML IRR AL (IV SOLUTION) ×1
SOL .9 NS 3000ML IRR UROMATIC (IV SOLUTION) ×1 IMPLANT
STENT URET 6FRX24 CONTOUR (STENTS) IMPLANT
STENT URET 6FRX26 CONTOUR (STENTS) IMPLANT
STENT URO INLAY 6FRX24CM (STENTS) ×3 IMPLANT
SURGILUBE 2OZ TUBE FLIPTOP (MISCELLANEOUS) ×3 IMPLANT
WATER STERILE IRR 1000ML POUR (IV SOLUTION) ×3 IMPLANT
WIRE SENSOR 0.038 NOT ANGLED (WIRE) ×1 IMPLANT

## 2017-08-02 NOTE — Transfer of Care (Signed)
Immediate Anesthesia Transfer of Care Note  Patient: [place]  Procedure(s) Performed: CYSTOSCOPY WITH STENT REPLACEMENT (Right Ureter)  Patient Location: PACU  Anesthesia Type:General  Level of Consciousness: awake, oriented and drowsy  Airway & Oxygen Therapy: Patient Spontanous Breathing and Patient connected to face mask oxygen  Post-op Assessment: Report given to RN and Post -op Vital signs reviewed and stable  Post vital signs: Reviewed and stable  Last Vitals:  Vitals:   08/02/17 0557 08/02/17 0811  BP:  (!) (P) 160/68  Pulse: 85 (!) (P) 114  Resp: 16   Temp: 36.5 C (P) 36.7 C  SpO2: 96% (P) 98%    Last Pain:  Vitals:   08/02/17 0557  TempSrc: Temporal         Complications: No apparent anesthesia complications

## 2017-08-02 NOTE — H&P (Signed)
Lori Blanchard 1945/09/02 [id number]  Referring provider: [name], MD [facility] [place], [place] [address]                         Chief Complaint Patient presents with .           Follow-up                       Ureteropelvic junction (UPJ) obstruction   HPI: The patient is a 71 year old female with a chronic right symptomatic UPJ obstruction that is treated with an indwelling ureteral stent exchanged every 2 months 2/2 calcification causing symptomatic obstruction of her stent if exchanges are less frequent. She is not a candidate for pyeloplasty due to her multiple medical co-morbidities. She presents today for right ureteral stent exchange.   Her last stent exchange, a Bard Optima stent was placed with the hope that this may not calcified as quickly.  We will assess calcification of the time exchange today.   if it is not as calcified as stents typically are, we may increase interval of stent exchange.  She had a 6 mm nonobstructing left upper pole stone that was removed approximately one year ago. It was 77% calcium oxalate monohydrate, 3% calcium phosphate, and 20% uric acid.    PMH:                        Past Medical History: Diagnosis        Date .           Alzheimer disease       .           Anxiety and depression           .           Asthma             .           Diabetes ([place])           .           Family history of adverse reaction to anesthesia                   daughter has breathing problems after anesthesia .           Gout     .           Hydronephrosis            .           Hyperlipemia    .           Hypertension    .           Hypothyroidism            .           Osteoarthritis    .           Osteoarthritis    .           Pulmonary nodule        .           Retroperitoneal fibrosis            .           Sleep apnea       Surgical History:  Past Surgical History: Procedure       Laterality         Date .           ABDOMINAL HYSTERECTOMY                              partial .           BREAST SURGERY   Bilateral           1990            Reduction .           CARPAL TUNNEL RELEASE                        .           CESAREAN SECTION                                  x 3 .           CYSTOSCOPY W/ RETROGRADES            Right    11/02/2015            Procedure: CYSTOSCOPY WITH RETROGRADE PYELOGRAM, URETERAL STENT EXCHANGE; Surgeon: [name], MD; Location: [facility] ORS; Service: Urology; Laterality: Right; .           CYSTOSCOPY W/ RETROGRADES            Right    01/31/2016            Procedure: CYSTOSCOPY WITH RETROGRADE PYELOGRAM; Surgeon: [name], MD; Location: [facility] ORS; Service: Urology; Laterality: Right; .           CYSTOSCOPY W/ URETERAL STENT PLACEMENT        Right    05/11/2015            Procedure: CYSTOSCOPY WITH STENT REPLACEMENT; Surgeon: [name], MD; Location: [facility] ORS; Service: Urology; Laterality: Right; .           CYSTOSCOPY W/ URETERAL STENT PLACEMENT        Right    01/31/2016            Procedure: CYSTOSCOPY, RETROGRADE PYELOGRAMS WITH STENT REPLACEMENT; Surgeon: [name], MD; Location: [facility] ORS; Service: Urology; Laterality: Right; .           HERNIA REPAIR                               umbilical .           JOINT REPLACEMENT         Left      2013            TKR .           JOINT REPLACEMENT         Right    2011            TKR .           LAPAROSCOPIC HYSTERECTOMY                        .           REPLACEMENT TOTAL KNEE  Bilateral              Home Medications:                          Medication List                                      Accurate as of 02/29/16 10:15 AM.Always use your most recent med list.                albuterol(2.5 MG/3ML) 0.083% nebulizer solution Commonly known as:  PROVENTIL Inhale 2.5 mg into the lungs every 4 (four) hours as needed for wheezing or shortness of breath. PROAIR HFA108 (90 Base) MCG/ACT inhaler Generic drug: albuterol USE 2 PUFFS EVERY FOUR HOURS AS NEEDED. alendronate70 MG tablet Commonly known as: FOSAMAX TAKE ONE TABLET EVERY WEEK. TAKE WITH A FUUL GLASS OF WATER AND DO NOT LIE DOWN FOR THE NEXT 30 MINUTES. usually on Friday or Saturday. aspirin EC81 MG tablet Take 81 mg by mouth every morning. atorvastatin40 MG tablet Commonly known as: LIPITOR Take 40 mg by mouth every morning. buPROPion150 MG 24 hr tablet Commonly known as: WELLBUTRIN XL Take 150 mg by mouth every morning. cyclobenzaprine5 MG tablet Commonly known as: FLEXERIL Take 1 tablet (5 mg total) by mouth every 8 (eight) hours as needed for muscle spasms. donepezil5 MG tablet Commonly known as: ARICEPT Take 5 mg by mouth daily. fenofibrate145 MG tablet Commonly known as: TRICOR TAKE ONE (1) TABLET EACH DAY in am furosemide20 MG tablet Commonly known as: LASIX Take 20 mg by mouth every morning. HYDROcodone-acetaminophen5-325 MG tablet Commonly known as: NORCO Take 1 tablet by mouth every 6 (six) hours as needed for moderate pain. insulin lispro100 UNIT/ML KiwkPen Commonly known as: HUMALOG Inject 10 Units into the skin 3 (three) times daily. LANTUS SOLOSTAR100 UNIT/ML Solostar Pen Generic drug: Insulin Glargine Inject 20 Units into the skin daily at 10 pm. levothyroxine50 MCG tablet Commonly known as: SYNTHROID, LEVOTHROID TAKE 1 TABLET ON AN EMPTY STOMACH AT LEAST 30-60 MINUTES BEFORE BREAKFAST metFORMIN1000 MG tablet Commonly known as: GLUCOPHAGE twice a day pantoprazole40 MG tablet Commonly known as: PROTONIX Take 40 mg by mouth every morning. PEN NEEDLES 31GX5/16"31G X 8 MM Misc Inject into the skin. SURE COMFORT PEN NEEDLES31G X 5 MM Misc Generic drug: Insulin Pen Needle USE 4 TIMES A DAY OR AS DIRECTED BY  DOCTOR RA VITAMIN B-12 TR1000 MCG Tbcr Generic drug: Cyanocobalamin Take 1 tablet by mouth daily. senna8.6 MG tablet Commonly known as: SENOKOT Take 1 tablet by mouth 2 (two) times daily. At 8 am and 5 pm telmisartan40 MG tablet Commonly known as: MICARDIS TAKE ONE (1) TABLET EACH DAY in am vitamin E1000 UNIT capsule Take 1,000 Units by mouth daily. vitamin E400 UNIT capsule Take 400 Units by mouth every morning.    Allergies:                         Allergies Allergen           Reactions .           Ibuprofen         Itching, Nausea Only and Other (See Comments)   Family History:  Family History Problem           Relation           Age of Onset .           Liver cancer    Mother  .           Colon cancer   Mother  .           Breast cancer  Mother  .           Diabetes          Daughter          .           Kidney disease           Daughter                                adrenal tumors .           Kidney cancer Neg Hx             .           Prostate cancer           Neg Hx               Social History:reports that she has never smoked. She has never used smokeless tobacco. She reports that she does not drink alcohol or use drugs.  ROS: UROLOGY Frequent Urination?: No Hard to postpone urination?: No Burning/pain with urination?: No Get up at night to urinate?: Yes Leakage of urine?: No Urine stream starts and stops?: No Trouble starting stream?: No Do you have to strain to urinate?: No Blood in urine?: No Urinary tract infection?: No Sexually transmitted disease?: No Injury to kidneys or bladder?: No Painful intercourse?: No Weak stream?: No Currently pregnant?: No Vaginal bleeding?: No Last menstrual period?: No  Gastrointestinal Nausea?: No Vomiting?: No Indigestion/heartburn?: No Diarrhea?: No Constipation?: No  Constitutional Fever: No Night sweats?: No Weight  loss?: No Fatigue?: No  Skin Skin rash/lesions?: No Itching?: No  Eyes Blurred vision?: No Double vision?: No  Ears/Nose/Throat Sore throat?: No Sinus problems?: No  Hematologic/Lymphatic Swollen glands?: No Easy bruising?: No  Cardiovascular Leg swelling?: Yes Chest pain?: No  Respiratory Cough?: No Shortness of breath?: Yes  Endocrine Excessive thirst?: No  Musculoskeletal Back pain?: No Joint pain?: Yes  Neurological Headaches?: Yes Dizziness?: No  Psychologic Depression?: Yes Anxiety?: Yes  Physical Exam: BP (!) 149/72 (BP Location: Left Arm, Patient Position: Sitting, Cuff Size: Normal)  Pulse 90  Ht 4\' 8"  (1.422 m)  Wt 160 lb 11.2 oz (72.9 kg)  BMI 36.03 kg/m  Constitutional: Alert and oriented, No acute distress. HEENT: [place] AT, moist mucus membranes. Trachea midline, no masses. Cardiovascular: No clubbing, cyanosis, or edema. RRR. Respiratory:Normal respiratory effort,no increased work of breathing. Lungs clear. GI: Abdomen is soft, nontender, nondistended, no abdominal masses GU: No CVA tenderness.  Skin: No rashes, bruises or suspicious lesions. Lymph: No cervical or inguinal adenopathy. Neurologic: Grossly intact, no focal deficits, moving all 4 extremities. Psychiatric: Normal mood and affect.  Laboratory Data: RecentLabs                                   Lab Results Component     Value   Date  WBC    7.2       12/29/2015            HGB    10.5 (L)            12/29/2015            HCT     33.2 (L)            12/29/2015            MCV    74.1 (L)            12/29/2015            PLT      286      12/29/2015    RecentLabs                                   Lab Results Component     Value   Date            CREATININE  0.90     12/29/2015    RecentLabs No results found for: PSA   RecentLabs No results found for: TESTOSTERONE   RecentLabs No results found for:  HGBA1C   Urinalysis Labs(Brief)                                    Component     Value   Date/Time            COLORURINE            YELLOW (A)   01/18/2016 1106            APPEARANCEUR      Clear    01/23/2016 0933            LABSPEC       1.016   01/18/2016 1106            LABSPEC       1.015   08/08/2011 1106            PHURINE        6.0       01/18/2016 1106            GLUCOSEU    Negative          01/23/2016 0933            GLUCOSEU    Negative          08/08/2011 1106            HGBUR           1+ (A)  01/18/2016 1106            BILIRUBINUR Negative          01/23/2016 0933            BILIRUBINUR Negative          08/08/2011 1106            KETONESUR  NEGATIVE      01/18/2016 1106            PROTEINUR   Negative          01/23/2016 0933            PROTEINUR   NEGATIVE      01/18/2016 1106  NITRITE          Negative          01/23/2016 0933            NITRITE          NEGATIVE      01/18/2016 1106            LEUKOCYTESUR      Trace (A)         01/23/2016 0933            LEUKOCYTESUR      Negative          08/08/2011 1106    Assessment &Plan:   1. Right UPJ obstruction -Right stent exchange today

## 2017-08-02 NOTE — Op Note (Signed)
Date of procedure: 08/02/17  Preoperative diagnosis:  1. Right UPJ obstruction  Postoperative diagnosis:  1. Right UPJ obstruction  Procedure: 1. Cystoscopy 2. Right retrograde pyelogram with interpretation 3. Right ureteral stent exchange 6 [place] by 24 cm - Bard optima  Surgeon: Baruch Gouty, MD  Anesthesia: General  Complications: None  Intraoperative findings: The patient had her previously known right UPJ obstruction was confirmed with a right retrograde pyelogram used to identify the collecting system. She underwent a successful stent exchange.   Of note, her stent had no calcifications on it at the time of exchange.  EBL: None  Specimens: None  Drains: Right 6 French by 24 cm double-J ureteral stent - Bard Optima  Disposition: Stable to the postanesthesia care unit  Indicationfor procedure: The patient is a71 y.o.femalewith a right UPJ obstruction that she is symptomatic from. She is a poor surgical candidate andhas been temporized with a right ureteral stent. She does have issues with calcifying her stent unusually fast resulting in right flank pain. She presents today for her two-month stent exchange as she could noy tolerate a longer interval between exchanges.  At her last exchange, she was fitted with a Bard Optima stent for the first time to see if the interval exchange could be increased with a different stent type.  She has not tolerated metal stents previously. After reviewing the management options for treatment, the patient elected to proceed with the above surgical procedure(s). We have discussed the potential benefits and risks of the procedure, side effects of the proposed treatment, the likelihood of the patient achieving the goals of the procedure, and any potential problems that might occur during the procedure or recuperation. Informed consent has been obtained.  Description of procedure: The patient was met in the preoperative area.  All risks, benefits, and indications of the procedure were described in great detail. The patient consented to the procedure. Preoperative antibiotics were given. The patient was taken to the operative theater. General anesthesia was induced per the anesthesia service. The patient was then placed in the dorsal lithotomy position and prepped and draped in the usual sterile fashion. A preoperative timeout was called.   A 21 French 30 cystoscope was inserted into the patient's bladder per urethra atraumatically. The right ureteral stent was grasped flexible graspers and brought to level the urethral meatus. A sensor wire was exchanged through the right ureteral stent to level of the renal pelvis and the stent was removed. With the aid of a dual-lumen catheter, right retrograde pyelogram was obtained which again was used to identify the collecting system and showed her UPJ obstruction. Over the sensor wire with the aid of the cystoscope was 6 [place] by 24 cm Bard Optima double-J ureteral stent was then placed. The sensor wire was removed. A curl was seen in the patient's right renal pelvis on fluoroscopy and the urinary bladder under direct visualization. At this point the patient's bladder was drained, and she was woken from anesthesia and transferred stable to the postanesthesia care unit.  Of note, when the Bard Optima stent was removed there was no calcification on the stent.  Plan: The patient will follow-up in 3 months for repeat stent exchange.  This will increase her interval from her typical 2 months.  Hopefully with the Bard Optima stent in place, she will continue to be able to increase her interval of stent exchange.  She will contact the office if she becomes symptomatic from her right UPJ obstruction and a calcified stent  sooner.

## 2017-08-02 NOTE — Anesthesia Preprocedure Evaluation (Addendum)
Anesthesia Evaluation  Patient identified by MRN, date of birth, ID band Patient awake    Reviewed: Allergy & Precautions, NPO status , Patient's Chart, lab work & pertinent test results, reviewed documented beta blocker date and time   History of Anesthesia Complications (+) Family history of anesthesia reaction  Airway Mallampati: III  TM Distance: >3 FB     Dental  (+) Chipped, Missing, Poor Dentition   Pulmonary shortness of breath, asthma , sleep apnea , COPD,           Cardiovascular hypertension, Pt. on medications +CHF       Neuro/Psych PSYCHIATRIC DISORDERS Anxiety Depression    GI/Hepatic GERD  Controlled,  Endo/Other  diabetes, Type 2Hypothyroidism   Renal/GU Renal disease     Musculoskeletal  (+) Arthritis ,   Abdominal   Peds  Hematology   Anesthesia Other Findings Obese.gout.  Reproductive/Obstetrics                            Anesthesia Physical Anesthesia Plan  ASA: III  Anesthesia Plan: General   Post-op Pain Management:    Induction: Intravenous  PONV Risk Score and Plan:   Airway Management Planned: LMA  Additional Equipment:   Intra-op Plan:   Post-operative Plan:   Informed Consent: I have reviewed the patients History and Physical, chart, labs and discussed the procedure including the risks, benefits and alternatives for the proposed anesthesia with the patient or authorized representative who has indicated his/her understanding and acceptance.     Plan Discussed with: CRNA  Anesthesia Plan Comments:         Anesthesia Quick Evaluation

## 2017-08-02 NOTE — Discharge Instructions (Signed)

## 2017-08-02 NOTE — Anesthesia Post-op Follow-up Note (Signed)
Anesthesia QCDR form completed.        

## 2017-08-02 NOTE — Anesthesia Postprocedure Evaluation (Signed)
Anesthesia Post Note  Patient: Lori Blanchard  Procedure(s) Performed: CYSTOSCOPY WITH STENT REPLACEMENT (Right Ureter)  Patient location during evaluation: PACU Anesthesia Type: General Level of consciousness: awake and alert Pain management: pain level controlled Vital Signs Assessment: post-procedure vital signs reviewed and stable Respiratory status: spontaneous breathing, nonlabored ventilation, respiratory function stable and patient connected to nasal cannula oxygen Cardiovascular status: blood pressure returned to baseline and stable Postop Assessment: no apparent nausea or vomiting Anesthetic complications: no     Last Vitals:  Vitals:   08/02/17 0854 08/02/17 0900  BP: (!) 146/66 (!) 145/63  Pulse: (!) 103 97  Resp: 19 20  Temp: 37.1 C   SpO2: 92% 93%    Last Pain:  Vitals:   08/02/17 0854  TempSrc:   PainSc: 0-No pain                 Zury Fazzino S

## 2017-08-03 ENCOUNTER — Encounter: Payer: Self-pay | Admitting: Urology

## 2017-08-07 ENCOUNTER — Ambulatory Visit
Admission: RE | Admit: 2017-08-07 | Discharge: 2017-08-07 | Disposition: A | Discharge status: Home / Self Care | Payer: Medicare Other | Source: Ambulatory Visit | Attending: Specialist | Admitting: Specialist

## 2017-08-07 DIAGNOSIS — I700 Atherosclerosis of aorta: Secondary | ICD-10-CM | POA: Diagnosis not present

## 2017-08-07 DIAGNOSIS — R0602 Shortness of breath: Secondary | ICD-10-CM | POA: Insufficient documentation

## 2017-08-07 DIAGNOSIS — I2510 Atherosclerotic heart disease of native coronary artery without angina pectoris: Secondary | ICD-10-CM | POA: Diagnosis not present

## 2017-08-07 DIAGNOSIS — R05 Cough: Secondary | ICD-10-CM | POA: Insufficient documentation

## 2017-08-07 DIAGNOSIS — R918 Other nonspecific abnormal finding of lung field: Secondary | ICD-10-CM | POA: Diagnosis not present

## 2017-08-07 DIAGNOSIS — R053 Chronic cough: Secondary | ICD-10-CM

## 2017-08-07 DIAGNOSIS — R590 Localized enlarged lymph nodes: Secondary | ICD-10-CM | POA: Insufficient documentation

## 2017-08-07 DIAGNOSIS — J8489 Other specified interstitial pulmonary diseases: Secondary | ICD-10-CM

## 2017-10-09 ENCOUNTER — Other Ambulatory Visit: Payer: Self-pay | Admitting: Radiology

## 2017-10-09 ENCOUNTER — Encounter: Payer: Self-pay | Admitting: Urology

## 2017-10-09 ENCOUNTER — Ambulatory Visit: Payer: Medicare Other | Admitting: Urology

## 2017-10-09 VITALS — BP 175/76 | HR 98 | Ht <= 58 in | Wt 162.6 lb

## 2017-10-09 DIAGNOSIS — N135 Crossing vessel and stricture of ureter without hydronephrosis: Secondary | ICD-10-CM

## 2017-10-09 NOTE — H&P (View-Only) (Signed)
10/09/2017 2:15 PM   Lori Blanchard 14-Nov-1945 [id number]  Referring provider: [name], MD [facility] [place], [place] [address]  Chief Complaint  Patient presents with  . Follow-up    HPI: 72 year old female with chronic symptomatic right UPJ obstruction currently managed with an indwelling ureteral stent.  She has previously seen Dr. Pilar Jarvis.  Her stent was requiring every 10-month exchanges due to encrustation.  She is not a surgical candidate for pyeloplasty due to multiple medical comorbidities.  Her last stent exchange was 08/02/2017.  A Bard Optima stent was placed and no calcifications were noted.  Over the last few weeks she has had some increased frequency, urgency with urge incontinence.  She has mild flank pain.   PMH: Past Medical History:  Diagnosis Date  . Alzheimer disease   . Alzheimer disease   . Anxiety   . Anxiety and depression   . Asthma   . Cancer ([place])    Basal Cell Skin Cancer  . CHF (congestive heart failure) ([place])   . COPD (chronic obstructive pulmonary disease) ([place])   . Depression   . Diabetes ([place])   . Diverticulosis   . Dyspnea   . Elevated lipids   . Family history of adverse reaction to anesthesia    daughter has breathing problems after anesthesia  . Fatty liver   . GERD (gastroesophageal reflux disease)   . Gout   . History of kidney stones   . Hydronephrosis   . Hyperlipemia   . Hypertension   . Hypothyroidism   . Lower extremity edema   . Osteoarthritis   . Osteoarthritis   . Osteoporosis   . Pulmonary nodule   . Retroperitoneal fibrosis   . Sleep apnea   . Ureter injury     Surgical History: Past Surgical History:  Procedure Laterality Date  . ABDOMINAL HYSTERECTOMY     partial  . ANKLE FRACTURE SURGERY Left   . APPENDECTOMY  1965  . BREAST SURGERY Bilateral 1990   Reduction  . c setion    . CARPAL TUNNEL RELEASE Bilateral   . CESAREAN SECTION     x 3  . CYSTOSCOPY W/  RETROGRADES Right 11/02/2015   Procedure: CYSTOSCOPY WITH RETROGRADE PYELOGRAM, URETERAL STENT EXCHANGE;  Surgeon: [name], MD;  Location: [facility];  Service: Urology;  Laterality: Right;  . CYSTOSCOPY W/ RETROGRADES Right 01/31/2016   Procedure: CYSTOSCOPY WITH RETROGRADE PYELOGRAM;  Surgeon: [name], MD;  Location: [facility];  Service: Urology;  Laterality: Right;  . CYSTOSCOPY W/ RETROGRADES Bilateral 05/09/2016   Procedure: CYSTOSCOPY WITH RETROGRADE PYELOGRAM;  Surgeon: [name], MD;  Location: [facility];  Service: Urology;  Laterality: Bilateral;  . CYSTOSCOPY W/ RETROGRADES Left 11/21/2016   Procedure: CYSTOSCOPY WITH RETROGRADE PYELOGRAM;  Surgeon: [name], MD;  Location: [facility];  Service: Urology;  Laterality: Left;  . CYSTOSCOPY W/ RETROGRADES Right 03/27/2017   Procedure: CYSTOSCOPY WITH RETROGRADE PYELOGRAM;  Surgeon: [name], MD;  Location: [facility];  Service: Urology;  Laterality: Right;  . CYSTOSCOPY W/ RETROGRADES Bilateral 05/31/2017   Procedure: CYSTOSCOPY WITH RETROGRADE PYELOGRAM;  Surgeon: [name], MD;  Location: [facility];  Service: Urology;  Laterality: Bilateral;  . CYSTOSCOPY W/ URETERAL STENT PLACEMENT Right 05/11/2015   Procedure: CYSTOSCOPY WITH STENT REPLACEMENT;  Surgeon: [name], MD;  Location: [facility];  Service: Urology;  Laterality: Right;  . CYSTOSCOPY W/ URETERAL STENT PLACEMENT Right 01/31/2016  Procedure: CYSTOSCOPY, RETROGRADE PYELOGRAMS WITH STENT REPLACEMENT;  Surgeon: [name], MD;  Location: [facility];  Service: Urology;  Laterality: Right;  . CYSTOSCOPY W/ URETERAL STENT PLACEMENT Right 05/09/2016   Procedure: CYSTOSCOPY WITH STENT REPLACEMENT;  Surgeon: [name], MD;  Location: [facility];  Service: Urology;  Laterality: Right;  . CYSTOSCOPY W/ URETERAL STENT PLACEMENT Bilateral 07/04/2016   Procedure: CYSTOSCOPY WITH STENT REPLACEMENT;  Surgeon: [name],  MD;  Location: [facility];  Service: Urology;  Laterality: Bilateral;  . CYSTOSCOPY W/ URETERAL STENT PLACEMENT Right 09/14/2016   Procedure: CYSTOSCOPY WITH STENT REPLACEMENT;  Surgeon: [name], MD;  Location: [facility];  Service: Urology;  Laterality: Right;  . CYSTOSCOPY W/ URETERAL STENT PLACEMENT Right 11/21/2016   Procedure: CYSTOSCOPY WITH STENT REPLACEMENT;  Surgeon: [name], MD;  Location: [facility];  Service: Urology;  Laterality: Right;  . CYSTOSCOPY W/ URETERAL STENT PLACEMENT Right 01/25/2017   Procedure: CYSTOSCOPY WITH STENT REPLACEMENT;  Surgeon: [name], MD;  Location: [facility];  Service: Urology;  Laterality: Right;  . CYSTOSCOPY W/ URETERAL STENT PLACEMENT Right 03/27/2017   Procedure: CYSTOSCOPY WITH STENT REPLACEMENT;  Surgeon: [name], MD;  Location: [facility];  Service: Urology;  Laterality: Right;  . CYSTOSCOPY W/ URETERAL STENT PLACEMENT Right 05/31/2017   Procedure: CYSTOSCOPY WITH STENT REPLACEMENT;  Surgeon: [name], MD;  Location: [facility];  Service: Urology;  Laterality: Right;  . CYSTOSCOPY W/ URETERAL STENT PLACEMENT Right 08/02/2017   Procedure: CYSTOSCOPY WITH STENT REPLACEMENT;  Surgeon: [name], MD;  Location: [facility];  Service: Urology;  Laterality: Right;  . CYSTOSCOPY W/ URETERAL STENT REMOVAL Left 09/14/2016   Procedure: CYSTOSCOPY WITH STENT REMOVAL;  Surgeon: [name], MD;  Location: [facility];  Service: Urology;  Laterality: Left;  . CYSTOSCOPY WITH STENT PLACEMENT Left 05/09/2016   Procedure: CYSTOSCOPY WITH STENT PLACEMENT;  Surgeon: [name], MD;  Location: [facility];  Service: Urology;  Laterality: Left;  . EYE SURGERY Left 2018   tear duct reconstruction  . HERNIA REPAIR     umbilical  . JOINT REPLACEMENT Left 2013   TKR  . JOINT REPLACEMENT Right 2011   TKR  . LAPAROSCOPIC HYSTERECTOMY    . REDUCTION MAMMAPLASTY    . REPLACEMENT TOTAL KNEE Bilateral   . SKIN CANCER  EXCISION Left 2018   lower eye lid  . URETEROSCOPY Bilateral 05/09/2016   Procedure: URETEROSCOPY;  Surgeon: [name], MD;  Location: [facility];  Service: Urology;  Laterality: Bilateral;  . URETEROSCOPY WITH HOLMIUM LASER LITHOTRIPSY Left 07/04/2016   Procedure: URETEROSCOPY WITH HOLMIUM LASER LITHOTRIPSY;  Surgeon: [name], MD;  Location: [facility];  Service: Urology;  Laterality: Left;    Home Medications:  Allergies as of 10/09/2017      Reactions   Ibuprofen Itching, Nausea Only, Rash      Medication List        Accurate as of 10/09/17  2:15 PM. Always use your most recent med list.          albuterol 108 (90 Base) MCG/ACT inhaler Commonly known as:  PROVENTIL HFA;VENTOLIN HFA Inhale 2 puffs into the lungs every 4 (four) hours as needed for wheezing or shortness of breath.   albuterol (2.5 MG/3ML) 0.083% nebulizer solution Commonly known as:  PROVENTIL Inhale 2.5 mg into the lungs every 4 (four) hours as needed for wheezing or shortness of breath.   alendronate 70 MG tablet Commonly  known as:  FOSAMAX Take 70 mg by mouth once weekly on Friday   aspirin EC 81 MG tablet Take 81 mg by mouth daily.   atorvastatin 40 MG tablet Commonly known as:  LIPITOR Take 40 mg by mouth daily.   benzonatate 100 MG capsule Commonly known as:  TESSALON Take 100 mg by mouth 3 (three) times daily.   buPROPion 150 MG 24 hr tablet Commonly known as:  WELLBUTRIN XL Take 150 mg by mouth daily.   donepezil 5 MG tablet Commonly known as:  ARICEPT Take 5 mg by mouth daily.   furosemide 20 MG tablet Commonly known as:  LASIX Take 20 mg by mouth daily.   insulin lispro 100 UNIT/ML KiwkPen Commonly known as:  HUMALOG Inject 12 Units into the skin 3 (three) times daily before meals.   LANTUS SOLOSTAR 100 UNIT/ML Solostar Pen Generic drug:  Insulin Glargine Inject 40 Units into the skin daily at 10 pm.   levothyroxine 75 MCG tablet Commonly known as:  SYNTHROID,  LEVOTHROID Take 75 mcg by mouth daily before breakfast.   multivitamin with minerals Tabs tablet Take 1 tablet by mouth daily.   naproxen sodium 220 MG tablet Commonly known as:  ALEVE Take 220 mg by mouth 2 (two) times daily as needed (for pain or headache).   OXYGEN Inhale 2 Doses into the lungs daily as needed.   pantoprazole 40 MG tablet Commonly known as:  PROTONIX Take 40 mg by mouth daily.   telmisartan 40 MG tablet Commonly known as:  MICARDIS TAKE 1 TABLET (40 MG) BY MOUTH DAILY IN THE MORNING       Allergies:  Allergies  Allergen Reactions  . Ibuprofen Itching, Nausea Only and Rash    Family History: Family History  Problem Relation Age of Onset  . Liver cancer Mother   . Colon cancer Mother   . Breast cancer Mother 60  . Diabetes Daughter   . Kidney disease Daughter        adrenal tumors  . Kidney cancer Neg Hx   . Prostate cancer Neg Hx     Social History:  reports that  has never smoked. she has never used smokeless tobacco. She reports that she does not drink alcohol or use drugs.  ROS: UROLOGY Frequent Urination?: No Hard to postpone urination?: No Burning/pain with urination?: No Get up at night to urinate?: No Leakage of urine?: Yes Urine stream starts and stops?: Yes Trouble starting stream?: No Do you have to strain to urinate?: No Blood in urine?: No Urinary tract infection?: No Sexually transmitted disease?: No Injury to kidneys or bladder?: No Painful intercourse?: No Weak stream?: No Currently pregnant?: No Vaginal bleeding?: No Last menstrual period?: n  Gastrointestinal Nausea?: No Vomiting?: No Indigestion/heartburn?: No Diarrhea?: No Constipation?: No  Constitutional Fever: No Night sweats?: No Weight loss?: No Fatigue?: No  Skin Skin rash/lesions?: No Itching?: No  Eyes Blurred vision?: No Double vision?: No  Ears/Nose/Throat Sore throat?: No Sinus problems?: No  Hematologic/Lymphatic Swollen  glands?: No Easy bruising?: No  Cardiovascular Leg swelling?: Yes Chest pain?: No  Respiratory Cough?: Yes Shortness of breath?: Yes  Endocrine Excessive thirst?: Yes  Musculoskeletal Back pain?: No Joint pain?: Yes  Neurological Headaches?: Yes Dizziness?: Yes  Psychologic Depression?: Yes Anxiety?: Yes  Physical Exam: BP (!) 175/76 (BP Location: Right Arm, Patient Position: Sitting, Cuff Size: Normal)   Pulse 98   Ht 4\' 8"  (1.422 m)   Wt 162 lb 9.6 oz (73.8 kg)  BMI 36.45 kg/m   Constitutional:  Alert and oriented, No acute distress. HEENT: [place] AT, moist mucus membranes.  Trachea midline, no masses. Cardiovascular: No clubbing, cyanosis, or edema.  RRR Respiratory: Normal respiratory effort, no increased work of breathing.  Lungs clear GI: Abdomen is soft, nontender, nondistended, no abdominal masses GU: No CVA tenderness Lymph: No cervical or inguinal lymphadenopathy. Skin: No rashes, bruises or suspicious lesions. Neurologic: Grossly intact, no focal deficits, moving all 4 extremities. Psychiatric: Normal mood and affect.  Laboratory Data: Lab Results  Component Value Date   WBC 10.2 05/23/2017   HGB 10.4 (L) 05/23/2017   HCT 33.6 (L) 05/23/2017   MCV 71.8 (L) 05/23/2017   PLT 284 05/23/2017    Lab Results  Component Value Date   CREATININE 0.73 05/23/2017     Assessment & Plan:   72 year old female with symptomatic right UPJ obstruction.  Will schedule cystoscopy with stent exchange.  The procedure was again discussed in detail including potential risks of bleeding, infection, ureteral injury and anesthetic risk.  She was present with her daughter today.  All questions were answered and they desire to schedule.   Abbie Sons, [place] [address] [place], [place] [address] [phone]

## 2017-10-09 NOTE — Progress Notes (Signed)
10/09/2017 2:15 PM   Lori Blanchard 10-15-1945 [id number]  Referring provider: [name], MD [facility] [place], [place] [address]  Chief Complaint  Patient presents with  . Follow-up    HPI: 72 year old female with chronic symptomatic right UPJ obstruction currently managed with an indwelling ureteral stent.  She has previously seen Dr. Pilar Jarvis.  Her stent was requiring every 30-month exchanges due to encrustation.  She is not a surgical candidate for pyeloplasty due to multiple medical comorbidities.  Her last stent exchange was 08/02/2017.  A Bard Optima stent was placed and no calcifications were noted.  Over the last few weeks she has had some increased frequency, urgency with urge incontinence.  She has mild flank pain.   PMH: Past Medical History:  Diagnosis Date  . Alzheimer disease   . Alzheimer disease   . Anxiety   . Anxiety and depression   . Asthma   . Cancer ([place])    Basal Cell Skin Cancer  . CHF (congestive heart failure) ([place])   . COPD (chronic obstructive pulmonary disease) ([place])   . Depression   . Diabetes ([place])   . Diverticulosis   . Dyspnea   . Elevated lipids   . Family history of adverse reaction to anesthesia    daughter has breathing problems after anesthesia  . Fatty liver   . GERD (gastroesophageal reflux disease)   . Gout   . History of kidney stones   . Hydronephrosis   . Hyperlipemia   . Hypertension   . Hypothyroidism   . Lower extremity edema   . Osteoarthritis   . Osteoarthritis   . Osteoporosis   . Pulmonary nodule   . Retroperitoneal fibrosis   . Sleep apnea   . Ureter injury     Surgical History: Past Surgical History:  Procedure Laterality Date  . ABDOMINAL HYSTERECTOMY     partial  . ANKLE FRACTURE SURGERY Left   . APPENDECTOMY  1965  . BREAST SURGERY Bilateral 1990   Reduction  . c setion    . CARPAL TUNNEL RELEASE Bilateral   . CESAREAN SECTION     x 3  . CYSTOSCOPY W/  RETROGRADES Right 11/02/2015   Procedure: CYSTOSCOPY WITH RETROGRADE PYELOGRAM, URETERAL STENT EXCHANGE;  Surgeon: [name], MD;  Location: [facility];  Service: Urology;  Laterality: Right;  . CYSTOSCOPY W/ RETROGRADES Right 01/31/2016   Procedure: CYSTOSCOPY WITH RETROGRADE PYELOGRAM;  Surgeon: [name], MD;  Location: [facility];  Service: Urology;  Laterality: Right;  . CYSTOSCOPY W/ RETROGRADES Bilateral 05/09/2016   Procedure: CYSTOSCOPY WITH RETROGRADE PYELOGRAM;  Surgeon: [name], MD;  Location: [facility];  Service: Urology;  Laterality: Bilateral;  . CYSTOSCOPY W/ RETROGRADES Left 11/21/2016   Procedure: CYSTOSCOPY WITH RETROGRADE PYELOGRAM;  Surgeon: [name], MD;  Location: [facility];  Service: Urology;  Laterality: Left;  . CYSTOSCOPY W/ RETROGRADES Right 03/27/2017   Procedure: CYSTOSCOPY WITH RETROGRADE PYELOGRAM;  Surgeon: [name], MD;  Location: [facility];  Service: Urology;  Laterality: Right;  . CYSTOSCOPY W/ RETROGRADES Bilateral 05/31/2017   Procedure: CYSTOSCOPY WITH RETROGRADE PYELOGRAM;  Surgeon: [name], MD;  Location: [facility];  Service: Urology;  Laterality: Bilateral;  . CYSTOSCOPY W/ URETERAL STENT PLACEMENT Right 05/11/2015   Procedure: CYSTOSCOPY WITH STENT REPLACEMENT;  Surgeon: [name], MD;  Location: [facility];  Service: Urology;  Laterality: Right;  . CYSTOSCOPY W/ URETERAL STENT PLACEMENT Right 01/31/2016  Procedure: CYSTOSCOPY, RETROGRADE PYELOGRAMS WITH STENT REPLACEMENT;  Surgeon: [name], MD;  Location: [facility];  Service: Urology;  Laterality: Right;  . CYSTOSCOPY W/ URETERAL STENT PLACEMENT Right 05/09/2016   Procedure: CYSTOSCOPY WITH STENT REPLACEMENT;  Surgeon: [name], MD;  Location: [facility];  Service: Urology;  Laterality: Right;  . CYSTOSCOPY W/ URETERAL STENT PLACEMENT Bilateral 07/04/2016   Procedure: CYSTOSCOPY WITH STENT REPLACEMENT;  Surgeon: [name],  MD;  Location: [facility];  Service: Urology;  Laterality: Bilateral;  . CYSTOSCOPY W/ URETERAL STENT PLACEMENT Right 09/14/2016   Procedure: CYSTOSCOPY WITH STENT REPLACEMENT;  Surgeon: [name], MD;  Location: [facility];  Service: Urology;  Laterality: Right;  . CYSTOSCOPY W/ URETERAL STENT PLACEMENT Right 11/21/2016   Procedure: CYSTOSCOPY WITH STENT REPLACEMENT;  Surgeon: [name], MD;  Location: [facility];  Service: Urology;  Laterality: Right;  . CYSTOSCOPY W/ URETERAL STENT PLACEMENT Right 01/25/2017   Procedure: CYSTOSCOPY WITH STENT REPLACEMENT;  Surgeon: [name], MD;  Location: [facility];  Service: Urology;  Laterality: Right;  . CYSTOSCOPY W/ URETERAL STENT PLACEMENT Right 03/27/2017   Procedure: CYSTOSCOPY WITH STENT REPLACEMENT;  Surgeon: [name], MD;  Location: [facility];  Service: Urology;  Laterality: Right;  . CYSTOSCOPY W/ URETERAL STENT PLACEMENT Right 05/31/2017   Procedure: CYSTOSCOPY WITH STENT REPLACEMENT;  Surgeon: [name], MD;  Location: [facility];  Service: Urology;  Laterality: Right;  . CYSTOSCOPY W/ URETERAL STENT PLACEMENT Right 08/02/2017   Procedure: CYSTOSCOPY WITH STENT REPLACEMENT;  Surgeon: [name], MD;  Location: [facility];  Service: Urology;  Laterality: Right;  . CYSTOSCOPY W/ URETERAL STENT REMOVAL Left 09/14/2016   Procedure: CYSTOSCOPY WITH STENT REMOVAL;  Surgeon: [name], MD;  Location: [facility];  Service: Urology;  Laterality: Left;  . CYSTOSCOPY WITH STENT PLACEMENT Left 05/09/2016   Procedure: CYSTOSCOPY WITH STENT PLACEMENT;  Surgeon: [name], MD;  Location: [facility];  Service: Urology;  Laterality: Left;  . EYE SURGERY Left 2018   tear duct reconstruction  . HERNIA REPAIR     umbilical  . JOINT REPLACEMENT Left 2013   TKR  . JOINT REPLACEMENT Right 2011   TKR  . LAPAROSCOPIC HYSTERECTOMY    . REDUCTION MAMMAPLASTY    . REPLACEMENT TOTAL KNEE Bilateral   . SKIN CANCER  EXCISION Left 2018   lower eye lid  . URETEROSCOPY Bilateral 05/09/2016   Procedure: URETEROSCOPY;  Surgeon: [name], MD;  Location: [facility];  Service: Urology;  Laterality: Bilateral;  . URETEROSCOPY WITH HOLMIUM LASER LITHOTRIPSY Left 07/04/2016   Procedure: URETEROSCOPY WITH HOLMIUM LASER LITHOTRIPSY;  Surgeon: [name], MD;  Location: [facility];  Service: Urology;  Laterality: Left;    Home Medications:  Allergies as of 10/09/2017      Reactions   Ibuprofen Itching, Nausea Only, Rash      Medication List        Accurate as of 10/09/17  2:15 PM. Always use your most recent med list.          albuterol 108 (90 Base) MCG/ACT inhaler Commonly known as:  PROVENTIL HFA;VENTOLIN HFA Inhale 2 puffs into the lungs every 4 (four) hours as needed for wheezing or shortness of breath.   albuterol (2.5 MG/3ML) 0.083% nebulizer solution Commonly known as:  PROVENTIL Inhale 2.5 mg into the lungs every 4 (four) hours as needed for wheezing or shortness of breath.   alendronate 70 MG tablet Commonly  known as:  FOSAMAX Take 70 mg by mouth once weekly on Friday   aspirin EC 81 MG tablet Take 81 mg by mouth daily.   atorvastatin 40 MG tablet Commonly known as:  LIPITOR Take 40 mg by mouth daily.   benzonatate 100 MG capsule Commonly known as:  TESSALON Take 100 mg by mouth 3 (three) times daily.   buPROPion 150 MG 24 hr tablet Commonly known as:  WELLBUTRIN XL Take 150 mg by mouth daily.   donepezil 5 MG tablet Commonly known as:  ARICEPT Take 5 mg by mouth daily.   furosemide 20 MG tablet Commonly known as:  LASIX Take 20 mg by mouth daily.   insulin lispro 100 UNIT/ML KiwkPen Commonly known as:  HUMALOG Inject 12 Units into the skin 3 (three) times daily before meals.   LANTUS SOLOSTAR 100 UNIT/ML Solostar Pen Generic drug:  Insulin Glargine Inject 40 Units into the skin daily at 10 pm.   levothyroxine 75 MCG tablet Commonly known as:  SYNTHROID,  LEVOTHROID Take 75 mcg by mouth daily before breakfast.   multivitamin with minerals Tabs tablet Take 1 tablet by mouth daily.   naproxen sodium 220 MG tablet Commonly known as:  ALEVE Take 220 mg by mouth 2 (two) times daily as needed (for pain or headache).   OXYGEN Inhale 2 Doses into the lungs daily as needed.   pantoprazole 40 MG tablet Commonly known as:  PROTONIX Take 40 mg by mouth daily.   telmisartan 40 MG tablet Commonly known as:  MICARDIS TAKE 1 TABLET (40 MG) BY MOUTH DAILY IN THE MORNING       Allergies:  Allergies  Allergen Reactions  . Ibuprofen Itching, Nausea Only and Rash    Family History: Family History  Problem Relation Age of Onset  . Liver cancer Mother   . Colon cancer Mother   . Breast cancer Mother 12  . Diabetes Daughter   . Kidney disease Daughter        adrenal tumors  . Kidney cancer Neg Hx   . Prostate cancer Neg Hx     Social History:  reports that  has never smoked. she has never used smokeless tobacco. She reports that she does not drink alcohol or use drugs.  ROS: UROLOGY Frequent Urination?: No Hard to postpone urination?: No Burning/pain with urination?: No Get up at night to urinate?: No Leakage of urine?: Yes Urine stream starts and stops?: Yes Trouble starting stream?: No Do you have to strain to urinate?: No Blood in urine?: No Urinary tract infection?: No Sexually transmitted disease?: No Injury to kidneys or bladder?: No Painful intercourse?: No Weak stream?: No Currently pregnant?: No Vaginal bleeding?: No Last menstrual period?: n  Gastrointestinal Nausea?: No Vomiting?: No Indigestion/heartburn?: No Diarrhea?: No Constipation?: No  Constitutional Fever: No Night sweats?: No Weight loss?: No Fatigue?: No  Skin Skin rash/lesions?: No Itching?: No  Eyes Blurred vision?: No Double vision?: No  Ears/Nose/Throat Sore throat?: No Sinus problems?: No  Hematologic/Lymphatic Swollen  glands?: No Easy bruising?: No  Cardiovascular Leg swelling?: Yes Chest pain?: No  Respiratory Cough?: Yes Shortness of breath?: Yes  Endocrine Excessive thirst?: Yes  Musculoskeletal Back pain?: No Joint pain?: Yes  Neurological Headaches?: Yes Dizziness?: Yes  Psychologic Depression?: Yes Anxiety?: Yes  Physical Exam: BP (!) 175/76 (BP Location: Right Arm, Patient Position: Sitting, Cuff Size: Normal)   Pulse 98   Ht 4\' 8"  (1.422 m)   Wt 162 lb 9.6 oz (73.8 kg)  BMI 36.45 kg/m   Constitutional:  Alert and oriented, No acute distress. HEENT: [place] AT, moist mucus membranes.  Trachea midline, no masses. Cardiovascular: No clubbing, cyanosis, or edema.  RRR Respiratory: Normal respiratory effort, no increased work of breathing.  Lungs clear GI: Abdomen is soft, nontender, nondistended, no abdominal masses GU: No CVA tenderness Lymph: No cervical or inguinal lymphadenopathy. Skin: No rashes, bruises or suspicious lesions. Neurologic: Grossly intact, no focal deficits, moving all 4 extremities. Psychiatric: Normal mood and affect.  Laboratory Data: Lab Results  Component Value Date   WBC 10.2 05/23/2017   HGB 10.4 (L) 05/23/2017   HCT 33.6 (L) 05/23/2017   MCV 71.8 (L) 05/23/2017   PLT 284 05/23/2017    Lab Results  Component Value Date   CREATININE 0.73 05/23/2017     Assessment & Plan:   72 year old female with symptomatic right UPJ obstruction.  Will schedule cystoscopy with stent exchange.  The procedure was again discussed in detail including potential risks of bleeding, infection, ureteral injury and anesthetic risk.  She was present with her daughter today.  All questions were answered and they desire to schedule.   Abbie Sons, [place] [address] [place], [place] [address] [phone]

## 2017-10-21 ENCOUNTER — Ambulatory Visit (INDEPENDENT_AMBULATORY_CARE_PROVIDER_SITE_OTHER): Payer: Medicare Other

## 2017-10-21 ENCOUNTER — Encounter
Admission: RE | Admit: 2017-10-21 | Discharge: 2017-10-21 | Disposition: A | Discharge status: Home / Self Care | Payer: Medicare Other | Source: Ambulatory Visit | Attending: Urology | Admitting: Urology

## 2017-10-21 ENCOUNTER — Other Ambulatory Visit: Payer: Self-pay

## 2017-10-21 VITALS — BP 157/77 | HR 92 | Ht <= 58 in | Wt 165.0 lb

## 2017-10-21 DIAGNOSIS — Z01818 Encounter for other preprocedural examination: Secondary | ICD-10-CM | POA: Diagnosis present

## 2017-10-21 DIAGNOSIS — N135 Crossing vessel and stricture of ureter without hydronephrosis: Secondary | ICD-10-CM | POA: Diagnosis not present

## 2017-10-21 LAB — URINALYSIS, COMPLETE
Bilirubin, UA: NEGATIVE
Glucose, UA: NEGATIVE
Ketones, UA: NEGATIVE
Leukocytes, UA: NEGATIVE
NITRITE UA: NEGATIVE
Protein, UA: NEGATIVE
RBC, UA: NEGATIVE
Specific Gravity, UA: 1.01 (ref 1.005–1.030)
UUROB: 0.2 mg/dL (ref 0.2–1.0)
pH, UA: 7 (ref 5.0–7.5)

## 2017-10-21 NOTE — Progress Notes (Signed)
In and Out Catheterization  Patient is present today for a I & O catheterization due to upcoming stent exchange. Patient was cleaned and prepped in a sterile fashion with betadine and Lidocaine 2% jelly was instilled into the urethra.  A 14FR cath was inserted no complications were noted , 281ml of urine return was noted, urine was clear in color. A clean urine sample was collected for u/a and cx. Bladder was drained  And catheter was removed with out difficulty.    Preformed by: Toniann Fail, LPN   Blood pressure (!) 157/77, pulse 92, height 4\' 8"  (1.422 m), weight 165 lb (74.8 kg).

## 2017-10-21 NOTE — Patient Instructions (Signed)
Your procedure is scheduled on: Tues 10/29/17 Report to Day Surgery. To find out your arrival time please call [phone] between 1PM - 3PM on Mon.10/28/17  Remember: Instructions that are not followed completely may result in serious medical risk, up to and including death, or upon the discretion of your surgeon and anesthesiologist your surgery may need to be rescheduled.     _X__ 1. Do not eat food after midnight the night before your procedure.                 No gum chewing or hard candies. You may drink clear liquids up to 2 hours                 before you are scheduled to arrive for your surgery- DO not drink clear                 liquids within 2 hours of the start of your surgery.                 water, .  __X__2.  On the morning of surgery brush your teeth with toothpaste and water, you  may rinse your mouth with mouthwash if you wish.  Do not swallow any              toothpaste of mouthwash.     ___ 3.  No Alcohol for 24 hours before or after surgery.   ___ 4.  Do Not Smoke or use e-cigarettes For 24 Hours Prior to Your Surgery.                 Do not use any chewable tobacco products for at least 6 hours prior to                 surgery.  ____  5.  Bring all medications with you on the day of surgery if instructed.   _x___  6.  Notify your doctor if there is any change in your medical condition      (cold, fever, infections).     Do not wear jewelry, make-up, hairpins, clips or nail polish. Do not wear lotions, powders, or perfumes. You may wear deodorant. Do not shave 48 hours prior to surgery. Men may shave face and neck. Do not bring valuables to the hospital.    [facility] is not responsible for any belongings or valuables.  Contacts, dentures or bridgework may not be worn into surgery. Leave your suitcase in the car. After surgery it may be brought to your room. For patients admitted to the hospital, discharge time is determined by  your treatment team.   Patients discharged the day of surgery will not be allowed to drive home.   Please read over the following fact sheets that you were given:    _x___ Take these medicines the morning of surgery with A SIP OF WATER:    1. atorvastatin (LIPITOR) 40 MG tablet  2. benzonatate (TESSALON) 100 MG capsule if needed  3. buPROPion (WELLBUTRIN XL) 150 MG 24 hr tablet  4.levothyroxine (SYNTHROID, LEVOTHROID) 75 MCG tablet  5.pantoprazole (PROTONIX) 40 MG tablet  6.telmisartan (MICARDIS) 40 MG tablet  ____ Fleet Enema (as directed)   ____ Use CHG Soap as directed  __x__ Use inhalers on the day of surgery albuterol (PROVENTIL HFA;VENTOLIN HFA) 108 (90 Base) MCG/ACT inhaler and bring with you to the hospital  __x__ Stop metformin 2 days prior to surgery last dose on 3/23  __x__ Take 1/2 of usual insulin dose the night before surgery. No insulin the morning          of surgery.   _ x___ Stop aspirin tomorrow  __x__ Stop Anti-inflammatories tomorrow naproxen sodium (ALEVE) 220 MG tablet.  Tylenol is OK   ____ Stop supplements until after surgery.    __x__ Bring C-Pap to the hospital.

## 2017-10-22 ENCOUNTER — Inpatient Hospital Stay: Admission: RE | Admit: 2017-10-22 | Payer: Medicare Other | Source: Ambulatory Visit

## 2017-10-23 LAB — CULTURE, URINE COMPREHENSIVE

## 2017-10-28 MED ORDER — CEFAZOLIN SODIUM-DEXTROSE 2-4 GM/100ML-% IV SOLN
2.0000 g | INTRAVENOUS | Status: AC
  Administered 2017-10-29: 2 g via INTRAVENOUS

## 2017-10-29 ENCOUNTER — Ambulatory Visit
Admission: RE | Admit: 2017-10-29 | Discharge: 2017-10-29 | Disposition: A | Discharge status: Home / Self Care | Payer: Medicare Other | Source: Ambulatory Visit | Attending: Urology | Admitting: Urology

## 2017-10-29 ENCOUNTER — Ambulatory Visit: Payer: Medicare Other | Admitting: Anesthesiology

## 2017-10-29 ENCOUNTER — Encounter
Admission: RE | Disposition: A | Discharge status: Home / Self Care | Payer: Self-pay | Source: Ambulatory Visit | Attending: Urology

## 2017-10-29 ENCOUNTER — Other Ambulatory Visit: Payer: Self-pay

## 2017-10-29 DIAGNOSIS — Z9981 Dependence on supplemental oxygen: Secondary | ICD-10-CM | POA: Diagnosis not present

## 2017-10-29 DIAGNOSIS — I110 Hypertensive heart disease with heart failure: Secondary | ICD-10-CM | POA: Insufficient documentation

## 2017-10-29 DIAGNOSIS — M1990 Unspecified osteoarthritis, unspecified site: Secondary | ICD-10-CM | POA: Diagnosis not present

## 2017-10-29 DIAGNOSIS — E039 Hypothyroidism, unspecified: Secondary | ICD-10-CM | POA: Diagnosis not present

## 2017-10-29 DIAGNOSIS — Z79899 Other long term (current) drug therapy: Secondary | ICD-10-CM | POA: Diagnosis not present

## 2017-10-29 DIAGNOSIS — K219 Gastro-esophageal reflux disease without esophagitis: Secondary | ICD-10-CM | POA: Diagnosis not present

## 2017-10-29 DIAGNOSIS — N1330 Unspecified hydronephrosis: Secondary | ICD-10-CM | POA: Diagnosis not present

## 2017-10-29 DIAGNOSIS — Z794 Long term (current) use of insulin: Secondary | ICD-10-CM | POA: Diagnosis not present

## 2017-10-29 DIAGNOSIS — Z7982 Long term (current) use of aspirin: Secondary | ICD-10-CM | POA: Diagnosis not present

## 2017-10-29 DIAGNOSIS — Z85828 Personal history of other malignant neoplasm of skin: Secondary | ICD-10-CM | POA: Insufficient documentation

## 2017-10-29 DIAGNOSIS — M109 Gout, unspecified: Secondary | ICD-10-CM | POA: Diagnosis not present

## 2017-10-29 DIAGNOSIS — Z87442 Personal history of urinary calculi: Secondary | ICD-10-CM | POA: Insufficient documentation

## 2017-10-29 DIAGNOSIS — I509 Heart failure, unspecified: Secondary | ICD-10-CM | POA: Insufficient documentation

## 2017-10-29 DIAGNOSIS — N135 Crossing vessel and stricture of ureter without hydronephrosis: Secondary | ICD-10-CM | POA: Insufficient documentation

## 2017-10-29 DIAGNOSIS — G4730 Sleep apnea, unspecified: Secondary | ICD-10-CM | POA: Insufficient documentation

## 2017-10-29 DIAGNOSIS — F0280 Dementia in other diseases classified elsewhere without behavioral disturbance: Secondary | ICD-10-CM | POA: Diagnosis not present

## 2017-10-29 DIAGNOSIS — F419 Anxiety disorder, unspecified: Secondary | ICD-10-CM | POA: Insufficient documentation

## 2017-10-29 DIAGNOSIS — J449 Chronic obstructive pulmonary disease, unspecified: Secondary | ICD-10-CM | POA: Insufficient documentation

## 2017-10-29 DIAGNOSIS — E785 Hyperlipidemia, unspecified: Secondary | ICD-10-CM | POA: Diagnosis not present

## 2017-10-29 DIAGNOSIS — E119 Type 2 diabetes mellitus without complications: Secondary | ICD-10-CM | POA: Diagnosis not present

## 2017-10-29 DIAGNOSIS — G309 Alzheimer's disease, unspecified: Secondary | ICD-10-CM | POA: Insufficient documentation

## 2017-10-29 DIAGNOSIS — F329 Major depressive disorder, single episode, unspecified: Secondary | ICD-10-CM | POA: Insufficient documentation

## 2017-10-29 DIAGNOSIS — Z886 Allergy status to analgesic agent status: Secondary | ICD-10-CM | POA: Insufficient documentation

## 2017-10-29 HISTORY — PX: CYSTOSCOPY W/ URETERAL STENT PLACEMENT: SHX1429

## 2017-10-29 LAB — GLUCOSE, CAPILLARY
GLUCOSE-CAPILLARY: 175 mg/dL — AB (ref 65–99)
Glucose-Capillary: 192 mg/dL — ABNORMAL HIGH (ref 65–99)

## 2017-10-29 SURGERY — CYSTOSCOPY, FLEXIBLE, WITH STENT REPLACEMENT
Anesthesia: General | Laterality: Right | Wound class: Clean Contaminated

## 2017-10-29 MED ORDER — HYDROCODONE-ACETAMINOPHEN 5-325 MG PO TABS: 1.0000 | ORAL_TABLET | Freq: Four times a day (QID) | ORAL | 0 refills | Status: DC | PRN

## 2017-10-29 MED ORDER — DEXAMETHASONE SODIUM PHOSPHATE 10 MG/ML IJ SOLN
INTRAMUSCULAR | Status: AC
  Filled 2017-10-29: qty 1

## 2017-10-29 MED ORDER — PROPOFOL 10 MG/ML IV BOLUS
INTRAVENOUS | Status: DC | PRN
  Administered 2017-10-29: 140 mg via INTRAVENOUS
  Administered 2017-10-29: 70 mg via INTRAVENOUS
  Administered 2017-10-29: 80 mg via INTRAVENOUS

## 2017-10-29 MED ORDER — PROPOFOL 10 MG/ML IV BOLUS
INTRAVENOUS | Status: AC
  Filled 2017-10-29: qty 20

## 2017-10-29 MED ORDER — HYDROMORPHONE HCL 1 MG/ML IJ SOLN: 0.2500 mg | INTRAMUSCULAR | Status: DC | PRN

## 2017-10-29 MED ORDER — CEFAZOLIN SODIUM-DEXTROSE 2-4 GM/100ML-% IV SOLN
INTRAVENOUS | Status: AC
  Filled 2017-10-29: qty 100

## 2017-10-29 MED ORDER — PROMETHAZINE HCL 25 MG/ML IJ SOLN: 6.2500 mg | INTRAMUSCULAR | Status: DC | PRN

## 2017-10-29 MED ORDER — ARTIFICIAL TEARS OPHTHALMIC OINT
TOPICAL_OINTMENT | OPHTHALMIC | Status: AC
  Filled 2017-10-29: qty 3.5

## 2017-10-29 MED ORDER — FENTANYL CITRATE (PF) 100 MCG/2ML IJ SOLN
INTRAMUSCULAR | Status: AC
  Filled 2017-10-29: qty 2

## 2017-10-29 MED ORDER — ONDANSETRON HCL 4 MG/2ML IJ SOLN
INTRAMUSCULAR | Status: AC
  Filled 2017-10-29: qty 2

## 2017-10-29 MED ORDER — FENTANYL CITRATE (PF) 100 MCG/2ML IJ SOLN
INTRAMUSCULAR | Status: DC | PRN
  Administered 2017-10-29 (×2): 25 ug via INTRAVENOUS

## 2017-10-29 MED ORDER — MEPERIDINE HCL 50 MG/ML IJ SOLN: 6.2500 mg | INTRAMUSCULAR | Status: DC | PRN

## 2017-10-29 MED ORDER — IOTHALAMATE MEGLUMINE 43 % IV SOLN
INTRAVENOUS | Status: DC | PRN
  Administered 2017-10-29: 15 mL

## 2017-10-29 MED ORDER — PHENYLEPHRINE HCL 10 MG/ML IJ SOLN
INTRAMUSCULAR | Status: DC | PRN
  Administered 2017-10-29 (×2): 200 ug via INTRAVENOUS

## 2017-10-29 MED ORDER — LIDOCAINE HCL (CARDIAC) 20 MG/ML IV SOLN
INTRAVENOUS | Status: DC | PRN
  Administered 2017-10-29: 60 mg via INTRAVENOUS

## 2017-10-29 MED ORDER — LIDOCAINE HCL (PF) 2 % IJ SOLN
INTRAMUSCULAR | Status: AC
  Filled 2017-10-29: qty 10

## 2017-10-29 MED ORDER — ONDANSETRON HCL 4 MG/2ML IJ SOLN
INTRAMUSCULAR | Status: DC | PRN
  Administered 2017-10-29: 4 mg via INTRAVENOUS

## 2017-10-29 MED ORDER — SODIUM CHLORIDE 0.9 % IV SOLN
INTRAVENOUS | Status: DC
  Administered 2017-10-29: 06:00:00 via INTRAVENOUS

## 2017-10-29 MED ORDER — PHENYLEPHRINE HCL 10 MG/ML IJ SOLN
INTRAMUSCULAR | Status: AC
  Filled 2017-10-29: qty 1

## 2017-10-29 SURGICAL SUPPLY — 22 items
BAG DRAIN CYSTO-URO LG1000N (MISCELLANEOUS) ×3 IMPLANT
BRUSH SCRUB EZ  4% CHG (MISCELLANEOUS)
BRUSH SCRUB EZ 4% CHG (MISCELLANEOUS) IMPLANT
CATH URETL 5X70 OPEN END (CATHETERS) ×3 IMPLANT
CONRAY 43 FOR UROLOGY 50M (MISCELLANEOUS) ×3 IMPLANT
GLOVE BIOGEL PI IND STRL 8 (GLOVE) ×1 IMPLANT
GLOVE BIOGEL PI INDICATOR 8 (GLOVE) ×2
GOWN STANDARD XL  REUSABL (MISCELLANEOUS) ×3 IMPLANT
KIT TURNOVER CYSTO (KITS) ×3 IMPLANT
PACK CYSTO AR (MISCELLANEOUS) ×3 IMPLANT
SENSORWIRE 0.038 NOT ANGLED (WIRE) ×3
SET CYSTO W/LG BORE CLAMP LF (SET/KITS/TRAYS/PACK) ×3 IMPLANT
SOL .9 NS 3000ML IRR  AL (IV SOLUTION) ×2
SOL .9 NS 3000ML IRR AL (IV SOLUTION) ×1
SOL .9 NS 3000ML IRR UROMATIC (IV SOLUTION) ×1 IMPLANT
STENT URET 6FRX24 CONTOUR (STENTS) IMPLANT
STENT URET 6FRX26 CONTOUR (STENTS) IMPLANT
STENT URO INLAY 6FRX22CM (STENTS) ×3 IMPLANT
SURGILUBE 2OZ TUBE FLIPTOP (MISCELLANEOUS) ×3 IMPLANT
SYRINGE IRR TOOMEY STRL 70CC (SYRINGE) ×3 IMPLANT
WATER STERILE IRR 1000ML POUR (IV SOLUTION) ×3 IMPLANT
WIRE SENSOR 0.038 NOT ANGLED (WIRE) ×1 IMPLANT

## 2017-10-29 NOTE — Anesthesia Post-op Follow-up Note (Signed)
Anesthesia QCDR form completed.        

## 2017-10-29 NOTE — Anesthesia Postprocedure Evaluation (Signed)
Anesthesia Post Note  Patient: Lori Blanchard  Procedure(s) Performed: CYSTOSCOPY WITH STENT REPLACEMENT (Right )  Patient location during evaluation: PACU Anesthesia Type: General Level of consciousness: awake and alert Pain management: pain level controlled Vital Signs Assessment: post-procedure vital signs reviewed and stable Respiratory status: spontaneous breathing, nonlabored ventilation and respiratory function stable Cardiovascular status: blood pressure returned to baseline and stable Postop Assessment: no apparent nausea or vomiting Anesthetic complications: no     Last Vitals:  Vitals:   10/29/17 0912 10/29/17 0925  BP: (!) 131/59 (!) 140/53  Pulse: 91 92  Resp: 18 18  Temp: 37 C   SpO2: 92% 92%    Last Pain:  Vitals:   10/29/17 0912  TempSrc:   PainSc: 0-No pain                 Alphonsus Sias

## 2017-10-29 NOTE — Transfer of Care (Signed)
Immediate Anesthesia Transfer of Care Note  Patient: Lori Blanchard  Procedure(s) Performed: CYSTOSCOPY WITH STENT REPLACEMENT (Right )  Patient Location: PACU  Anesthesia Type:General  Level of Consciousness: drowsy  Airway & Oxygen Therapy: Patient Spontanous Breathing and Patient connected to face mask oxygen  Post-op Assessment: Report given to RN and Post -op Vital signs reviewed and stable  Post vital signs: Reviewed and stable  Last Vitals:  Vitals Value Taken Time  BP 107/50 10/29/2017  8:20 AM  Temp    Pulse 93 10/29/2017  8:22 AM  Resp 20 10/29/2017  8:22 AM  SpO2 100 % 10/29/2017  8:22 AM  Vitals shown include unvalidated device data.  Last Pain:  Vitals:   10/29/17 0557  TempSrc: Temporal  PainSc: 7          Complications: No apparent anesthesia complications

## 2017-10-29 NOTE — OR Nursing (Signed)
Discussed discharge instructions with pt and family. All voice understanding.

## 2017-10-29 NOTE — Anesthesia Preprocedure Evaluation (Addendum)
Anesthesia Evaluation  Patient identified by MRN, date of birth, ID band Patient awake    Reviewed: Allergy & Precautions, H&P , NPO status , reviewed documented beta blocker date and time   Airway Mallampati: III  TM Distance: >3 FB     Dental  (+) Chipped, Missing   Pulmonary shortness of breath, asthma , sleep apnea , COPD,    Pulmonary exam normal        Cardiovascular hypertension, +CHF  Normal cardiovascular exam     Neuro/Psych PSYCHIATRIC DISORDERS Anxiety Depression Dementia    GI/Hepatic GERD  Controlled,  Endo/Other  diabetesHypothyroidism   Renal/GU Renal disease     Musculoskeletal   Abdominal   Peds  Hematology   Anesthesia Other Findings Hyperlipemia   Hypertension   Asthma   Osteoarthritis   Gout   Osteoarthritis   Hypothyroidism   Anxiety and depression   Diabetes ([place])   Retroperitoneal fibrosis   Pulmonary nodule   Alzheimer disease   Sleep apnea   Depression   Family history of adverse reaction to anesthesia  daughter has breathing problems after anesthesia History of kidney stones   Hydronephrosis   COPD (chronic obstructive pulmonary disease) ([facility])   GERD (gastroesophageal reflux disease)   Diverticulosis   Dyspnea   Anxiety   Cancer ([facility])  Basal Cell Skin Cancer Alzheimer disease   Elevated lipids   CHF (congestive heart failure) ([facility])   Osteoporosis   Ureter injury   Lower extremity edema   Fatty liver      Reproductive/Obstetrics                            Anesthesia Physical Anesthesia Plan  ASA: III  Anesthesia Plan: General LMA   Post-op Pain Management:    Induction:   PONV Risk Score and Plan: 3 and Ondansetron and Midazolam  Airway Management Planned:   Additional Equipment:   Intra-op Plan:   Post-operative Plan:   Informed Consent: I have reviewed the patients History and Physical, chart, labs and discussed the procedure including the  risks, benefits and alternatives for the proposed anesthesia with the patient or authorized representative who has indicated his/her understanding and acceptance.   Dental Advisory Given  Plan Discussed with: CRNA  Anesthesia Plan Comments:         Anesthesia Quick Evaluation

## 2017-10-29 NOTE — Anesthesia Procedure Notes (Signed)
Procedure Name: LMA Insertion Date/Time: 10/29/2017 7:48 AM Performed by: Eben Burow, CRNA Pre-anesthesia Checklist: Patient identified, Emergency Drugs available, Suction available, Patient being monitored and Timeout performed Patient Re-evaluated:Patient Re-evaluated prior to induction Oxygen Delivery Method: Circle system utilized Preoxygenation: Pre-oxygenation with 100% oxygen Induction Type: IV induction LMA: LMA inserted LMA Size: 4.0 Number of attempts: 1 Placement Confirmation: positive ETCO2 and breath sounds checked- equal and bilateral Tube secured with: Tape Dental Injury: Teeth and Oropharynx as per pre-operative assessment

## 2017-10-29 NOTE — Discharge Instructions (Signed)

## 2017-10-29 NOTE — Interval H&P Note (Signed)
History and Physical Interval Note:  10/29/2017 7:19 AM  [place]  has presented today for surgery, with the diagnosis of right UPJ obstruction  The various methods of treatment have been discussed with the patient and family. After consideration of risks, benefits and other options for treatment, the patient has consented to  Procedure(s): CYSTOSCOPY WITH STENT REPLACEMENT (Right) as a surgical intervention .  The patient's history has been reviewed, patient examined, no change in status, stable for surgery.  I have reviewed the patient's chart and labs.  Questions were answered to the patient's satisfaction.     [place]

## 2017-10-29 NOTE — OR Nursing (Signed)
Dr Bernardo Heater called for pain medication. He is going to send in order for norco for pt.

## 2017-10-29 NOTE — Op Note (Signed)
Preoperative diagnosis:  1. Right UPJ obstruction  Postoperative diagnosis:  1. Right UPJ obstruction  Procedure: 1. Cystoscopy with removal of right ureteral stent 2. Right retrograde pyelogram with interpretation 3. Exchange of right ureteral stent  Surgeon: Abbie Sons, MD  Anesthesia: General  Complications: None  Intraoperative findings:  Right retrograde pyelogram: Moderate right hydronephrosis with narrowing at the UPJ.  EBL: Minimal  Specimens: None  Indication: Lori Blanchard is a 72 y.o. 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  She is felt to be a poor surgical candidate and has managed with ureteral stent drainage.  She presents today for stent exchange.  After reviewing the management options for treatment, he elected to proceed with the above surgical procedure(s). We have discussed the potential benefits and risks of the procedure, side effects of the proposed treatment, the likelihood of the patient achieving the goals of the procedure, and any potential problems that might occur during the procedure or recuperation. Informed consent has been obtained.  Description of procedure:  The patient was taken to the operating room and general anesthesia was induced.  The patient was placed in the dorsal lithotomy position, prepped and draped in the usual sterile fashion, and preoperative antibiotics were administered. A preoperative time-out was performed.   A 22 French cystoscope was irrigated and passed per urethra.  Panendoscopy was performed and the bladder mucosa was normal in appearance without erythema, solid or papillary lesions.  The stent length was long in the bladder with appropriate proximal positioning noted on fluoroscopy.  The stent was grasped with endoscopic forceps and brought out to the urethral meatus.  No encrustation was noted.  A 0.038 Sensor wire was easily placed through the stent up to the renal pelvis.  The ureteral  stent was removed.  A 6 French open-ended catheter was placed over the wire and after removing the guidewire right retrograde pyelogram was performed with findings as described above.  The guidewire was replaced and the ureteral catheter was removed.  A 6 French/22 cm Bard Optima ureteral stent was placed over the wire under fluoroscopic guidance.  There was good curl seen in the renal pelvis under fluoroscopy.  The cystoscope was repassed and the distal end of stent was well-positioned in the bladder.  The bladder was emptied and the cystoscope was removed.  After anesthetic reversal the patient was transported to the PACU in stable condition.   Abbie Sons, M.D.

## 2017-12-31 ENCOUNTER — Ambulatory Visit
Admission: RE | Admit: 2017-12-31 | Discharge: 2017-12-31 | Disposition: A | Discharge status: Home / Self Care | Payer: Medicare Other | Source: Ambulatory Visit | Attending: Urology | Admitting: Urology

## 2017-12-31 ENCOUNTER — Ambulatory Visit (INDEPENDENT_AMBULATORY_CARE_PROVIDER_SITE_OTHER): Payer: Medicare Other | Admitting: Urology

## 2017-12-31 ENCOUNTER — Encounter: Payer: Self-pay | Admitting: Urology

## 2017-12-31 VITALS — BP 145/77 | HR 81 | Ht <= 58 in | Wt 159.4 lb

## 2017-12-31 DIAGNOSIS — N200 Calculus of kidney: Secondary | ICD-10-CM

## 2017-12-31 DIAGNOSIS — Z9689 Presence of other specified functional implants: Secondary | ICD-10-CM | POA: Diagnosis not present

## 2017-12-31 DIAGNOSIS — N135 Crossing vessel and stricture of ureter without hydronephrosis: Secondary | ICD-10-CM

## 2017-12-31 LAB — MICROSCOPIC EXAMINATION

## 2017-12-31 LAB — URINALYSIS, COMPLETE
BILIRUBIN UA: NEGATIVE
Glucose, UA: NEGATIVE
Ketones, UA: NEGATIVE
Nitrite, UA: NEGATIVE
PH UA: 5 (ref 5.0–7.5)
PROTEIN UA: NEGATIVE
SPEC GRAV UA: 1.01 (ref 1.005–1.030)
Urobilinogen, Ur: 0.2 mg/dL (ref 0.2–1.0)

## 2017-12-31 MED ORDER — FLUCONAZOLE 150 MG PO TABS: 150.0000 mg | ORAL_TABLET | Freq: Once | ORAL | 0 refills | Status: AC

## 2017-12-31 NOTE — Progress Notes (Signed)
12/31/2017 10:37 AM   Lori Blanchard [place] 15-Mar-1946 [id number]  Referring provider: [name], MD [facility] [place], [place] [address]  Chief Complaint  Patient presents with  . Ureteropelvic Junction Obstruction    HPI: 72 year old female with chronic right symptomatic UPJ obstruction which has been managed with a chronic ureteral stent.  Her stent was last changed on 10/22/2017.  The removed stent at that time was not encrusted.  She complains of mild dysuria.  She had right lower quadrant pain for several days after her last stent exchange.  She recently completed antibiotics and states she has a yeast infection.   PMH: Past Medical History:  Diagnosis Date  . Alzheimer disease   . Anxiety   . Anxiety and depression   . Asthma   . Cancer ([place])    Basal Cell Skin Cancer  . CHF (congestive heart failure) ([place])   . COPD (chronic obstructive pulmonary disease) ([place])   . Depression   . Diabetes ([place])   . Diverticulosis   . Dyspnea   . Elevated lipids   . Fatty liver   . GERD (gastroesophageal reflux disease)   . Gout   . History of kidney stones   . Hydronephrosis   . Hyperlipemia   . Hypertension   . Hypothyroidism   . Lower extremity edema   . Osteoarthritis   . Osteoarthritis   . Osteoporosis   . Pulmonary nodule   . Retroperitoneal fibrosis   . Sleep apnea    CPAP  . Ureter injury     Surgical History: Past Surgical History:  Procedure Laterality Date  . ABDOMINAL HYSTERECTOMY     partial  . ANKLE FRACTURE SURGERY Left   . APPENDECTOMY  1965  . BREAST SURGERY Bilateral 1990   Reduction  . c setion    . CARPAL TUNNEL RELEASE Bilateral   . CESAREAN SECTION     x 3  . CYSTOSCOPY W/ RETROGRADES Right 11/02/2015   Procedure: CYSTOSCOPY WITH RETROGRADE PYELOGRAM, URETERAL STENT EXCHANGE;  Surgeon: [name], MD;  Location: [facility];  Service: Urology;  Laterality: Right;  . CYSTOSCOPY W/ RETROGRADES Right  01/31/2016   Procedure: CYSTOSCOPY WITH RETROGRADE PYELOGRAM;  Surgeon: [name], MD;  Location: [facility];  Service: Urology;  Laterality: Right;  . CYSTOSCOPY W/ RETROGRADES Bilateral 05/09/2016   Procedure: CYSTOSCOPY WITH RETROGRADE PYELOGRAM;  Surgeon: [name], MD;  Location: [facility];  Service: Urology;  Laterality: Bilateral;  . CYSTOSCOPY W/ RETROGRADES Left 11/21/2016   Procedure: CYSTOSCOPY WITH RETROGRADE PYELOGRAM;  Surgeon: [name], MD;  Location: [facility];  Service: Urology;  Laterality: Left;  . CYSTOSCOPY W/ RETROGRADES Right 03/27/2017   Procedure: CYSTOSCOPY WITH RETROGRADE PYELOGRAM;  Surgeon: [name], MD;  Location: [facility];  Service: Urology;  Laterality: Right;  . CYSTOSCOPY W/ RETROGRADES Bilateral 05/31/2017   Procedure: CYSTOSCOPY WITH RETROGRADE PYELOGRAM;  Surgeon: [name], MD;  Location: [facility];  Service: Urology;  Laterality: Bilateral;  . CYSTOSCOPY W/ URETERAL STENT PLACEMENT Right 05/11/2015   Procedure: CYSTOSCOPY WITH STENT REPLACEMENT;  Surgeon: [name], MD;  Location: [facility];  Service: Urology;  Laterality: Right;  . CYSTOSCOPY W/ URETERAL STENT PLACEMENT Right 01/31/2016   Procedure: CYSTOSCOPY, RETROGRADE PYELOGRAMS WITH STENT REPLACEMENT;  Surgeon: [name], MD;  Location: [facility];  Service: Urology;  Laterality: Right;  . CYSTOSCOPY W/ URETERAL STENT PLACEMENT Right 05/09/2016   Procedure: CYSTOSCOPY  WITH STENT REPLACEMENT;  Surgeon: [name], MD;  Location: [facility];  Service: Urology;  Laterality: Right;  . CYSTOSCOPY W/ URETERAL STENT PLACEMENT Bilateral 07/04/2016   Procedure: CYSTOSCOPY WITH STENT REPLACEMENT;  Surgeon: [name], MD;  Location: [facility];  Service: Urology;  Laterality: Bilateral;  . CYSTOSCOPY W/ URETERAL STENT PLACEMENT Right 09/14/2016   Procedure: CYSTOSCOPY WITH STENT REPLACEMENT;  Surgeon: [name], MD;  Location: [facility];   Service: Urology;  Laterality: Right;  . CYSTOSCOPY W/ URETERAL STENT PLACEMENT Right 11/21/2016   Procedure: CYSTOSCOPY WITH STENT REPLACEMENT;  Surgeon: [name], MD;  Location: [facility];  Service: Urology;  Laterality: Right;  . CYSTOSCOPY W/ URETERAL STENT PLACEMENT Right 01/25/2017   Procedure: CYSTOSCOPY WITH STENT REPLACEMENT;  Surgeon: [name], MD;  Location: [facility];  Service: Urology;  Laterality: Right;  . CYSTOSCOPY W/ URETERAL STENT PLACEMENT Right 03/27/2017   Procedure: CYSTOSCOPY WITH STENT REPLACEMENT;  Surgeon: [name], MD;  Location: [facility];  Service: Urology;  Laterality: Right;  . CYSTOSCOPY W/ URETERAL STENT PLACEMENT Right 05/31/2017   Procedure: CYSTOSCOPY WITH STENT REPLACEMENT;  Surgeon: [name], MD;  Location: [facility];  Service: Urology;  Laterality: Right;  . CYSTOSCOPY W/ URETERAL STENT PLACEMENT Right 08/02/2017   Procedure: CYSTOSCOPY WITH STENT REPLACEMENT;  Surgeon: [name], MD;  Location: [facility];  Service: Urology;  Laterality: Right;  . CYSTOSCOPY W/ URETERAL STENT PLACEMENT Right 10/29/2017   Procedure: CYSTOSCOPY WITH STENT REPLACEMENT;  Surgeon: Abbie Sons, MD;  Location: [facility];  Service: Urology;  Laterality: Right;  . CYSTOSCOPY W/ URETERAL STENT REMOVAL Left 09/14/2016   Procedure: CYSTOSCOPY WITH STENT REMOVAL;  Surgeon: [name], MD;  Location: [facility];  Service: Urology;  Laterality: Left;  . CYSTOSCOPY WITH STENT PLACEMENT Left 05/09/2016   Procedure: CYSTOSCOPY WITH STENT PLACEMENT;  Surgeon: [name], MD;  Location: [facility];  Service: Urology;  Laterality: Left;  . EYE SURGERY Left 2018   tear duct reconstruction  . HERNIA REPAIR     umbilical  . JOINT REPLACEMENT Left 2013   TKR  . JOINT REPLACEMENT Right 2011   TKR  . LAPAROSCOPIC HYSTERECTOMY    . REDUCTION MAMMAPLASTY    . REPLACEMENT TOTAL KNEE Bilateral   . SKIN CANCER EXCISION Left 2018   lower eye  lid  . URETEROSCOPY Bilateral 05/09/2016   Procedure: URETEROSCOPY;  Surgeon: [name], MD;  Location: [facility];  Service: Urology;  Laterality: Bilateral;  . URETEROSCOPY WITH HOLMIUM LASER LITHOTRIPSY Left 07/04/2016   Procedure: URETEROSCOPY WITH HOLMIUM LASER LITHOTRIPSY;  Surgeon: [name], MD;  Location: [facility];  Service: Urology;  Laterality: Left;    Home Medications:  Allergies as of 12/31/2017      Reactions   Ibuprofen Itching, Nausea Only, Rash      Medication List        Accurate as of 12/31/17 10:37 AM. Always use your most recent med list.          albuterol 108 (90 Base) MCG/ACT inhaler Commonly known as:  PROVENTIL HFA;VENTOLIN HFA Inhale 2 puffs into the lungs every 4 (four) hours as needed for wheezing or shortness of breath.   albuterol (2.5 MG/3ML) 0.083% nebulizer solution Commonly known as:  PROVENTIL Inhale 2.5 mg into the lungs every 4 (four) hours as needed for wheezing or shortness of breath.   alendronate 70 MG tablet Commonly known as:  FOSAMAX Take  70 mg by mouth once weekly on Friday   aspirin EC 81 MG tablet Take 81 mg by mouth daily.   atorvastatin 40 MG tablet Commonly known as:  LIPITOR Take 40 mg by mouth daily.   benzonatate 100 MG capsule Commonly known as:  TESSALON Take 100 mg by mouth 3 (three) times daily as needed for cough.   buPROPion 150 MG 24 hr tablet Commonly known as:  WELLBUTRIN XL Take 150 mg by mouth daily.   docusate sodium 100 MG capsule Commonly known as:  COLACE Take 100 mg by mouth daily as needed for mild constipation.   furosemide 20 MG tablet Commonly known as:  LASIX Take 20 mg by mouth daily.   HYDROcodone-acetaminophen 5-325 MG tablet Commonly known as:  NORCO/VICODIN Take 1 tablet by mouth every 6 (six) hours as needed for moderate pain.   insulin lispro 100 UNIT/ML KiwkPen Commonly known as:  HUMALOG Inject 12 Units into the skin 3 (three) times daily before meals.     LANTUS SOLOSTAR 100 UNIT/ML Solostar Pen Generic drug:  Insulin Glargine Inject 25 Units into the skin daily at 10 pm.   levothyroxine 75 MCG tablet Commonly known as:  SYNTHROID, LEVOTHROID Take 75 mcg by mouth daily before breakfast.   metFORMIN 1000 MG tablet Commonly known as:  GLUCOPHAGE Take 1,000 mg by mouth 2 (two) times daily with a meal.   multivitamin with minerals Tabs tablet Take 1 tablet by mouth daily.   naproxen sodium 220 MG tablet Commonly known as:  ALEVE Take 220 mg by mouth 2 (two) times daily as needed (for pain or headache).   OXYGEN Inhale 2 Doses into the lungs daily as needed (at night with CPAP).   pantoprazole 40 MG tablet Commonly known as:  PROTONIX Take 40 mg by mouth daily.   QUEtiapine 25 MG tablet Commonly known as:  SEROQUEL Take 25 mg by mouth at bedtime.   telmisartan 40 MG tablet Commonly known as:  MICARDIS TAKE 1 TABLET (40 MG) BY MOUTH DAILY IN THE MORNING       Allergies:  Allergies  Allergen Reactions  . Ibuprofen Itching, Nausea Only and Rash    Family History: Family History  Problem Relation Age of Onset  . Liver cancer Mother   . Colon cancer Mother   . Breast cancer Mother 76  . Diabetes Daughter   . Kidney disease Daughter        adrenal tumors  . Kidney cancer Neg Hx   . Prostate cancer Neg Hx     Social History:  reports that she has never smoked. She has never used smokeless tobacco. She reports that she does not drink alcohol or use drugs.  ROS: UROLOGY Frequent Urination?: No Hard to postpone urination?: Yes Burning/pain with urination?: No Get up at night to urinate?: Yes Leakage of urine?: No Urine stream starts and stops?: No Trouble starting stream?: No Do you have to strain to urinate?: No Blood in urine?: No Urinary tract infection?: No Sexually transmitted disease?: No Injury to kidneys or bladder?: No Painful intercourse?: No Weak stream?: No Currently pregnant?: No Vaginal  bleeding?: No Last menstrual period?: n  Gastrointestinal Nausea?: No Vomiting?: No Indigestion/heartburn?: No Diarrhea?: Yes Constipation?: No  Constitutional Fever: No Night sweats?: No Weight loss?: No Fatigue?: Yes  Skin Skin rash/lesions?: No Itching?: No  Eyes Blurred vision?: No Double vision?: No  Ears/Nose/Throat Sore throat?: No Sinus problems?: No  Hematologic/Lymphatic Swollen glands?: No Easy bruising?: No  Cardiovascular Leg swelling?: Yes Chest pain?: No  Respiratory Cough?: No Shortness of breath?: No  Endocrine Excessive thirst?: No  Musculoskeletal Back pain?: No Joint pain?: Yes  Neurological Headaches?: No Dizziness?: No  Psychologic Depression?: No Anxiety?: Yes  Physical Exam: BP (!) 145/77   Pulse 81   Ht 4\' 8"  (1.422 m) Comment: wheelchair  Wt 159 lb 6.4 oz (72.3 kg) Comment: wheelchair  BMI 35.74 kg/m   Constitutional:  Alert and oriented, No acute distress.   Urinalysis Dipstick 1+ leukocytes/trace blood; microscopy negative  Assessment & Plan:   72 year old female with chronic right UPJ obstruction.  She is having mild dysuria.  A KUB was ordered to assess for encrustation and positioning.  Urinalysis today looks good and a culture was ordered.  Will tentatively schedule stent exchange July 2019.  Rx fluconazole sent to pharmacy.   Abbie Sons, [place] [address], [place], [place] [address] [phone]

## 2018-01-02 LAB — CULTURE, URINE COMPREHENSIVE

## 2018-01-03 ENCOUNTER — Telehealth: Payer: Self-pay | Admitting: Family Medicine

## 2018-01-03 NOTE — Telephone Encounter (Signed)
Patient notified and voiced understanding.

## 2018-01-03 NOTE — Telephone Encounter (Signed)
-----   Message from Abbie Sons, MD sent at 01/02/2018 12:12 PM EDT ----- Urine culture was negative for infection

## 2018-01-10 ENCOUNTER — Other Ambulatory Visit: Payer: Self-pay | Admitting: Radiology

## 2018-01-10 ENCOUNTER — Telehealth: Payer: Self-pay | Admitting: Radiology

## 2018-01-10 DIAGNOSIS — N135 Crossing vessel and stricture of ureter without hydronephrosis: Secondary | ICD-10-CM

## 2018-01-10 NOTE — Telephone Encounter (Signed)
Discussed next stent exchange with patient. Surgery date of 02/14/2018 was agreed upon. Will have patient return to clinic for cathed urine culture prior to surgery. Questions answered. Patient voices understanding.

## 2018-01-10 NOTE — Telephone Encounter (Signed)
-----   Message from Abbie Sons, MD sent at 01/01/2018  7:30 AM EDT ----- Stent is in good position.  Please schedule cystoscopy with stent exchange mid July.  Will complete orders.

## 2018-02-04 ENCOUNTER — Ambulatory Visit (INDEPENDENT_AMBULATORY_CARE_PROVIDER_SITE_OTHER): Payer: Medicare Other

## 2018-02-04 DIAGNOSIS — Z01818 Encounter for other preprocedural examination: Secondary | ICD-10-CM

## 2018-02-04 DIAGNOSIS — N200 Calculus of kidney: Secondary | ICD-10-CM | POA: Diagnosis not present

## 2018-02-04 DIAGNOSIS — N135 Crossing vessel and stricture of ureter without hydronephrosis: Secondary | ICD-10-CM

## 2018-02-04 NOTE — Progress Notes (Signed)
In and Out Catheterization  Patient is present today for a I & O catheterization due to recurrent UITs and pre op testing. Patient was cleaned and prepped in a sterile fashion with betadine.  A 14FR cath was inserted no complications were noted , 256ml of urine return was noted, urine was pale yellow in color. A clean urine sample was collected for culture. Bladder was drained  And catheter was removed with out difficulty.    Preformed by: Gordy Clement, CMA  Follow up/ Additional notes: As scheduled.

## 2018-02-07 ENCOUNTER — Encounter
Admission: RE | Admit: 2018-02-07 | Discharge: 2018-02-07 | Disposition: A | Discharge status: Home / Self Care | Payer: Medicare Other | Source: Ambulatory Visit | Attending: Urology | Admitting: Urology

## 2018-02-07 ENCOUNTER — Ambulatory Visit: Payer: Medicare Other

## 2018-02-07 ENCOUNTER — Other Ambulatory Visit: Payer: Self-pay

## 2018-02-07 DIAGNOSIS — I10 Essential (primary) hypertension: Secondary | ICD-10-CM | POA: Insufficient documentation

## 2018-02-07 DIAGNOSIS — Z01810 Encounter for preprocedural cardiovascular examination: Secondary | ICD-10-CM | POA: Insufficient documentation

## 2018-02-07 HISTORY — DX: Crossing vessel and stricture of ureter without hydronephrosis: N13.5

## 2018-02-07 LAB — CULTURE, URINE COMPREHENSIVE

## 2018-02-07 NOTE — Patient Instructions (Signed)
Your procedure is scheduled on: Friday, July 12,2019  Report to [facility]    DO NOT STOP ON THE FIRST FLOOR  To find out your arrival time please call [phone] between 1PM - 3PM on Thursday, February 13, 2018   Remember: Instructions that are not followed completely may result in serious medical risk,  up to and including death, or upon the discretion of your surgeon and anesthesiologist your  surgery may need to be rescheduled.     _X__ 1. Do not eat food after midnight the night before your procedure.                 No gum chewing or hard candies. NOTHING SOLID IN YOUR MOUTH AFTER MIDNIGHT                 You may drink clear liquids up to 2 hours before you are scheduled to arrive for your surgery-                   DO not drink clear liquids within 2 hours of the start of your surgery.                  Clear Liquids include:  water, apple juice without pulp, clear carbohydrate                 drink such as Clearfast of Gatorade, Black Coffee or Tea (Do not add                 anything to coffee or tea).  __X__2.  On the morning of surgery brush your teeth with toothpaste and water, you                may rinse your mouth with mouthwash if you wish.                    Do not swallow any toothpaste of mouthwash.     _X__ 3.  No Alcohol for 24 hours before or after surgery.   _X__ 4.  Do Not Smoke or use e-cigarettes For 24 Hours Prior to Your Surgery.                 Do not use any chewable tobacco products for at least 6 hours prior to                 surgery.  ____  5.  Bring all medications with you on the day of surgery if instructed.   ____  6.  Notify your doctor if there is any change in your medical condition      (cold, fever, infections).     Do not wear jewelry, make-up, hairpins, clips or nail polish. Do not wear lotions, powders, or perfumes. You may wear deodorant. Do not shave 48 hours prior to surgery. Men  may shave face and neck. Do not bring valuables to the hospital.    [facility] is not responsible for any belongings or valuables.  Contacts, dentures or bridgework may not be worn into surgery. Leave your suitcase in the car. After surgery it may be brought to your room. For patients admitted to the hospital, discharge time is determined by your treatment team.   Patients discharged the day of surgery will not be allowed to drive home.   Please read over the following fact sheets that you were given:   PREPARING FOR SURGERY  ____ Take these medicines the morning of surgery with A SIP OF WATER:    1. ALBUTEROL INHALER  2. WELLBUTRIN  3. LEVOTHYROXINE  4. PROTONIX  5. GABAPENTIN  6.  ____ Fleet Enema (as directed)   ____ Use CHG Soap as directed  _X___ Use inhalers on the day of surgery  _X___ Stop metformin 2 days prior to surgery. LAST DOSE ON July 9TH    _X___ Take 1/2 of usual insulin dose the night before surgery. No insulin the morning          of surgery. TAKE 12.5 UNITS OF LANTUS  __X__ Stop ASPIRIN PRODUCTS AS OF TODAY.  _X___ Stop Anti-inflammatories AS OF TODAY.               THIS INCLUDES IBUPROFEN / MOTRIN / ADVIL / ALEVE / NAPROXYN            YOU MAY CONTINUE TO TAKE TYLENOL   _X___ Stop supplements until after surgery.               THIS INCLUDES MULTIVITAMINS  _X___ Bring C-Pap to the hospital. BRING IT UP TO THE SECOND FLOOR WHEN YOU ARRIVE.  CONTINUE TO TAKE LIPITOR AND SEROQUEL IN THE EVENING AS SCHEDULED.  YOU MAY CONTINUE TO TAKE COLACE BUT NOT ON THE MORNING OF SURGERY.  CONTINUE TO TAKE MICARDIS BUT DO NOT TAKE ON THE MORNING OF SURGERY.   CONTINUE TO TAKE LASIX BUT DO NOT TAKE ON THE MORNING OF SURGERY.  WAIT TO TAKE FOSAMAX UNTIL AFTER SURGERY.

## 2018-02-10 ENCOUNTER — Telehealth: Payer: Self-pay

## 2018-02-10 NOTE — Telephone Encounter (Signed)
Made daughter aware of negative urine culture & plan to proceed with surgery on 02/14/2018. Daughter voices understanding.

## 2018-02-10 NOTE — Telephone Encounter (Signed)
No answer, left vm for pt to call office.

## 2018-02-10 NOTE — Telephone Encounter (Signed)
-----   Message from Abbie Sons, MD sent at 02/07/2018 11:39 AM EDT ----- Urine culture was negative for infection

## 2018-02-14 ENCOUNTER — Encounter
Admission: RE | Disposition: A | Discharge status: Home / Self Care | Payer: Self-pay | Source: Ambulatory Visit | Attending: Urology

## 2018-02-14 ENCOUNTER — Ambulatory Visit: Payer: Medicare Other | Admitting: Anesthesiology

## 2018-02-14 ENCOUNTER — Ambulatory Visit
Admission: RE | Admit: 2018-02-14 | Discharge: 2018-02-14 | Disposition: A | Discharge status: Home / Self Care | Payer: Medicare Other | Source: Ambulatory Visit | Attending: Urology | Admitting: Urology

## 2018-02-14 ENCOUNTER — Other Ambulatory Visit: Payer: Self-pay

## 2018-02-14 DIAGNOSIS — Z886 Allergy status to analgesic agent status: Secondary | ICD-10-CM | POA: Diagnosis not present

## 2018-02-14 DIAGNOSIS — Z85828 Personal history of other malignant neoplasm of skin: Secondary | ICD-10-CM | POA: Insufficient documentation

## 2018-02-14 DIAGNOSIS — Z87442 Personal history of urinary calculi: Secondary | ICD-10-CM | POA: Insufficient documentation

## 2018-02-14 DIAGNOSIS — Z800 Family history of malignant neoplasm of digestive organs: Secondary | ICD-10-CM | POA: Insufficient documentation

## 2018-02-14 DIAGNOSIS — Z841 Family history of disorders of kidney and ureter: Secondary | ICD-10-CM | POA: Insufficient documentation

## 2018-02-14 DIAGNOSIS — N183 Chronic kidney disease, stage 3 (moderate): Secondary | ICD-10-CM | POA: Insufficient documentation

## 2018-02-14 DIAGNOSIS — K219 Gastro-esophageal reflux disease without esophagitis: Secondary | ICD-10-CM | POA: Diagnosis not present

## 2018-02-14 DIAGNOSIS — G309 Alzheimer's disease, unspecified: Secondary | ICD-10-CM | POA: Insufficient documentation

## 2018-02-14 DIAGNOSIS — E039 Hypothyroidism, unspecified: Secondary | ICD-10-CM | POA: Insufficient documentation

## 2018-02-14 DIAGNOSIS — E785 Hyperlipidemia, unspecified: Secondary | ICD-10-CM | POA: Diagnosis not present

## 2018-02-14 DIAGNOSIS — Z794 Long term (current) use of insulin: Secondary | ICD-10-CM | POA: Insufficient documentation

## 2018-02-14 DIAGNOSIS — Z7982 Long term (current) use of aspirin: Secondary | ICD-10-CM | POA: Insufficient documentation

## 2018-02-14 DIAGNOSIS — Z803 Family history of malignant neoplasm of breast: Secondary | ICD-10-CM | POA: Insufficient documentation

## 2018-02-14 DIAGNOSIS — I130 Hypertensive heart and chronic kidney disease with heart failure and stage 1 through stage 4 chronic kidney disease, or unspecified chronic kidney disease: Secondary | ICD-10-CM | POA: Diagnosis not present

## 2018-02-14 DIAGNOSIS — J449 Chronic obstructive pulmonary disease, unspecified: Secondary | ICD-10-CM | POA: Insufficient documentation

## 2018-02-14 DIAGNOSIS — Z9981 Dependence on supplemental oxygen: Secondary | ICD-10-CM | POA: Diagnosis not present

## 2018-02-14 DIAGNOSIS — M109 Gout, unspecified: Secondary | ICD-10-CM | POA: Insufficient documentation

## 2018-02-14 DIAGNOSIS — Z79890 Hormone replacement therapy (postmenopausal): Secondary | ICD-10-CM | POA: Diagnosis not present

## 2018-02-14 DIAGNOSIS — N135 Crossing vessel and stricture of ureter without hydronephrosis: Secondary | ICD-10-CM

## 2018-02-14 DIAGNOSIS — N1330 Unspecified hydronephrosis: Secondary | ICD-10-CM

## 2018-02-14 DIAGNOSIS — E1122 Type 2 diabetes mellitus with diabetic chronic kidney disease: Secondary | ICD-10-CM | POA: Diagnosis not present

## 2018-02-14 DIAGNOSIS — F0280 Dementia in other diseases classified elsewhere without behavioral disturbance: Secondary | ICD-10-CM | POA: Insufficient documentation

## 2018-02-14 DIAGNOSIS — Z79899 Other long term (current) drug therapy: Secondary | ICD-10-CM | POA: Insufficient documentation

## 2018-02-14 DIAGNOSIS — Z96653 Presence of artificial knee joint, bilateral: Secondary | ICD-10-CM | POA: Insufficient documentation

## 2018-02-14 DIAGNOSIS — N130 Hydronephrosis with ureteropelvic junction obstruction: Secondary | ICD-10-CM | POA: Insufficient documentation

## 2018-02-14 DIAGNOSIS — G4730 Sleep apnea, unspecified: Secondary | ICD-10-CM | POA: Insufficient documentation

## 2018-02-14 HISTORY — PX: CYSTOSCOPY W/ URETERAL STENT PLACEMENT: SHX1429

## 2018-02-14 LAB — GLUCOSE, CAPILLARY
GLUCOSE-CAPILLARY: 174 mg/dL — AB (ref 70–99)
Glucose-Capillary: 171 mg/dL — ABNORMAL HIGH (ref 70–99)

## 2018-02-14 SURGERY — CYSTOSCOPY, WITH RETROGRADE PYELOGRAM AND URETERAL STENT INSERTION
Anesthesia: General | Site: Ureter | Laterality: Right | Wound class: Clean Contaminated

## 2018-02-14 MED ORDER — PROPOFOL 10 MG/ML IV BOLUS
INTRAVENOUS | Status: AC
  Filled 2018-02-14: qty 20

## 2018-02-14 MED ORDER — IOTHALAMATE MEGLUMINE 43 % IV SOLN
INTRAVENOUS | Status: DC | PRN
  Administered 2018-02-14: 15 mL via URETHRAL

## 2018-02-14 MED ORDER — CEFAZOLIN SODIUM-DEXTROSE 2-4 GM/100ML-% IV SOLN
2.0000 g | INTRAVENOUS | Status: AC
  Administered 2018-02-14: 2 g via INTRAVENOUS

## 2018-02-14 MED ORDER — PHENYLEPHRINE HCL 10 MG/ML IJ SOLN
INTRAMUSCULAR | Status: DC | PRN
  Administered 2018-02-14: 100 ug via INTRAVENOUS

## 2018-02-14 MED ORDER — FENTANYL CITRATE (PF) 100 MCG/2ML IJ SOLN
INTRAMUSCULAR | Status: AC
  Filled 2018-02-14: qty 2

## 2018-02-14 MED ORDER — HYDROCODONE-ACETAMINOPHEN 5-325 MG PO TABS: 1.0000 | ORAL_TABLET | Freq: Four times a day (QID) | ORAL | 0 refills | Status: DC | PRN

## 2018-02-14 MED ORDER — IPRATROPIUM-ALBUTEROL 0.5-2.5 (3) MG/3ML IN SOLN
RESPIRATORY_TRACT | Status: AC
  Administered 2018-02-14: 3 mL via RESPIRATORY_TRACT
  Filled 2018-02-14: qty 3

## 2018-02-14 MED ORDER — DEXAMETHASONE SODIUM PHOSPHATE 10 MG/ML IJ SOLN
INTRAMUSCULAR | Status: DC | PRN
  Administered 2018-02-14: 5 mg via INTRAVENOUS

## 2018-02-14 MED ORDER — FENTANYL CITRATE (PF) 100 MCG/2ML IJ SOLN
INTRAMUSCULAR | Status: DC | PRN
  Administered 2018-02-14 (×4): 25 ug via INTRAVENOUS

## 2018-02-14 MED ORDER — IPRATROPIUM-ALBUTEROL 0.5-2.5 (3) MG/3ML IN SOLN
3.0000 mL | Freq: Once | RESPIRATORY_TRACT | Status: AC
  Administered 2018-02-14: 3 mL via RESPIRATORY_TRACT

## 2018-02-14 MED ORDER — PROPOFOL 10 MG/ML IV BOLUS
INTRAVENOUS | Status: DC | PRN
  Administered 2018-02-14: 100 mg via INTRAVENOUS

## 2018-02-14 MED ORDER — SODIUM CHLORIDE 0.9 % IV SOLN
INTRAVENOUS | Status: DC
  Administered 2018-02-14: 07:00:00 via INTRAVENOUS

## 2018-02-14 MED ORDER — ONDANSETRON HCL 4 MG/2ML IJ SOLN: 4.0000 mg | Freq: Once | INTRAMUSCULAR | Status: DC | PRN

## 2018-02-14 MED ORDER — CEFAZOLIN SODIUM-DEXTROSE 2-4 GM/100ML-% IV SOLN
INTRAVENOUS | Status: AC
  Filled 2018-02-14: qty 100

## 2018-02-14 MED ORDER — FENTANYL CITRATE (PF) 100 MCG/2ML IJ SOLN: 25.0000 ug | INTRAMUSCULAR | Status: DC | PRN

## 2018-02-14 SURGICAL SUPPLY — 31 items
BAG DRAIN CYSTO-URO LG1000N (MISCELLANEOUS) ×3 IMPLANT
BASKET ZERO TIP 1.9FR (BASKET) IMPLANT
BRUSH SCRUB EZ 1% IODOPHOR (MISCELLANEOUS) ×3 IMPLANT
BSKT STON RTRVL ZERO TP 1.9FR (BASKET)
CATH URETL 5X70 OPEN END (CATHETERS) ×3 IMPLANT
CNTNR SPEC 2.5X3XGRAD LEK (MISCELLANEOUS)
CONRAY 43 FOR UROLOGY 50M (MISCELLANEOUS) ×3 IMPLANT
CONT SPEC 4OZ STER OR WHT (MISCELLANEOUS)
CONT SPEC 4OZ STRL OR WHT (MISCELLANEOUS)
CONTAINER SPEC 2.5X3XGRAD LEK (MISCELLANEOUS) IMPLANT
DRAPE UTILITY 15X26 TOWEL STRL (DRAPES) ×3 IMPLANT
FIBER LASER LITHO 273 (Laser) IMPLANT
GLOVE BIO SURGEON STRL SZ8 (GLOVE) ×3 IMPLANT
GOWN STRL REUS W/ TWL LRG LVL3 (GOWN DISPOSABLE) ×2 IMPLANT
GOWN STRL REUS W/TWL LRG LVL3 (GOWN DISPOSABLE) ×6
INFUSOR MANOMETER BAG 3000ML (MISCELLANEOUS) IMPLANT
INTRODUCER DILATOR DOUBLE (INTRODUCER) IMPLANT
KIT TURNOVER CYSTO (KITS) ×3 IMPLANT
PACK CYSTO AR (MISCELLANEOUS) ×3 IMPLANT
SENSORWIRE 0.038 NOT ANGLED (WIRE) ×3
SET CYSTO W/LG BORE CLAMP LF (SET/KITS/TRAYS/PACK) ×3 IMPLANT
SHEATH URETERAL 12FRX35CM (MISCELLANEOUS) IMPLANT
SOL .9 NS 3000ML IRR  AL (IV SOLUTION) ×2
SOL .9 NS 3000ML IRR AL (IV SOLUTION) ×1
SOL .9 NS 3000ML IRR UROMATIC (IV SOLUTION) ×1 IMPLANT
STENT URET 6FRX24 CONTOUR (STENTS) IMPLANT
STENT URET 6FRX26 CONTOUR (STENTS) IMPLANT
STENT URO INLAY 6FRX22CM (STENTS) ×3 IMPLANT
SURGILUBE 2OZ TUBE FLIPTOP (MISCELLANEOUS) ×3 IMPLANT
WATER STERILE IRR 1000ML POUR (IV SOLUTION) ×3 IMPLANT
WIRE SENSOR 0.038 NOT ANGLED (WIRE) ×1 IMPLANT

## 2018-02-14 NOTE — Interval H&P Note (Signed)
History and Physical Interval Note:  02/14/2018 7:26 AM  Lori Blanchard  has presented today for surgery, with the diagnosis of right UPJ obstruction  The various methods of treatment have been discussed with the patient and family. After consideration of risks, benefits and other options for treatment, the patient has consented to  Procedure(s): CYSTOSCOPY WITH RETROGRADE PYELOGRAM/URETERAL STENT Exchange (Right) as a surgical intervention .  The patient's history has been reviewed, patient examined, no change in status, stable for surgery.  I have reviewed the patient's chart and labs.  Questions were answered to the patient's satisfaction.     [place]

## 2018-02-14 NOTE — Anesthesia Post-op Follow-up Note (Signed)
Anesthesia QCDR form completed.        

## 2018-02-14 NOTE — Anesthesia Postprocedure Evaluation (Signed)
Anesthesia Post Note  Patient: Lori Blanchard  Procedure(s) Performed: CYSTOSCOPY WITH RETROGRADE PYELOGRAM/URETERAL STENT Exchange (Right Ureter)  Patient location during evaluation: PACU Anesthesia Type: General Level of consciousness: awake and alert and oriented Pain management: pain level controlled Vital Signs Assessment: post-procedure vital signs reviewed and stable Respiratory status: spontaneous breathing Cardiovascular status: blood pressure returned to baseline Anesthetic complications: no     Last Vitals:  Vitals:   02/14/18 0914 02/14/18 0934  BP: 140/67 (!) 142/71  Pulse: 89 87  Resp: 20 20  Temp: (!) 36.4 C (!) 36.2 C  SpO2: 94% 95%    Last Pain:  Vitals:   02/14/18 0934  TempSrc: Temporal  PainSc: 0-No pain                 Drishti Pepperman

## 2018-02-14 NOTE — OR Nursing (Signed)
Dr. Kayleen Memos ordered a Duo neb

## 2018-02-14 NOTE — Anesthesia Preprocedure Evaluation (Addendum)
Anesthesia Evaluation  Patient identified by MRN, date of birth, ID band Patient awake    Reviewed: Allergy & Precautions, NPO status , Patient's Chart, lab work & pertinent test results  Airway Mallampati: III       Dental  (+) Teeth Intact   Pulmonary shortness of breath and with exertion, asthma , sleep apnea and Oxygen sleep apnea , COPD,    breath sounds clear to auscultation       Cardiovascular Exercise Tolerance: Poor hypertension, Pt. on medications +CHF   Rhythm:Regular Rate:Normal     Neuro/Psych PSYCHIATRIC DISORDERS Anxiety Depression    GI/Hepatic negative GI ROS, GERD  ,Patient received Oral Contrast Agents,  Endo/Other  diabetesHypothyroidism   Renal/GU Renal InsufficiencyRenal disease     Musculoskeletal  (+) Arthritis , Osteoarthritis,    Abdominal (+) + obese,   Peds  Hematology   Anesthesia Other Findings   Reproductive/Obstetrics                                                              Anesthesia Evaluation  Patient identified by MRN, date of birth, ID band Patient awake    Reviewed: Allergy & Precautions, H&P , NPO status , Patient's Chart, lab work & pertinent test results  History of Anesthesia Complications (+) Family history of anesthesia reactionNegative for: history of anesthetic complications  Airway Mallampati: III  TM Distance: <3 FB Neck ROM: limited    Dental  (+) Poor Dentition, Chipped, Missing   Pulmonary shortness of breath and with exertion, asthma , sleep apnea and Continuous Positive Airway Pressure Ventilation , COPD, neg recent URI,           Cardiovascular Exercise Tolerance: Good hypertension, (-) angina(-) Past MI      Neuro/Psych PSYCHIATRIC DISORDERS Anxiety Depression negative neurological ROS     GI/Hepatic Neg liver ROS, GERD  ,  Endo/Other  negative endocrine ROSdiabetes, Well Controlled, Type  2Hypothyroidism   Renal/GU Renal disease     Musculoskeletal  (+) Arthritis ,   Abdominal   Peds  Hematology negative hematology ROS (+)   Anesthesia Other Findings Past Medical History: No date: Alzheimer disease No date: Anxiety and depression No date: Asthma No date: COPD (chronic obstructive pulmonary disease) (* No date: Depression No date: Diabetes ([place]) No date: Family history of adverse reaction to anesthes*     Comment: daughter has breathing problems after               anesthesia No date: Gout No date: History of kidney stones No date: Hydronephrosis No date: Hyperlipemia No date: Hypertension No date: Hypothyroidism No date: Osteoarthritis No date: Osteoarthritis No date: Pulmonary nodule 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 No date: Sleep apnea  Past Surgical History: No date: ABDOMINAL HYSTERECTOMY     Comment: partial 1965: APPENDECTOMY 1990: BREAST SURGERY Bilateral     Comment: Reduction No date: CARPAL TUNNEL RELEASE No date: CESAREAN SECTION     Comment: x 3 11/02/2015: CYSTOSCOPY W/ RETROGRADES Right     Comment: Procedure: CYSTOSCOPY WITH RETROGRADE               PYELOGRAM, URETERAL STENT EXCHANGE;  Surgeon:               [name], MD;  Location: [facility];  Service: Urology;  Laterality: Right; 01/31/2016: CYSTOSCOPY W/ RETROGRADES Right     Comment: Procedure: CYSTOSCOPY WITH RETROGRADE               PYELOGRAM;  Surgeon: [name], MD;                Location: [facility];  Service: Urology;                Laterality: Right; 05/09/2016: CYSTOSCOPY W/ RETROGRADES Bilateral     Comment: Procedure: CYSTOSCOPY WITH RETROGRADE               PYELOGRAM;  Surgeon: [name], MD;                Location: [facility];  Service: Urology;                Laterality: Bilateral; 05/11/2015: CYSTOSCOPY W/ URETERAL STENT PLACEMENT Right     Comment: Procedure: CYSTOSCOPY WITH STENT REPLACEMENT;               Surgeon:  [name], MD;  Location:               [facility];  Service: Urology;  Laterality:               Right; 01/31/2016: CYSTOSCOPY W/ URETERAL STENT PLACEMENT Right     Comment: Procedure: CYSTOSCOPY, RETROGRADE PYELOGRAMS               WITH STENT REPLACEMENT;  Surgeon: [name], MD;  Location: [facility];  Service:               Urology;  Laterality: Right; 05/09/2016: CYSTOSCOPY W/ URETERAL STENT PLACEMENT Right     Comment: Procedure: CYSTOSCOPY WITH STENT REPLACEMENT;               Surgeon: [name], MD;  Location:               [facility];  Service: Urology;  Laterality:               Right; 07/04/2016: CYSTOSCOPY W/ URETERAL STENT PLACEMENT Bilateral     Comment: Procedure: CYSTOSCOPY WITH STENT REPLACEMENT;               Surgeon: [name], MD;  Location:               [facility];  Service: Urology;  Laterality:               Bilateral; 05/09/2016: CYSTOSCOPY WITH STENT PLACEMENT Left     Comment: Procedure: CYSTOSCOPY WITH STENT PLACEMENT;                Surgeon: [name], MD;  Location:               [facility];  Service: Urology;  Laterality: Left; No date: HERNIA REPAIR     Comment: umbilical 5093: JOINT REPLACEMENT Left     Comment: TKR 2011: JOINT REPLACEMENT Right     Comment: TKR No date: LAPAROSCOPIC HYSTERECTOMY No date: REDUCTION MAMMAPLASTY No date: REPLACEMENT TOTAL KNEE Bilateral 05/09/2016: URETEROSCOPY Bilateral     Comment: Procedure: URETEROSCOPY;  Surgeon: [name], MD;  Location: [facility];  Service:  Urology;  Laterality: Bilateral; 07/04/2016: URETEROSCOPY WITH HOLMIUM LASER LITHOTRIPSY Left     Comment: Procedure: URETEROSCOPY WITH HOLMIUM LASER               LITHOTRIPSY;  Surgeon: [name], MD;               Location: [facility];  Service: Urology;                Laterality: Left;  BMI    Body Mass Index:  34.19 kg/m       Reproductive/Obstetrics negative OB ROS                             Anesthesia Physical  Anesthesia Plan  ASA: III  Anesthesia Plan: General LMA   Post-op Pain Management:    Induction: Intravenous  PONV Risk Score and Plan: 3 and Ondansetron, Dexamethasone and Treatment may vary due to age or medical condition  Airway Management Planned: LMA  Additional Equipment:   Intra-op Plan:   Post-operative Plan:   Informed Consent: I have reviewed the patients History and Physical, chart, labs and discussed the procedure including the risks, benefits and alternatives for the proposed anesthesia with the patient or authorized representative who has indicated his/her understanding and acceptance.   Dental Advisory Given  Plan Discussed with: Anesthesiologist, CRNA and Surgeon  Anesthesia Plan Comments:         Anesthesia Quick Evaluation  Anesthesia Physical  Anesthesia Plan  ASA: III  Anesthesia Plan: General   Post-op Pain Management:    Induction: Intravenous  PONV Risk Score and Plan:   Airway Management Planned: Oral ETT  Additional Equipment:   Intra-op Plan:   Post-operative Plan: Extubation in OR  Informed Consent: I have reviewed the patients History and Physical, chart, labs and discussed the procedure including the risks, benefits and alternatives for the proposed anesthesia with the patient or authorized representative who has indicated his/her understanding and acceptance.     Plan Discussed with: CRNA  Anesthesia Plan Comments:         Anesthesia Quick Evaluation

## 2018-02-14 NOTE — Discharge Instructions (Signed)

## 2018-02-14 NOTE — Anesthesia Procedure Notes (Signed)
Procedure Name: LMA Insertion Performed by: Lorie Apley, CRNA Pre-anesthesia Checklist: Patient identified, Patient being monitored, Timeout performed, Emergency Drugs available and Suction available Patient Re-evaluated:Patient Re-evaluated prior to induction Oxygen Delivery Method: Circle system utilized Preoxygenation: Pre-oxygenation with 100% oxygen Induction Type: IV induction Ventilation: Mask ventilation without difficulty LMA: LMA inserted LMA Size: 4.0 Tube type: Oral Number of attempts: 1 Placement Confirmation: positive ETCO2 and breath sounds checked- equal and bilateral Tube secured with: Tape Dental Injury: Teeth and Oropharynx as per pre-operative assessment

## 2018-02-14 NOTE — Op Note (Signed)
Preoperative diagnosis:  1. Right UPJ obstruction  Postoperative diagnosis:  1. Right UPJ obstruction  Procedure: 1. Cystoscopy with removal of right ureteral stent 2. Right retrograde pyelogram with interpretation 3. Exchange of right ureteral stent  Surgeon: Abbie Sons, MD  Anesthesia: General  Complications: None  Intraoperative findings:  Right retrograde pyelogram: Moderate right hydronephrosis with narrowing at the UPJ.  The ureter was normal in appearance  EBL: Minimal  Specimens: None  Indication: COSANDRA PLOUFFE is a 72 y.o. 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  She is felt to be a poor surgical candidate and has managed with ureteral stent drainage.  She presents today for stent exchange.  After reviewing the management options for treatment, he elected to proceed with the above surgical procedure(s). We have discussed the potential benefits and risks of the procedure, side effects of the proposed treatment, the likelihood of the patient achieving the goals of the procedure, and any potential problems that might occur during the procedure or recuperation. Informed consent has been obtained.  Description of procedure:  The patient was taken to the operating room and general anesthesia was induced.  The patient was placed in the dorsal lithotomy position, prepped and draped in the usual sterile fashion, and preoperative antibiotics were administered. A preoperative time-out was performed.   A 22 French cystoscope was lubricated and passed per urethra.  Panendoscopy was performed and the bladder mucosa was normal in appearance without erythema, solid or papillary lesions.  The stent was grasped with endoscopic forceps and brought out to the urethral meatus.  No encrustation was noted.  A 0.038 Sensor wire was easily placed through the stent up to the renal pelvis.  The ureteral stent was removed.  A 6 French open-ended catheter  was placed over the wire and after removing the guidewire right retrograde pyelogram was performed with findings as described above.  The guidewire was replaced and the ureteral catheter was removed.  The guidewire was then backloaded on the cystoscope and a 6 French/22 cm Bard Optima ureteral stent was placed without difficulty.  There was good curl seen in the renal pelvis under fluoroscopy and in the bladder under direct vision.  The bladder was emptied and the cystoscope was removed.  After anesthetic reversal the patient was transported to the PACU in stable condition.  Plan: Will tentatively plan on stent exchange in 6 months since no significant encrustation was noted after 4 months.   Abbie Sons, M.D.

## 2018-02-14 NOTE — H&P (Signed)
02/14/2018 7:23 AM   Lori Blanchard [birth year] [id number]  Referring provider: No referring provider defined for this encounter.   HPI: Lori Blanchard is a 72 y.o. 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  She is felt to be a poor surgical candidate and has managed with ureteral stent drainage.  Her stent was last exchanged in March 2019 and she presents today for same.  She has no complaints.  Preoperative urine culture was negative.  PMH: Past Medical History:  Diagnosis Date  . Alzheimer disease   . Anxiety   . Anxiety and depression   . Asthma   . Cancer ([place])    Basal Cell Skin Cancer  . CHF (congestive heart failure) ([place])    patient denies this diagnosis  . COPD (chronic obstructive pulmonary disease) ([place])   . Depression   . Diabetes ([place])   . Diverticulosis    patient denies problems for this  . Dyspnea   . Elevated lipids   . Fatty liver   . GERD (gastroesophageal reflux disease)   . Gout    no medication at this time  . History of kidney stones   . Hydronephrosis    stage III ckd  . Hyperlipemia   . Hypertension   . Hypothyroidism   . Lower extremity edema    comes and goes  . Osteoarthritis   . Osteoarthritis   . Osteoporosis   . Pulmonary nodule    has been followed for years, not cancerous. causes breathing issues  . Retroperitoneal fibrosis   . Sleep apnea    CPAP  . Ureter injury   . Ureteropelvic junction (UPJ) obstruction    requires change of ureteral stent approximately every 2 to 3 months    Surgical History: Past Surgical History:  Procedure Laterality Date  . ABDOMINAL HYSTERECTOMY     partial  . ANKLE FRACTURE SURGERY Left    pins in ankle but not in correct location  . APPENDECTOMY  1965  . BREAST SURGERY Bilateral 1990   Reduction  . c setion    . CARPAL TUNNEL RELEASE Bilateral 2000  . CESAREAN SECTION  [id number], 1969   x 3  . CYSTOSCOPY W/ RETROGRADES Right 11/02/2015   Procedure:  CYSTOSCOPY WITH RETROGRADE PYELOGRAM, URETERAL STENT EXCHANGE;  Surgeon: [name], MD;  Location: [facility];  Service: Urology;  Laterality: Right;  . CYSTOSCOPY W/ RETROGRADES Right 01/31/2016   Procedure: CYSTOSCOPY WITH RETROGRADE PYELOGRAM;  Surgeon: [name], MD;  Location: [facility];  Service: Urology;  Laterality: Right;  . CYSTOSCOPY W/ RETROGRADES Bilateral 05/09/2016   Procedure: CYSTOSCOPY WITH RETROGRADE PYELOGRAM;  Surgeon: [name], MD;  Location: [facility];  Service: Urology;  Laterality: Bilateral;  . CYSTOSCOPY W/ RETROGRADES Left 11/21/2016   Procedure: CYSTOSCOPY WITH RETROGRADE PYELOGRAM;  Surgeon: [name], MD;  Location: [facility];  Service: Urology;  Laterality: Left;  . CYSTOSCOPY W/ RETROGRADES Right 03/27/2017   Procedure: CYSTOSCOPY WITH RETROGRADE PYELOGRAM;  Surgeon: [name], MD;  Location: [facility];  Service: Urology;  Laterality: Right;  . CYSTOSCOPY W/ RETROGRADES Bilateral 05/31/2017   Procedure: CYSTOSCOPY WITH RETROGRADE PYELOGRAM;  Surgeon: [name], MD;  Location: [facility];  Service: Urology;  Laterality: Bilateral;  . CYSTOSCOPY W/ URETERAL STENT PLACEMENT Right 05/11/2015   Procedure: CYSTOSCOPY WITH STENT REPLACEMENT;  Surgeon: [name], MD;  Location: [facility];  Service: Urology;  Laterality: Right;  . CYSTOSCOPY W/  URETERAL STENT PLACEMENT Right 01/31/2016   Procedure: CYSTOSCOPY, RETROGRADE PYELOGRAMS WITH STENT REPLACEMENT;  Surgeon: [name], MD;  Location: [facility];  Service: Urology;  Laterality: Right;  . CYSTOSCOPY W/ URETERAL STENT PLACEMENT Right 05/09/2016   Procedure: CYSTOSCOPY WITH STENT REPLACEMENT;  Surgeon: [name], MD;  Location: [facility];  Service: Urology;  Laterality: Right;  . CYSTOSCOPY W/ URETERAL STENT PLACEMENT Bilateral 07/04/2016   Procedure: CYSTOSCOPY WITH STENT REPLACEMENT;  Surgeon: [name], MD;  Location: [facility];  Service: Urology;   Laterality: Bilateral;  . CYSTOSCOPY W/ URETERAL STENT PLACEMENT Right 09/14/2016   Procedure: CYSTOSCOPY WITH STENT REPLACEMENT;  Surgeon: [name], MD;  Location: [facility];  Service: Urology;  Laterality: Right;  . CYSTOSCOPY W/ URETERAL STENT PLACEMENT Right 11/21/2016   Procedure: CYSTOSCOPY WITH STENT REPLACEMENT;  Surgeon: [name], MD;  Location: [facility];  Service: Urology;  Laterality: Right;  . CYSTOSCOPY W/ URETERAL STENT PLACEMENT Right 01/25/2017   Procedure: CYSTOSCOPY WITH STENT REPLACEMENT;  Surgeon: [name], MD;  Location: [facility];  Service: Urology;  Laterality: Right;  . CYSTOSCOPY W/ URETERAL STENT PLACEMENT Right 03/27/2017   Procedure: CYSTOSCOPY WITH STENT REPLACEMENT;  Surgeon: [name], MD;  Location: [facility];  Service: Urology;  Laterality: Right;  . CYSTOSCOPY W/ URETERAL STENT PLACEMENT Right 05/31/2017   Procedure: CYSTOSCOPY WITH STENT REPLACEMENT;  Surgeon: [name], MD;  Location: [facility];  Service: Urology;  Laterality: Right;  . CYSTOSCOPY W/ URETERAL STENT PLACEMENT Right 08/02/2017   Procedure: CYSTOSCOPY WITH STENT REPLACEMENT;  Surgeon: [name], MD;  Location: [facility];  Service: Urology;  Laterality: Right;  . CYSTOSCOPY W/ URETERAL STENT PLACEMENT Right 10/29/2017   Procedure: CYSTOSCOPY WITH STENT REPLACEMENT;  Surgeon: Abbie Sons, MD;  Location: [facility];  Service: Urology;  Laterality: Right;  . CYSTOSCOPY W/ URETERAL STENT REMOVAL Left 09/14/2016   Procedure: CYSTOSCOPY WITH STENT REMOVAL;  Surgeon: [name], MD;  Location: [facility];  Service: Urology;  Laterality: Left;  . CYSTOSCOPY WITH STENT PLACEMENT Left 05/09/2016   Procedure: CYSTOSCOPY WITH STENT PLACEMENT;  Surgeon: [name], MD;  Location: [facility];  Service: Urology;  Laterality: Left;  . EYE SURGERY Left 2018   tear duct reconstruction  . HERNIA REPAIR  1540   umbilical  . JOINT REPLACEMENT Left 2013     TKR  . JOINT REPLACEMENT Right 2011   TKR  . LAPAROSCOPIC HYSTERECTOMY    . REDUCTION MAMMAPLASTY    . REPLACEMENT TOTAL KNEE Bilateral   . SKIN CANCER EXCISION Left 2018   lower eye lid  . URETEROSCOPY Bilateral 05/09/2016   Procedure: URETEROSCOPY;  Surgeon: [name], MD;  Location: [facility];  Service: Urology;  Laterality: Bilateral;  . URETEROSCOPY WITH HOLMIUM LASER LITHOTRIPSY Left 07/04/2016   Procedure: URETEROSCOPY WITH HOLMIUM LASER LITHOTRIPSY;  Surgeon: [name], MD;  Location: [facility];  Service: Urology;  Laterality: Left;    Home Medications:  Reviewed  Allergies:  Allergies  Allergen Reactions  . Ibuprofen Itching, Nausea Only and Rash    Family History: Family History  Problem Relation Age of Onset  . Liver cancer Mother   . Colon cancer Mother   . Breast cancer Mother 38  . Diabetes Daughter   . Kidney disease Daughter        adrenal tumors  . Kidney cancer Neg Hx   . Prostate cancer Neg Hx  Social History:  reports that she has never smoked. She has never used smokeless tobacco. She reports that she does not drink alcohol or use drugs.  ROS: Denies fever, chills, chest pain  Physical Exam: BP (!) 159/82   Pulse 97   Temp 98.2 F (36.8 C) (Tympanic)   Resp 14   Ht 4\' 8"  (1.422 m)   Wt 161 lb (73 kg)   SpO2 95%   BMI 36.10 kg/m   Constitutional:  Alert and oriented, No acute distress. HEENT: [place] AT, moist mucus membranes.  Trachea midline, no masses. Cardiovascular: No clubbing, cyanosis, or edema.  RRR Respiratory: Normal respiratory effort, no increased work of breathing.  Lungs clear GI: Abdomen is soft, nontender, nondistended, no abdominal masses GU: No CVA tenderness Lymph: No cervical or inguinal lymphadenopathy. Skin: No rashes, bruises or suspicious lesions. Neurologic: Grossly intact, no focal deficits, moving all 4 extremities. Psychiatric: Normal mood and affect.  Laboratory Data:   Assessment &  Plan:   72 year old female with chronic right UPJ obstruction who presents for stent exchange.  The procedure has been discussed in detail including potential risks.  All questions were answered and she desires to proceed.   Abbie Sons, [place] [address] [place], [place] [address] [phone]

## 2018-02-14 NOTE — OR Nursing (Signed)
Patient using spirometer here in Pacu patient uses CPAP at home

## 2018-02-14 NOTE — Transfer of Care (Signed)
Immediate Anesthesia Transfer of Care Note  Patient: Lori Blanchard  Procedure(s) Performed: CYSTOSCOPY WITH RETROGRADE PYELOGRAM/URETERAL STENT Exchange (Right Ureter)  Patient Location: PACU  Anesthesia Type:General  Level of Consciousness: awake, alert  and oriented  Airway & Oxygen Therapy: Patient Spontanous Breathing and Patient connected to face mask  Post-op Assessment: Report given to RN, Post -op Vital signs reviewed and stable and Patient moving all extremities X 4  Post vital signs: Reviewed and stable  Last Vitals:  Vitals Value Taken Time  BP 154/85 02/14/2018  8:13 AM  Temp    Pulse 95 02/14/2018  8:14 AM  Resp 19 02/14/2018  8:14 AM  SpO2 98 % 02/14/2018  8:14 AM  Vitals shown include unvalidated device data.  Last Pain:  Vitals:   02/14/18 0634  TempSrc: Tympanic  PainSc: 0-No pain         Complications: No apparent anesthesia complications

## 2018-04-14 ENCOUNTER — Encounter: Payer: Self-pay | Admitting: Urology

## 2018-04-14 ENCOUNTER — Ambulatory Visit (INDEPENDENT_AMBULATORY_CARE_PROVIDER_SITE_OTHER): Payer: Medicare Other | Admitting: Urology

## 2018-04-14 VITALS — BP 152/80 | HR 101 | Ht <= 58 in | Wt 164.2 lb

## 2018-04-14 DIAGNOSIS — N135 Crossing vessel and stricture of ureter without hydronephrosis: Secondary | ICD-10-CM | POA: Diagnosis not present

## 2018-04-14 NOTE — Progress Notes (Signed)
04/14/2018 2:37 PM   Lori Blanchard 02/08/1946 [id number]  Referring provider: [name], MD [facility] [place], [place] [address]  Chief Complaint  Patient presents with  . Routine Post Op    HPI: 72 year old female with a chronic right UPJ obstruction that is being managed with chronic stent drainage.  Her stent was last changed 02/14/2018.  She had no postoperative problems and has no complaints today.   PMH: Past Medical History:  Diagnosis Date  . Alzheimer disease   . Anxiety   . Anxiety and depression   . Asthma   . Cancer ([place])    Basal Cell Skin Cancer  . CHF (congestive heart failure) ([place])    patient denies this diagnosis  . COPD (chronic obstructive pulmonary disease) ([place])   . Depression   . Diabetes ([place])   . Diverticulosis    patient denies problems for this  . Dyspnea   . Elevated lipids   . Fatty liver   . GERD (gastroesophageal reflux disease)   . Gout    no medication at this time  . History of kidney stones   . Hydronephrosis    stage III ckd  . Hyperlipemia   . Hypertension   . Hypothyroidism   . Lower extremity edema    comes and goes  . Osteoarthritis   . Osteoarthritis   . Osteoporosis   . Pulmonary nodule    has been followed for years, not cancerous. causes breathing issues  . Retroperitoneal fibrosis   . Sleep apnea    CPAP  . Ureter injury   . Ureteropelvic junction (UPJ) obstruction    requires change of ureteral stent approximately every 2 to 3 months    Surgical History: Past Surgical History:  Procedure Laterality Date  . ABDOMINAL HYSTERECTOMY     partial  . ANKLE FRACTURE SURGERY Left    pins in ankle but not in correct location  . APPENDECTOMY  1965  . BREAST SURGERY Bilateral 1990   Reduction  . c setion    . CARPAL TUNNEL RELEASE Bilateral 2000  . CESAREAN SECTION  1443,1540, 1969   x 3  . CYSTOSCOPY W/ RETROGRADES Right 11/02/2015   Procedure: CYSTOSCOPY WITH  RETROGRADE PYELOGRAM, URETERAL STENT EXCHANGE;  Surgeon: [name], MD;  Location: [facility];  Service: Urology;  Laterality: Right;  . CYSTOSCOPY W/ RETROGRADES Right 01/31/2016   Procedure: CYSTOSCOPY WITH RETROGRADE PYELOGRAM;  Surgeon: [name], MD;  Location: [facility];  Service: Urology;  Laterality: Right;  . CYSTOSCOPY W/ RETROGRADES Bilateral 05/09/2016   Procedure: CYSTOSCOPY WITH RETROGRADE PYELOGRAM;  Surgeon: [name], MD;  Location: [facility];  Service: Urology;  Laterality: Bilateral;  . CYSTOSCOPY W/ RETROGRADES Left 11/21/2016   Procedure: CYSTOSCOPY WITH RETROGRADE PYELOGRAM;  Surgeon: [name], MD;  Location: [facility];  Service: Urology;  Laterality: Left;  . CYSTOSCOPY W/ RETROGRADES Right 03/27/2017   Procedure: CYSTOSCOPY WITH RETROGRADE PYELOGRAM;  Surgeon: [name], MD;  Location: [facility];  Service: Urology;  Laterality: Right;  . CYSTOSCOPY W/ RETROGRADES Bilateral 05/31/2017   Procedure: CYSTOSCOPY WITH RETROGRADE PYELOGRAM;  Surgeon: [name], MD;  Location: [facility];  Service: Urology;  Laterality: Bilateral;  . CYSTOSCOPY W/ URETERAL STENT PLACEMENT Right 05/11/2015   Procedure: CYSTOSCOPY WITH STENT REPLACEMENT;  Surgeon: [name], MD;  Location: [facility];  Service: Urology;  Laterality: Right;  . CYSTOSCOPY W/ URETERAL STENT PLACEMENT Right  01/31/2016   Procedure: CYSTOSCOPY, RETROGRADE PYELOGRAMS WITH STENT REPLACEMENT;  Surgeon: [name], MD;  Location: [facility];  Service: Urology;  Laterality: Right;  . CYSTOSCOPY W/ URETERAL STENT PLACEMENT Right 05/09/2016   Procedure: CYSTOSCOPY WITH STENT REPLACEMENT;  Surgeon: [name], MD;  Location: [facility];  Service: Urology;  Laterality: Right;  . CYSTOSCOPY W/ URETERAL STENT PLACEMENT Bilateral 07/04/2016   Procedure: CYSTOSCOPY WITH STENT REPLACEMENT;  Surgeon: [name], MD;  Location: [facility];  Service: Urology;  Laterality:  Bilateral;  . CYSTOSCOPY W/ URETERAL STENT PLACEMENT Right 09/14/2016   Procedure: CYSTOSCOPY WITH STENT REPLACEMENT;  Surgeon: [name], MD;  Location: [facility];  Service: Urology;  Laterality: Right;  . CYSTOSCOPY W/ URETERAL STENT PLACEMENT Right 11/21/2016   Procedure: CYSTOSCOPY WITH STENT REPLACEMENT;  Surgeon: [name], MD;  Location: [facility];  Service: Urology;  Laterality: Right;  . CYSTOSCOPY W/ URETERAL STENT PLACEMENT Right 01/25/2017   Procedure: CYSTOSCOPY WITH STENT REPLACEMENT;  Surgeon: [name], MD;  Location: [facility];  Service: Urology;  Laterality: Right;  . CYSTOSCOPY W/ URETERAL STENT PLACEMENT Right 03/27/2017   Procedure: CYSTOSCOPY WITH STENT REPLACEMENT;  Surgeon: [name], MD;  Location: [facility];  Service: Urology;  Laterality: Right;  . CYSTOSCOPY W/ URETERAL STENT PLACEMENT Right 05/31/2017   Procedure: CYSTOSCOPY WITH STENT REPLACEMENT;  Surgeon: [name], MD;  Location: [facility];  Service: Urology;  Laterality: Right;  . CYSTOSCOPY W/ URETERAL STENT PLACEMENT Right 08/02/2017   Procedure: CYSTOSCOPY WITH STENT REPLACEMENT;  Surgeon: [name], MD;  Location: [facility];  Service: Urology;  Laterality: Right;  . CYSTOSCOPY W/ URETERAL STENT PLACEMENT Right 10/29/2017   Procedure: CYSTOSCOPY WITH STENT REPLACEMENT;  Surgeon: Abbie Sons, MD;  Location: [facility];  Service: Urology;  Laterality: Right;  . CYSTOSCOPY W/ URETERAL STENT PLACEMENT Right 02/14/2018   Procedure: CYSTOSCOPY WITH RETROGRADE PYELOGRAM/URETERAL STENT Exchange;  Surgeon: Abbie Sons, MD;  Location: [facility];  Service: Urology;  Laterality: Right;  . CYSTOSCOPY W/ URETERAL STENT REMOVAL Left 09/14/2016   Procedure: CYSTOSCOPY WITH STENT REMOVAL;  Surgeon: [name], MD;  Location: [facility];  Service: Urology;  Laterality: Left;  . CYSTOSCOPY WITH STENT PLACEMENT Left 05/09/2016   Procedure: CYSTOSCOPY WITH STENT PLACEMENT;   Surgeon: [name], MD;  Location: [facility];  Service: Urology;  Laterality: Left;  . EYE SURGERY Left 2018   tear duct reconstruction  . HERNIA REPAIR  3559   umbilical  . JOINT REPLACEMENT Left 2013   TKR  . JOINT REPLACEMENT Right 2011   TKR  . LAPAROSCOPIC HYSTERECTOMY    . REDUCTION MAMMAPLASTY    . REPLACEMENT TOTAL KNEE Bilateral   . SKIN CANCER EXCISION Left 2018   lower eye lid  . URETEROSCOPY Bilateral 05/09/2016   Procedure: URETEROSCOPY;  Surgeon: [name], MD;  Location: [facility];  Service: Urology;  Laterality: Bilateral;  . URETEROSCOPY WITH HOLMIUM LASER LITHOTRIPSY Left 07/04/2016   Procedure: URETEROSCOPY WITH HOLMIUM LASER LITHOTRIPSY;  Surgeon: [name], MD;  Location: [facility];  Service: Urology;  Laterality: Left;    Home Medications:  Allergies as of 04/14/2018      Reactions   Ibuprofen Itching, Nausea Only, Rash      Medication List        Accurate as of 04/14/18  2:37 PM. Always use your most recent med list.  albuterol 108 (90 Base) MCG/ACT inhaler Commonly known as:  PROVENTIL HFA;VENTOLIN HFA Inhale 2 puffs into the lungs every 4 (four) hours as needed for wheezing or shortness of breath.   albuterol (2.5 MG/3ML) 0.083% nebulizer solution Commonly known as:  PROVENTIL Inhale 2.5 mg into the lungs every 4 (four) hours as needed for wheezing or shortness of breath.   alendronate 70 MG tablet Commonly known as:  FOSAMAX Take 70 mg by mouth once weekly on Friday   aspirin EC 81 MG tablet Take 81 mg by mouth daily.   atorvastatin 40 MG tablet Commonly known as:  LIPITOR Take 40 mg by mouth daily.   buPROPion 150 MG 24 hr tablet Commonly known as:  WELLBUTRIN XL Take 150 mg by mouth daily.   furosemide 20 MG tablet Commonly known as:  LASIX Take 20 mg by mouth daily.   gabapentin 100 MG capsule Commonly known as:  NEURONTIN Take 100 mg by mouth 2 (two) times daily.   HYDROcodone-acetaminophen 5-325  MG tablet Commonly known as:  NORCO/VICODIN Take 1 tablet by mouth every 6 (six) hours as needed for moderate pain.   insulin lispro 100 UNIT/ML KiwkPen Commonly known as:  HUMALOG Inject 12 Units into the skin 3 (three) times daily before meals.   LANTUS SOLOSTAR 100 UNIT/ML Solostar Pen Generic drug:  Insulin Glargine Inject 25 Units into the skin daily at 10 pm.   levothyroxine 75 MCG tablet Commonly known as:  SYNTHROID, LEVOTHROID Take 75 mcg by mouth daily before breakfast.   metFORMIN 1000 MG tablet Commonly known as:  GLUCOPHAGE Take 1,000 mg by mouth 2 (two) times daily with a meal.   multivitamin with minerals Tabs tablet Take 1 tablet by mouth daily.   naproxen sodium 220 MG tablet Commonly known as:  ALEVE Take 220 mg by mouth 2 (two) times daily as needed (for pain or headache).   ONE TOUCH ULTRA TEST test strip Generic drug:  glucose blood Use 3 (three) times daily. Use as instructed.   ONETOUCH DELICA LANCETS FINE Misc USE 1 LANCET 2 TO 3 TIMES DAILY TO CHECK BLOOD SUGAR   OXYGEN Inhale 2 L into the lungs at bedtime as needed (with CPAP).   pantoprazole 40 MG tablet Commonly known as:  PROTONIX Take 40 mg by mouth daily.   QUEtiapine 25 MG tablet Commonly known as:  SEROQUEL Take 25 mg by mouth at bedtime.   SURE COMFORT PEN NEEDLES 31G X 5 MM Misc Generic drug:  Insulin Pen Needle USE AS DIRECTED   telmisartan 40 MG tablet Commonly known as:  MICARDIS TAKE 1 TABLET (40 MG) BY MOUTH DAILY IN THE MORNING       Allergies:  Allergies  Allergen Reactions  . Ibuprofen Itching, Nausea Only and Rash    Family History: Family History  Problem Relation Age of Onset  . Liver cancer Mother   . Colon cancer Mother   . Breast cancer Mother 31  . Diabetes Daughter   . Kidney disease Daughter        adrenal tumors  . Kidney cancer Neg Hx   . Prostate cancer Neg Hx     Social History:  reports that she has never smoked. She has never used  smokeless tobacco. She reports that she does not drink alcohol or use drugs.  ROS: UROLOGY Frequent Urination?: No Hard to postpone urination?: No Burning/pain with urination?: No Get up at night to urinate?: No Leakage of urine?: No Urine stream starts  and stops?: No Trouble starting stream?: No Do you have to strain to urinate?: No Blood in urine?: No Urinary tract infection?: No Sexually transmitted disease?: No Injury to kidneys or bladder?: No Painful intercourse?: No Weak stream?: No Currently pregnant?: No Vaginal bleeding?: No Last menstrual period?: Hysterectomy  Gastrointestinal Nausea?: No Vomiting?: No Indigestion/heartburn?: No Diarrhea?: No Constipation?: No  Constitutional Fever: No Night sweats?: No Weight loss?: No Fatigue?: No  Skin Skin rash/lesions?: No Itching?: No  Eyes Blurred vision?: No Double vision?: No  Ears/Nose/Throat Sore throat?: No Sinus problems?: No  Hematologic/Lymphatic Swollen glands?: No Easy bruising?: No  Cardiovascular Leg swelling?: No Chest pain?: No  Respiratory Cough?: No Shortness of breath?: No  Endocrine Excessive thirst?: No  Musculoskeletal Back pain?: No Joint pain?: No  Neurological Headaches?: No Dizziness?: No  Psychologic Depression?: No Anxiety?: No  Physical Exam: BP (!) 152/80 (BP Location: Left Arm, Patient Position: Sitting, Cuff Size: Normal)   Pulse (!) 101   Ht 4\' 8"  (1.422 m)   Wt 164 lb 3.2 oz (74.5 kg)   BMI 36.81 kg/m   Constitutional:  Alert, No acute distress.   Assessment & Plan:   72 year old female with chronic right UPJ obstruction and chronic stent drainage.  Her stent was not encrusted and was exchanged at 4 months dwell time.  We will plan on exchanging Q 5-6 months.   Abbie Sons, [place] [address] [place],  [address] [phone]

## 2018-06-19 ENCOUNTER — Telehealth: Payer: Self-pay | Admitting: Radiology

## 2018-06-19 DIAGNOSIS — N135 Crossing vessel and stricture of ureter without hydronephrosis: Secondary | ICD-10-CM

## 2018-06-19 NOTE — Telephone Encounter (Signed)
Dr Bernardo Heater recommends renal ultrasound to confirm status of hydronephrosis. Patient notified that radiology scheduling will call her to schedule renal ultrasound.

## 2018-06-19 NOTE — Telephone Encounter (Signed)
Spoke with patient to schedule cystoscopy with right ureteral stent exchange due December 2019. Patient reports right flank pain making it difficult to lie on her right side.  Dr Bernardo Heater - does stent exchange need to be moved up or any other recommendations?

## 2018-06-23 ENCOUNTER — Other Ambulatory Visit: Payer: Self-pay | Admitting: Radiology

## 2018-06-23 DIAGNOSIS — N135 Crossing vessel and stricture of ureter without hydronephrosis: Secondary | ICD-10-CM

## 2018-06-23 NOTE — Telephone Encounter (Signed)
Discussed the [facility] Information form below with patient over the phone.  [facility], [place], [place] [address] Telephone: [phone] Fax: [phone]   Thank you for choosing [facility] for your upcoming surgery!  We are always here to assist in your urological needs.  Please read the following information with specific details for your upcoming appointments related to your surgery. Please contact Talibah Colasurdo at [phone] Option 3 with any questions.  The Name of Your Surgery: Cystoscopy, possible right retrograde pyelogram, right ureteral stent exchange Your Surgery Date: 07/22/2018 Your Surgeon: [name]  Please call Same Day Surgery at [phone] between the hours of 1pm-3pm one day prior to your surgery. They will inform you of the time to arrive at Same Day Surgery which is located on the second floor of [facility].   Please refer to the attached letter regarding instructions for Pre-Admission Testing. You will receive a call from the [place] office regarding your appointment with them.  The Pre-Admission Testing office is located at [place], on the first floor of the [facility] in [place] (office is to the right as you enter through the [employer] of the [facility]). Please have all medications you are currently taking and your insurance card available.   Patient was advised to have nothing to eat or drink after midnight the night prior to surgery except that she may have only water until 2 hours before surgery with nothing to drink within 2 hours of surgery.  The patient states she currently takes aspirin 81mg  daily & was informed to continue mediation per Dr Bernardo Heater. Patient's questions were answered and she expressed understanding of these instructions.

## 2018-06-24 ENCOUNTER — Other Ambulatory Visit: Payer: Self-pay | Admitting: Radiology

## 2018-06-24 DIAGNOSIS — N135 Crossing vessel and stricture of ureter without hydronephrosis: Secondary | ICD-10-CM

## 2018-06-25 ENCOUNTER — Ambulatory Visit
Admission: RE | Admit: 2018-06-25 | Discharge: 2018-06-25 | Disposition: A | Discharge status: Home / Self Care | Payer: Medicare Other | Source: Ambulatory Visit | Attending: Urology | Admitting: Urology

## 2018-06-25 DIAGNOSIS — N1330 Unspecified hydronephrosis: Secondary | ICD-10-CM | POA: Diagnosis present

## 2018-06-25 DIAGNOSIS — N3289 Other specified disorders of bladder: Secondary | ICD-10-CM | POA: Insufficient documentation

## 2018-06-25 DIAGNOSIS — N135 Crossing vessel and stricture of ureter without hydronephrosis: Secondary | ICD-10-CM

## 2018-06-30 ENCOUNTER — Other Ambulatory Visit: Payer: Self-pay | Admitting: Internal Medicine

## 2018-06-30 DIAGNOSIS — Z1231 Encounter for screening mammogram for malignant neoplasm of breast: Secondary | ICD-10-CM

## 2018-07-02 ENCOUNTER — Encounter: Payer: Self-pay | Admitting: Neurology

## 2018-07-02 NOTE — Telephone Encounter (Signed)
Notes recorded by Abbie Sons, MD on 06/29/2018 at 12:26 PM EST Renal ultrasound showed no hydronephrosis. Unlikely her stent is obstructed.   [facility] informing patient of Dr Dene Gentry message above.

## 2018-07-11 ENCOUNTER — Telehealth: Payer: Self-pay | Admitting: Family Medicine

## 2018-07-11 ENCOUNTER — Other Ambulatory Visit: Payer: Self-pay

## 2018-07-11 ENCOUNTER — Encounter
Admission: RE | Admit: 2018-07-11 | Discharge: 2018-07-11 | Disposition: A | Discharge status: Home / Self Care | Payer: Medicare Other | Source: Ambulatory Visit | Attending: Urology | Admitting: Urology

## 2018-07-11 ENCOUNTER — Ambulatory Visit (INDEPENDENT_AMBULATORY_CARE_PROVIDER_SITE_OTHER): Payer: Medicare Other | Admitting: Family Medicine

## 2018-07-11 DIAGNOSIS — Z01812 Encounter for preprocedural laboratory examination: Secondary | ICD-10-CM | POA: Insufficient documentation

## 2018-07-11 DIAGNOSIS — N135 Crossing vessel and stricture of ureter without hydronephrosis: Secondary | ICD-10-CM | POA: Diagnosis not present

## 2018-07-11 LAB — URINALYSIS, COMPLETE
BILIRUBIN UA: NEGATIVE
GLUCOSE, UA: NEGATIVE
Ketones, UA: NEGATIVE
Nitrite, UA: NEGATIVE
PROTEIN UA: NEGATIVE
Specific Gravity, UA: 1.015 (ref 1.005–1.030)
Urobilinogen, Ur: 0.2 mg/dL (ref 0.2–1.0)
pH, UA: 7.5 (ref 5.0–7.5)

## 2018-07-11 LAB — POTASSIUM: Potassium: 4.1 mmol/L (ref 3.5–5.1)

## 2018-07-11 LAB — CBC
HEMATOCRIT: 36.6 % (ref 36.0–46.0)
Hemoglobin: 10.4 g/dL — ABNORMAL LOW (ref 12.0–15.0)
MCH: 22.2 pg — AB (ref 26.0–34.0)
MCHC: 28.4 g/dL — AB (ref 30.0–36.0)
MCV: 78 fL — ABNORMAL LOW (ref 80.0–100.0)
NRBC: 0 % (ref 0.0–0.2)
PLATELETS: 327 10*3/uL (ref 150–400)
RBC: 4.69 MIL/uL (ref 3.87–5.11)
RDW: 15.8 % — AB (ref 11.5–15.5)
WBC: 10.5 10*3/uL (ref 4.0–10.5)

## 2018-07-11 LAB — MICROSCOPIC EXAMINATION: Epithelial Cells (non renal): NONE SEEN /hpf (ref 0–10)

## 2018-07-11 MED ORDER — HYDROCODONE-ACETAMINOPHEN 5-325 MG PO TABS: 1.0000 | ORAL_TABLET | Freq: Four times a day (QID) | ORAL | 0 refills | Status: DC | PRN

## 2018-07-11 NOTE — Telephone Encounter (Signed)
Patient states she is having flank pain and would like to have some pain medication. She is scheduled for surgery on 12/17. Please advise.

## 2018-07-11 NOTE — Telephone Encounter (Signed)
rx sent

## 2018-07-11 NOTE — Patient Instructions (Addendum)
Your procedure is scheduled on: 07/22/18 Tues Report to Same Day Surgery 2nd floor medical mall [facility] Entrance-take elevator on left to 2nd floor.  Check in with surgery information desk.) To find out your arrival time please call [phone] between 1PM - 3PM on 07/21/18 Mon  Remember: Instructions that are not followed completely may result in serious medical risk, up to and including death, or upon the discretion of your surgeon and anesthesiologist your surgery may need to be rescheduled.    _x___ 1. Do not eat food after midnight the night before your procedure. You may drink clear liquids up to 2 hours before you are scheduled to arrive at the hospital for your procedure.  Do not drink clear liquids within 2 hours of your scheduled arrival to the hospital.  Clear liquids include  --Water or Apple juice without pulp  --Clear carbohydrate beverage such as ClearFast or Gatorade  --Black Coffee or Clear Tea (No milk, no creamers, do not add anything to                  the coffee or Tea Type 1 and type 2 diabetics should only drink water.   ____Ensure clear carbohydrate drink on the way to the hospital for bariatric patients  ____Ensure clear carbohydrate drink 3 hours before surgery for Dr Dwyane Luo patients if physician instructed.   No gum chewing or hard candies.     __x__ 2. No Alcohol for 24 hours before or after surgery.   __x__3. No Smoking or e-cigarettes for 24 prior to surgery.  Do not use any chewable tobacco products for at least 6 hour prior to surgery   ____  4. Bring all medications with you on the day of surgery if instructed.    __x__ 5. Notify your doctor if there is any change in your medical condition     (cold, fever, infections).    x___6. On the morning of surgery brush your teeth with toothpaste and water.  You may rinse your mouth with mouth wash if you wish.  Do not swallow any toothpaste or mouthwash.   Do not wear jewelry, make-up, hairpins,  clips or nail polish.  Do not wear lotions, powders, or perfumes. You may wear deodorant.  Do not shave 48 hours prior to surgery. Men may shave face and neck.  Do not bring valuables to the hospital.    [facility] is not responsible for any belongings or valuables.               Contacts, dentures or bridgework may not be worn into surgery.  Leave your suitcase in the car. After surgery it may be brought to your room.  For patients admitted to the hospital, discharge time is determined by your                       treatment team.  _  Patients discharged the day of surgery will not be allowed to drive home.  You will need someone to drive you home and stay with you the night of your procedure.    Please read over the following fact sheets that you were given:   [facility] Preparing for Surgery and or MRSA Information   _x___ Take anti-hypertensive listed below, cardiac, seizure, asthma,     anti-reflux and psychiatric medicines. These include:  1. albuterol (PROVENTIL HFA;VENTOLIN   2.atorvastatin (LIPITOR  3.buPROPion (WELLBUTRIN XL)   4.gabapentin (NEURONTIN) 100 MG capsule  5.levothyroxine (SYNTHROID, LEVOTHROID) 75 MCG tablet  6.pantoprazole (PROTONIX) 40 MG tablet  7telmisartan (MICARDIS) 40 MG tablet   ____Fleets enema or Magnesium Citrate as directed.   ____ Use CHG Soap or sage wipes as directed on instruction sheet   __x__ Use inhalers on the day of surgery and bring to hospital day of surgery  _x___ Stop Metformin and Janumet 2 days prior to surgery.    __x__ Take 1/2 of usual insulin dose the night before surgery and none on the morning     surgery.   _x___ Follow recommendations from Cardiologist, Pulmonologist or PCP regarding          stopping Aspirin, Coumadin, Plavix ,Eliquis, Effient, or Pradaxa, and Pletal.  X____Stop Anti-inflammatories such as Advil, Aleve, Ibuprofen, Motrin, Naproxen, Naprosyn, Goodies powders or aspirin products. OK to take Tylenol and                           Celebrex.   _x___ Stop supplements until after surgery.  But may continue Vitamin D, Vitamin B,       and multivitamin.   _x___ Bring C-Pap to the hospital.

## 2018-07-11 NOTE — Progress Notes (Signed)
In and Out Catheterization  Patient is present today for a I & O catheterization due to Pre op. Patient was cleaned and prepped in a sterile fashion with betadine and Lidocaine 2% jelly was instilled into the urethra.  A 14FR cath was inserted no complications were noted , 472ml of urine return was noted, urine was Light yellow in color. A clean urine sample was collected for UA, UCX. Bladder was drained  And catheter was removed with out difficulty.    Preformed by: Elberta Leatherwood, CMA

## 2018-07-17 ENCOUNTER — Other Ambulatory Visit: Payer: Self-pay | Admitting: Urology

## 2018-07-17 LAB — CULTURE, URINE COMPREHENSIVE

## 2018-07-17 MED ORDER — AMOXICILLIN 875 MG PO TABS: 875.0000 mg | ORAL_TABLET | Freq: Two times a day (BID) | ORAL | 0 refills | Status: DC

## 2018-07-18 ENCOUNTER — Telehealth: Payer: Self-pay | Admitting: Radiology

## 2018-07-18 NOTE — Telephone Encounter (Signed)
-----   Message from Abbie Sons, MD sent at 07/17/2018  2:23 PM EST ----- Preop urine culture was positive.  Rx amoxicillin was sent to pharmacy.

## 2018-07-18 NOTE — Telephone Encounter (Signed)
LMOM to notify pt. Of RX sent to pharmacy and follow up appt witth Dr. Bernardo Heater.

## 2018-07-21 MED ORDER — CEFAZOLIN SODIUM-DEXTROSE 2-4 GM/100ML-% IV SOLN
2.0000 g | INTRAVENOUS | Status: AC
  Administered 2018-07-22: 2 g via INTRAVENOUS

## 2018-07-21 NOTE — Telephone Encounter (Signed)
Confirmed with patient that script for amoxicillin was started on 07/18/2018.

## 2018-07-22 ENCOUNTER — Ambulatory Visit
Admission: RE | Admit: 2018-07-22 | Discharge: 2018-07-22 | Disposition: A | Discharge status: Home / Self Care | Payer: Medicare Other | Attending: Urology | Admitting: Urology

## 2018-07-22 ENCOUNTER — Other Ambulatory Visit: Payer: Self-pay

## 2018-07-22 ENCOUNTER — Encounter: Payer: Self-pay | Admitting: *Deleted

## 2018-07-22 ENCOUNTER — Encounter
Admission: RE | Disposition: A | Discharge status: Home / Self Care | Payer: Self-pay | Source: Home / Self Care | Attending: Urology

## 2018-07-22 ENCOUNTER — Ambulatory Visit: Payer: Medicare Other | Admitting: Anesthesiology

## 2018-07-22 DIAGNOSIS — N183 Chronic kidney disease, stage 3 (moderate): Secondary | ICD-10-CM | POA: Diagnosis not present

## 2018-07-22 DIAGNOSIS — I129 Hypertensive chronic kidney disease with stage 1 through stage 4 chronic kidney disease, or unspecified chronic kidney disease: Secondary | ICD-10-CM | POA: Insufficient documentation

## 2018-07-22 DIAGNOSIS — E1122 Type 2 diabetes mellitus with diabetic chronic kidney disease: Secondary | ICD-10-CM | POA: Diagnosis not present

## 2018-07-22 DIAGNOSIS — Z85828 Personal history of other malignant neoplasm of skin: Secondary | ICD-10-CM | POA: Diagnosis not present

## 2018-07-22 DIAGNOSIS — F0280 Dementia in other diseases classified elsewhere without behavioral disturbance: Secondary | ICD-10-CM | POA: Diagnosis not present

## 2018-07-22 DIAGNOSIS — Z9981 Dependence on supplemental oxygen: Secondary | ICD-10-CM | POA: Insufficient documentation

## 2018-07-22 DIAGNOSIS — J449 Chronic obstructive pulmonary disease, unspecified: Secondary | ICD-10-CM | POA: Insufficient documentation

## 2018-07-22 DIAGNOSIS — N1330 Unspecified hydronephrosis: Secondary | ICD-10-CM | POA: Diagnosis not present

## 2018-07-22 DIAGNOSIS — Z96653 Presence of artificial knee joint, bilateral: Secondary | ICD-10-CM | POA: Diagnosis not present

## 2018-07-22 DIAGNOSIS — G4730 Sleep apnea, unspecified: Secondary | ICD-10-CM | POA: Diagnosis not present

## 2018-07-22 DIAGNOSIS — G309 Alzheimer's disease, unspecified: Secondary | ICD-10-CM | POA: Insufficient documentation

## 2018-07-22 DIAGNOSIS — N131 Hydronephrosis with ureteral stricture, not elsewhere classified: Secondary | ICD-10-CM | POA: Insufficient documentation

## 2018-07-22 DIAGNOSIS — N135 Crossing vessel and stricture of ureter without hydronephrosis: Secondary | ICD-10-CM

## 2018-07-22 HISTORY — PX: CYSTOSCOPY W/ RETROGRADES: SHX1426

## 2018-07-22 HISTORY — PX: CYSTOSCOPY WITH STENT PLACEMENT: SHX5790

## 2018-07-22 LAB — GLUCOSE, CAPILLARY
Glucose-Capillary: 160 mg/dL — ABNORMAL HIGH (ref 70–99)
Glucose-Capillary: 177 mg/dL — ABNORMAL HIGH (ref 70–99)

## 2018-07-22 SURGERY — CYSTOSCOPY, WITH STENT INSERTION
Anesthesia: General | Site: Ureter | Laterality: Right

## 2018-07-22 MED ORDER — DEXAMETHASONE SODIUM PHOSPHATE 10 MG/ML IJ SOLN
INTRAMUSCULAR | Status: DC | PRN
  Administered 2018-07-22: 4 mg via INTRAVENOUS

## 2018-07-22 MED ORDER — PROPOFOL 10 MG/ML IV BOLUS
INTRAVENOUS | Status: AC
  Filled 2018-07-22: qty 20

## 2018-07-22 MED ORDER — PHENYLEPHRINE HCL 10 MG/ML IJ SOLN
INTRAMUSCULAR | Status: AC
  Filled 2018-07-22: qty 1

## 2018-07-22 MED ORDER — IPRATROPIUM-ALBUTEROL 0.5-2.5 (3) MG/3ML IN SOLN
RESPIRATORY_TRACT | Status: AC
  Filled 2018-07-22: qty 3

## 2018-07-22 MED ORDER — ONDANSETRON HCL 4 MG/2ML IJ SOLN
INTRAMUSCULAR | Status: DC | PRN
  Administered 2018-07-22: 4 mg via INTRAVENOUS

## 2018-07-22 MED ORDER — IPRATROPIUM-ALBUTEROL 0.5-2.5 (3) MG/3ML IN SOLN
3.0000 mL | Freq: Once | RESPIRATORY_TRACT | Status: AC
  Administered 2018-07-22: 3 mL via RESPIRATORY_TRACT

## 2018-07-22 MED ORDER — PROPOFOL 10 MG/ML IV BOLUS
INTRAVENOUS | Status: DC | PRN
  Administered 2018-07-22 (×2): 30 mg via INTRAVENOUS
  Administered 2018-07-22: 100 mg via INTRAVENOUS
  Administered 2018-07-22 (×2): 20 mg via INTRAVENOUS

## 2018-07-22 MED ORDER — IOPAMIDOL (ISOVUE-M 200) INJECTION 41%
INTRAMUSCULAR | Status: AC
  Filled 2018-07-22: qty 10

## 2018-07-22 MED ORDER — LIDOCAINE HCL (PF) 2 % IJ SOLN
INTRAMUSCULAR | Status: AC
  Filled 2018-07-22: qty 10

## 2018-07-22 MED ORDER — DEXAMETHASONE SODIUM PHOSPHATE 10 MG/ML IJ SOLN
INTRAMUSCULAR | Status: AC
  Filled 2018-07-22: qty 1

## 2018-07-22 MED ORDER — LIDOCAINE HCL (CARDIAC) PF 100 MG/5ML IV SOSY
PREFILLED_SYRINGE | INTRAVENOUS | Status: DC | PRN
  Administered 2018-07-22: 60 mg via INTRAVENOUS

## 2018-07-22 MED ORDER — SODIUM CHLORIDE 0.9 % IV SOLN
INTRAVENOUS | Status: DC
  Administered 2018-07-22: 07:00:00 via INTRAVENOUS

## 2018-07-22 MED ORDER — IOPAMIDOL (ISOVUE-200) INJECTION 41%
INTRAVENOUS | Status: DC | PRN
  Administered 2018-07-22: 15 mL

## 2018-07-22 MED ORDER — FENTANYL CITRATE (PF) 100 MCG/2ML IJ SOLN
INTRAMUSCULAR | Status: AC
  Filled 2018-07-22: qty 2

## 2018-07-22 MED ORDER — HYDROCODONE-ACETAMINOPHEN 5-325 MG PO TABS: 1.0000 | ORAL_TABLET | Freq: Four times a day (QID) | ORAL | 0 refills | Status: DC | PRN

## 2018-07-22 MED ORDER — PHENYLEPHRINE HCL 10 MG/ML IJ SOLN
INTRAMUSCULAR | Status: DC | PRN
  Administered 2018-07-22 (×2): 100 ug via INTRAVENOUS

## 2018-07-22 MED ORDER — FENTANYL CITRATE (PF) 100 MCG/2ML IJ SOLN: 25.0000 ug | INTRAMUSCULAR | Status: DC | PRN

## 2018-07-22 MED ORDER — ONDANSETRON HCL 4 MG/2ML IJ SOLN
INTRAMUSCULAR | Status: AC
  Filled 2018-07-22: qty 2

## 2018-07-22 MED ORDER — FENTANYL CITRATE (PF) 100 MCG/2ML IJ SOLN
INTRAMUSCULAR | Status: DC | PRN
  Administered 2018-07-22 (×2): 25 ug via INTRAVENOUS

## 2018-07-22 MED ORDER — CEFAZOLIN SODIUM-DEXTROSE 2-4 GM/100ML-% IV SOLN
INTRAVENOUS | Status: AC
  Filled 2018-07-22: qty 100

## 2018-07-22 SURGICAL SUPPLY — 23 items
BAG DRAIN CYSTO-URO LG1000N (MISCELLANEOUS) ×2 IMPLANT
BRUSH SCRUB EZ  4% CHG (MISCELLANEOUS) ×1
BRUSH SCRUB EZ 4% CHG (MISCELLANEOUS) ×1 IMPLANT
CATH URETL 5X70 OPEN END (CATHETERS) ×2 IMPLANT
DRAPE UTILITY 15X26 TOWEL STRL (DRAPES) ×2 IMPLANT
GLOVE BIO SURGEON STRL SZ8 (GLOVE) ×2 IMPLANT
GOWN STANDARD XL  REUSABL (MISCELLANEOUS) ×2 IMPLANT
GOWN STRL REUS W/ TWL LRG LVL3 (GOWN DISPOSABLE) ×1 IMPLANT
GOWN STRL REUS W/TWL LRG LVL3 (GOWN DISPOSABLE) ×2
GOWN STRL REUS W/TWL XL LVL3 (GOWN DISPOSABLE) ×2 IMPLANT
KIT TURNOVER CYSTO (KITS) ×2 IMPLANT
PACK CYSTO AR (MISCELLANEOUS) ×2 IMPLANT
SENSORWIRE 0.038 NOT ANGLED (WIRE) ×2
SET CYSTO W/LG BORE CLAMP LF (SET/KITS/TRAYS/PACK) ×2 IMPLANT
SOL .9 NS 3000ML IRR  AL (IV SOLUTION) ×1
SOL .9 NS 3000ML IRR AL (IV SOLUTION) ×1
SOL .9 NS 3000ML IRR UROMATIC (IV SOLUTION) ×1 IMPLANT
STENT URET 6FRX24 CONTOUR (STENTS) IMPLANT
STENT URET 6FRX26 CONTOUR (STENTS) IMPLANT
STENT URO INLAY 6FRX22CM (STENTS) ×2 IMPLANT
SURGILUBE 2OZ TUBE FLIPTOP (MISCELLANEOUS) ×2 IMPLANT
WATER STERILE IRR 1000ML POUR (IV SOLUTION) ×2 IMPLANT
WIRE SENSOR 0.038 NOT ANGLED (WIRE) ×1 IMPLANT

## 2018-07-22 NOTE — Anesthesia Postprocedure Evaluation (Signed)
Anesthesia Post Note  Patient: Lori Blanchard  Procedure(s) Performed: CYSTOSCOPY WITH STENT Exchange (Right Ureter) CYSTOSCOPY WITH RETROGRADE PYELOGRAM (Right Ureter)  Patient location during evaluation: PACU Anesthesia Type: General Level of consciousness: awake and alert Pain management: pain level controlled Vital Signs Assessment: post-procedure vital signs reviewed and stable Respiratory status: spontaneous breathing, nonlabored ventilation, respiratory function stable and patient connected to nasal cannula oxygen Cardiovascular status: blood pressure returned to baseline and stable Postop Assessment: no apparent nausea or vomiting Anesthetic complications: no     Last Vitals:  Vitals:   07/22/18 1009 07/22/18 1037  BP: (!) 160/78 (!) 149/65  Pulse: 94 96  Resp: 20 20  Temp: 36.4 C   SpO2: 95% 93%    Last Pain:  Vitals:   07/22/18 1037  TempSrc:   PainSc: 0-No pain                 Durenda Hurt

## 2018-07-22 NOTE — OR Nursing (Signed)
To postop with incentive spirometry demonstrates proper use with same.

## 2018-07-22 NOTE — Anesthesia Post-op Follow-up Note (Signed)
Anesthesia QCDR form completed.        

## 2018-07-22 NOTE — Op Note (Signed)
Preoperative diagnosis: 1. Right UPJ obstruction  Postoperative diagnosis: 1. Right UPJ obstruction  Procedure: 1. Cystoscopy with removal of right ureteral stent 2. Right retrograde pyelogram with interpretation 3. Exchange of right ureteral stent  Surgeon:Scott C Stoioff, MD  Anesthesia:General  Complications:None  Intraoperative findings: Right retrograde pyelogram: Moderate right hydronephrosis with narrowing at the UPJ.  The ureter was normal in appearance  TWS:[id number]  Specimens:None  Indication:Lori H Smitheyis a73 y.o. female witha chronic right UPJ obstruction which has been symptomatic.She is felt to be a poor surgical candidate and has managed with ureteral stent drainage. She presents today for stent exchange. After reviewing the management options for treatment, he elected to proceed with the above surgical procedure(s). We have discussed the potential benefits and risks of the procedure, side effects of the proposed treatment, the likelihood of the patient achieving the goals of the procedure, and any potential problems that might occur during the procedure or recuperation. Informed consent has been obtained.  Description of procedure:  The patient was taken to the operating room and general anesthesia was induced. The patient was placed in the dorsal lithotomy position, prepped and draped in the usual sterile fashion, and preoperative antibiotics were administered. A preoperative time-out was performed.   A 21 French cystoscope was lubricated and passed per urethra. Panendoscopy was performed and the bladder mucosa was normal in appearance without erythema, solid or papillary lesions. The stent was grasped with endoscopic forceps and brought out to the urethral meatus. No encrustation was noted. A 0.038 Sensor wire was easily placed through the stent up to the renal pelvis. The ureteral stent was removed. A 5 French open-ended catheter  was placed over the wire and after removing the guidewire right retrograde pyelogram was performed with findings as described above. The guidewire was replaced and the ureteral catheter was removed.    A 6 French/22 cm Bard Optima ureteral stent was placed under fluoroscopic guidance without difficulty. There was good curl seen in the renal pelvis and bladder under fluoroscopy.  After anesthetic reversal the patient was transported to the PACU in stable condition.  Plan: Will tentatively plan on stent exchange in 6 months since no significant encrustation was noted after 4 months.   Abbie Sons, M.D.

## 2018-07-22 NOTE — Transfer of Care (Signed)
Immediate Anesthesia Transfer of Care Note  Patient: Lori Blanchard  Procedure(s) Performed: CYSTOSCOPY WITH STENT Exchange (Right Ureter) CYSTOSCOPY WITH RETROGRADE PYELOGRAM (Right Ureter)  Patient Location: PACU  Anesthesia Type:General  Level of Consciousness: drowsy  Airway & Oxygen Therapy: Patient Spontanous Breathing and Patient connected to face mask oxygen  Post-op Assessment: Report given to RN and Post -op Vital signs reviewed and stable  Post vital signs: Reviewed and stable  Last Vitals:  Vitals Value Taken Time  BP    Temp    Pulse    Resp    SpO2      Last Pain:  Vitals:   07/22/18 0618  TempSrc: Tympanic  PainSc: 6          Complications: No apparent anesthesia complications

## 2018-07-22 NOTE — Anesthesia Preprocedure Evaluation (Addendum)
Anesthesia Evaluation  Patient identified by MRN, date of birth, ID band Patient awake    Reviewed: Allergy & Precautions, H&P , NPO status , Patient's Chart, lab work & pertinent test results  Airway Mallampati: III       Dental  (+) Chipped, Missing, Poor Dentition   Pulmonary shortness of breath, with exertion and Long-Term Oxygen Therapy, asthma , sleep apnea , COPD,  COPD inhaler and oxygen dependent, neg recent URI,  Pt has chronic cough and wears 2L [place] at night.  Denies recent illnesses or exacerbations.          Cardiovascular hypertension, (-) angina+CHF  (-) Past MI, (-) Cardiac Stents and (-) CABG (-) dysrhythmias   CHF listed in chart but no echo available for review.  Pt has tolerated multiple prior anesthetics without issue.  Symptoms (DOE) have been stable.   Neuro/Psych PSYCHIATRIC DISORDERS Anxiety Depression Dementia negative neurological ROS     GI/Hepatic Neg liver ROS, GERD  ,  Endo/Other  diabetesHypothyroidism   Renal/GU CRFRenal disease     Musculoskeletal  (+) Arthritis ,   Abdominal   Peds  Hematology  (+) Blood dyscrasia, anemia ,   Anesthesia Other Findings Past Medical History: No date: Alzheimer disease ([place]) No date: Anxiety No date: Anxiety and depression No date: Asthma No date: Cancer [facility])     Comment:  Basal Cell Skin Cancer No date: CHF (congestive heart failure) (HCC)     Comment:  patient denies this diagnosis No date: COPD (chronic obstructive pulmonary disease) (HCC) No date: Depression No date: Diabetes ([place]) No date: Diverticulosis     Comment:  patient denies problems for this No date: Dyspnea No date: Elevated lipids No date: Fatty liver No date: GERD (gastroesophageal reflux disease) No date: Gout     Comment:  no medication at this time No date: History of kidney stones No date: Hydronephrosis     Comment:  stage III ckd No date: Hyperlipemia No date:  Hypertension No date: Hypothyroidism No date: Lower extremity edema     Comment:  comes and goes No date: Osteoarthritis No date: Osteoarthritis No date: Osteoporosis No date: Pulmonary nodule     Comment:  has been followed for years, not cancerous. 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 No date: Sleep apnea     Comment:  CPAP No date: Ureter injury No date: Ureteropelvic junction (UPJ) obstruction     Comment:  requires change of ureteral stent approximately every 2               to 3 months  Past Surgical History: No date: ABDOMINAL HYSTERECTOMY     Comment:  partial No date: ANKLE FRACTURE SURGERY; Left     Comment:  pins in ankle but not in correct location 1965: APPENDECTOMY 1990: BREAST SURGERY; Bilateral     Comment:  Reduction No date: c setion 2000: CARPAL TUNNEL RELEASE; Bilateral [id number], 1969: CESAREAN SECTION     Comment:  x 3 11/02/2015: CYSTOSCOPY W/ RETROGRADES; Right     Comment:  Procedure: [place],               URETERAL STENT EXCHANGE;  Surgeon: [name],               MD;  Location: [facility];  Service: Urology;  Laterality:  Right; 01/31/2016: CYSTOSCOPY W/ RETROGRADES; Right     Comment:  Procedure: CYSTOSCOPY WITH RETROGRADE PYELOGRAM;                Surgeon: [name], MD;  Location: [facility];                Service: Urology;  Laterality: Right; 05/09/2016: CYSTOSCOPY W/ RETROGRADES; Bilateral     Comment:  Procedure: CYSTOSCOPY WITH RETROGRADE PYELOGRAM;                Surgeon: [name], MD;  Location: [facility];                Service: Urology;  Laterality: Bilateral; 11/21/2016: CYSTOSCOPY W/ RETROGRADES; Left     Comment:  Procedure: CYSTOSCOPY WITH RETROGRADE PYELOGRAM;                Surgeon: [name], MD;  Location: [facility];                Service: Urology;  Laterality: Left; 03/27/2017: CYSTOSCOPY W/ RETROGRADES; Right      Comment:  Procedure: CYSTOSCOPY WITH RETROGRADE PYELOGRAM;                Surgeon: [name], MD;  Location: [facility];                Service: Urology;  Laterality: Right; 05/31/2017: CYSTOSCOPY W/ RETROGRADES; Bilateral     Comment:  Procedure: CYSTOSCOPY WITH RETROGRADE PYELOGRAM;                Surgeon: [name], MD;  Location: [facility];                Service: Urology;  Laterality: Bilateral; 05/11/2015: CYSTOSCOPY W/ URETERAL STENT PLACEMENT; Right     Comment:  Procedure: CYSTOSCOPY WITH STENT REPLACEMENT;  Surgeon:               [name], MD;  Location: [facility];  Service:               Urology;  Laterality: Right; 01/31/2016: CYSTOSCOPY W/ URETERAL STENT PLACEMENT; Right     Comment:  Procedure: CYSTOSCOPY, RETROGRADE PYELOGRAMS WITH STENT               REPLACEMENT;  Surgeon: [name], MD;  Location:              [facility];  Service: Urology;  Laterality: Right; 05/09/2016: CYSTOSCOPY W/ URETERAL STENT PLACEMENT; Right     Comment:  Procedure: CYSTOSCOPY WITH STENT REPLACEMENT;  Surgeon:               [name], MD;  Location: [facility];  Service:               Urology;  Laterality: Right; 07/04/2016: CYSTOSCOPY W/ URETERAL STENT PLACEMENT; Bilateral     Comment:  Procedure: CYSTOSCOPY WITH STENT REPLACEMENT;  Surgeon:               [name], MD;  Location: [facility];  Service:               Urology;  Laterality: Bilateral; 09/14/2016: CYSTOSCOPY W/ URETERAL STENT PLACEMENT; Right     Comment:  Procedure: CYSTOSCOPY WITH STENT REPLACEMENT;  Surgeon:               [name], MD;  Location: [facility];  Service:  Urology;  Laterality: Right; 11/21/2016: CYSTOSCOPY W/ URETERAL STENT PLACEMENT; Right     Comment:  Procedure: CYSTOSCOPY WITH STENT REPLACEMENT;  Surgeon:               [name], MD;  Location: [facility];  Service:               Urology;  Laterality: Right; 01/25/2017: CYSTOSCOPY W/ URETERAL  STENT PLACEMENT; Right     Comment:  Procedure: CYSTOSCOPY WITH STENT REPLACEMENT;  Surgeon:               [name], MD;  Location: [facility];  Service:               Urology;  Laterality: Right; 03/27/2017: CYSTOSCOPY W/ URETERAL STENT PLACEMENT; Right     Comment:  Procedure: CYSTOSCOPY WITH STENT REPLACEMENT;  Surgeon:               [name], MD;  Location: [facility];  Service:               Urology;  Laterality: Right; 05/31/2017: CYSTOSCOPY W/ URETERAL STENT PLACEMENT; Right     Comment:  Procedure: CYSTOSCOPY WITH STENT REPLACEMENT;  Surgeon:               [name], MD;  Location: [facility];  Service:               Urology;  Laterality: Right; 08/02/2017: CYSTOSCOPY W/ URETERAL STENT PLACEMENT; Right     Comment:  Procedure: CYSTOSCOPY WITH STENT REPLACEMENT;  Surgeon:               [name], MD;  Location: [facility];  Service:               Urology;  Laterality: Right; 10/29/2017: CYSTOSCOPY W/ URETERAL STENT PLACEMENT; Right     Comment:  Procedure: CYSTOSCOPY WITH STENT REPLACEMENT;  Surgeon:               Abbie Sons, MD;  Location: [facility];  Service:               Urology;  Laterality: Right; 02/14/2018: CYSTOSCOPY W/ URETERAL STENT PLACEMENT; Right     Comment:  Procedure: [place] Exchange;  Surgeon: Abbie Sons, MD;                Location: [facility];  Service: Urology;  Laterality:               Right; 09/14/2016: CYSTOSCOPY W/ URETERAL STENT REMOVAL; Left     Comment:  Procedure: CYSTOSCOPY WITH STENT REMOVAL;  Surgeon:               [name], MD;  Location: [facility];  Service:               Urology;  Laterality: Left; 05/09/2016: [place]; Left     Comment:  Procedure: CYSTOSCOPY WITH STENT PLACEMENT;  Surgeon:               [name], MD;  Location: [facility];  Service:               Urology;  Laterality: Left; 2018: EYE  SURGERY; Left     Comment:  tear duct reconstruction 2005: HERNIA REPAIR     Comment:  umbilical 3748: JOINT REPLACEMENT; Left     Comment:  TKR 2011: JOINT REPLACEMENT; Right     Comment:  TKR No date: LAPAROSCOPIC HYSTERECTOMY No date: REDUCTION MAMMAPLASTY No date: REPLACEMENT TOTAL KNEE; Bilateral 2018: SKIN CANCER EXCISION; Left     Comment:  lower eye lid 05/09/2016: URETEROSCOPY; Bilateral     Comment:  Procedure: URETEROSCOPY;  Surgeon: [name],               MD;  Location: [facility];  Service: Urology;  Laterality:               Bilateral; 07/04/2016: URETEROSCOPY WITH HOLMIUM LASER LITHOTRIPSY; Left     Comment:  Procedure: URETEROSCOPY WITH HOLMIUM LASER LITHOTRIPSY;               Surgeon: [name], MD;  Location: [facility];                Service: Urology;  Laterality: Left;     Reproductive/Obstetrics negative OB ROS                            Anesthesia Physical Anesthesia Plan  ASA: III  Anesthesia Plan: General LMA   Post-op Pain Management:    Induction: Intravenous  PONV Risk Score and Plan: Dexamethasone, Ondansetron, Midazolam and Treatment may vary due to age or medical condition  Airway Management Planned: LMA  Additional Equipment:   Intra-op Plan:   Post-operative Plan:   Informed Consent: I have reviewed the patients History and Physical, chart, labs and discussed the procedure including the risks, benefits and alternatives for the proposed anesthesia with the patient or authorized representative who has indicated his/her understanding and acceptance.   Dental Advisory Given  Plan Discussed with: Anesthesiologist, CRNA and Surgeon  Anesthesia Plan Comments:         Anesthesia Quick Evaluation

## 2018-07-22 NOTE — Discharge Instructions (Signed)

## 2018-07-22 NOTE — H&P (Signed)
07/22/2018 7:00 AM   [place] 12-May-1946 [id number]  Referring provider: No referring provider defined for this encounter.  No chief complaint on file.   HPI: Lori H Smitheyis a72 y.o.patient witha chronic right UPJ obstruction which has been symptomatic.She is felt to be a poor surgical candidate and has managed with ureteral stent drainage.  Her stent was last exchanged in July 2019 and she presents today for same.  She has no complaints.  She had been having increased right flank pain.  Urine culture was positive for 50,000 colonies of enterococcus which is currently being treated.  PMH:     Past Medical History:  Diagnosis Date  . Alzheimer disease   . Anxiety   . Anxiety and depression   . Asthma   . Cancer ([place])    Basal Cell Skin Cancer  . CHF (congestive heart failure) ([place])    patient denies this diagnosis  . COPD (chronic obstructive pulmonary disease) ([place])   . Depression   . Diabetes ([place])   . Diverticulosis    patient denies problems for this  . Dyspnea   . Elevated lipids   . Fatty liver   . GERD (gastroesophageal reflux disease)   . Gout    no medication at this time  . History of kidney stones   . Hydronephrosis    stage III ckd  . Hyperlipemia   . Hypertension   . Hypothyroidism   . Lower extremity edema    comes and goes  . Osteoarthritis   . Osteoarthritis   . Osteoporosis   . Pulmonary nodule    has been followed for years, not cancerous. causes breathing issues  . Retroperitoneal fibrosis   . Sleep apnea    CPAP  . Ureter injury   . Ureteropelvic junction (UPJ) obstruction    requires change of ureteral stent approximately every 2 to 3 months    Surgical History:      Past Surgical History:  Procedure Laterality Date  . ABDOMINAL HYSTERECTOMY     partial  . ANKLE FRACTURE SURGERY Left    pins in ankle but not in correct location  . APPENDECTOMY  1965  . BREAST SURGERY  Bilateral 1990   Reduction  . c setion    . CARPAL TUNNEL RELEASE Bilateral 2000  . CESAREAN SECTION  [id number],[id number], 1969   x 3  . CYSTOSCOPY W/ RETROGRADES Right 11/02/2015   Procedure: CYSTOSCOPY WITH RETROGRADE PYELOGRAM, URETERAL STENT EXCHANGE;  Surgeon: [name], MD;  Location: [facility];  Service: Urology;  Laterality: Right;  . CYSTOSCOPY W/ RETROGRADES Right 01/31/2016   Procedure: CYSTOSCOPY WITH RETROGRADE PYELOGRAM;  Surgeon: [name], MD;  Location: [facility];  Service: Urology;  Laterality: Right;  . CYSTOSCOPY W/ RETROGRADES Bilateral 05/09/2016   Procedure: CYSTOSCOPY WITH RETROGRADE PYELOGRAM;  Surgeon: [name], MD;  Location: [facility];  Service: Urology;  Laterality: Bilateral;  . CYSTOSCOPY W/ RETROGRADES Left 11/21/2016   Procedure: CYSTOSCOPY WITH RETROGRADE PYELOGRAM;  Surgeon: [name], MD;  Location: [facility];  Service: Urology;  Laterality: Left;  . CYSTOSCOPY W/ RETROGRADES Right 03/27/2017   Procedure: CYSTOSCOPY WITH RETROGRADE PYELOGRAM;  Surgeon: [name], MD;  Location: [facility];  Service: Urology;  Laterality: Right;  . CYSTOSCOPY W/ RETROGRADES Bilateral 05/31/2017   Procedure: CYSTOSCOPY WITH RETROGRADE PYELOGRAM;  Surgeon: [name], MD;  Location: [facility];  Service: Urology;  Laterality: Bilateral;  . CYSTOSCOPY W/ URETERAL STENT PLACEMENT Right 05/11/2015  Procedure: CYSTOSCOPY WITH STENT REPLACEMENT;  Surgeon: [name], MD;  Location: [facility];  Service: Urology;  Laterality: Right;  . CYSTOSCOPY W/ URETERAL STENT PLACEMENT Right 01/31/2016   Procedure: CYSTOSCOPY, RETROGRADE PYELOGRAMS WITH STENT REPLACEMENT;  Surgeon: [name], MD;  Location: [facility];  Service: Urology;  Laterality: Right;  . CYSTOSCOPY W/ URETERAL STENT PLACEMENT Right 05/09/2016   Procedure: CYSTOSCOPY WITH STENT REPLACEMENT;  Surgeon: [name], MD;  Location: [facility];  Service: Urology;   Laterality: Right;  . CYSTOSCOPY W/ URETERAL STENT PLACEMENT Bilateral 07/04/2016   Procedure: CYSTOSCOPY WITH STENT REPLACEMENT;  Surgeon: [name], MD;  Location: [facility];  Service: Urology;  Laterality: Bilateral;  . CYSTOSCOPY W/ URETERAL STENT PLACEMENT Right 09/14/2016   Procedure: CYSTOSCOPY WITH STENT REPLACEMENT;  Surgeon: [name], MD;  Location: [facility];  Service: Urology;  Laterality: Right;  . CYSTOSCOPY W/ URETERAL STENT PLACEMENT Right 11/21/2016   Procedure: CYSTOSCOPY WITH STENT REPLACEMENT;  Surgeon: [name], MD;  Location: [facility];  Service: Urology;  Laterality: Right;  . CYSTOSCOPY W/ URETERAL STENT PLACEMENT Right 01/25/2017   Procedure: CYSTOSCOPY WITH STENT REPLACEMENT;  Surgeon: [name], MD;  Location: [facility];  Service: Urology;  Laterality: Right;  . CYSTOSCOPY W/ URETERAL STENT PLACEMENT Right 03/27/2017   Procedure: CYSTOSCOPY WITH STENT REPLACEMENT;  Surgeon: [name], MD;  Location: [facility];  Service: Urology;  Laterality: Right;  . CYSTOSCOPY W/ URETERAL STENT PLACEMENT Right 05/31/2017   Procedure: CYSTOSCOPY WITH STENT REPLACEMENT;  Surgeon: [name], MD;  Location: [facility];  Service: Urology;  Laterality: Right;  . CYSTOSCOPY W/ URETERAL STENT PLACEMENT Right 08/02/2017   Procedure: CYSTOSCOPY WITH STENT REPLACEMENT;  Surgeon: [name], MD;  Location: [facility];  Service: Urology;  Laterality: Right;  . CYSTOSCOPY W/ URETERAL STENT PLACEMENT Right 10/29/2017   Procedure: CYSTOSCOPY WITH STENT REPLACEMENT;  Surgeon: Abbie Sons, MD;  Location: [facility];  Service: Urology;  Laterality: Right;  . CYSTOSCOPY W/ URETERAL STENT REMOVAL Left 09/14/2016   Procedure: CYSTOSCOPY WITH STENT REMOVAL;  Surgeon: [name], MD;  Location: [facility];  Service: Urology;  Laterality: Left;  . CYSTOSCOPY WITH STENT PLACEMENT Left 05/09/2016   Procedure: CYSTOSCOPY WITH STENT PLACEMENT;   Surgeon: [name], MD;  Location: [facility];  Service: Urology;  Laterality: Left;  . EYE SURGERY Left 2018   tear duct reconstruction  . HERNIA REPAIR  5638   umbilical  . JOINT REPLACEMENT Left 2013   TKR  . JOINT REPLACEMENT Right 2011   TKR  . LAPAROSCOPIC HYSTERECTOMY    . REDUCTION MAMMAPLASTY    . REPLACEMENT TOTAL KNEE Bilateral   . SKIN CANCER EXCISION Left 2018   lower eye lid  . URETEROSCOPY Bilateral 05/09/2016   Procedure: URETEROSCOPY;  Surgeon: [name], MD;  Location: [facility];  Service: Urology;  Laterality: Bilateral;  . URETEROSCOPY WITH HOLMIUM LASER LITHOTRIPSY Left 07/04/2016   Procedure: URETEROSCOPY WITH HOLMIUM LASER LITHOTRIPSY;  Surgeon: [name], MD;  Location: [facility];  Service: Urology;  Laterality: Left;    Home Medications:  Reviewed  Allergies:      Allergies  Allergen Reactions  . Ibuprofen Itching, Nausea Only and Rash    Family History:      Family History  Problem Relation Age of Onset  . Liver cancer Mother   . Colon cancer Mother   . Breast cancer Mother 76  . Diabetes  Daughter   . Kidney disease Daughter        adrenal tumors  . Kidney cancer Neg Hx   . Prostate cancer Neg Hx     Social History:  reports that she has never smoked. She has never used smokeless tobacco. She reports that she does not drink alcohol or use drugs.  ROS: Denies fever, chills, chest pain  Physical Exam: BP (!) 159/82   Pulse 97   Temp 98.2 F (36.8 C) (Tympanic)   Resp 14   Ht 4\' 8"  (1.422 m)   Wt 161 lb (73 kg)   SpO2 95%   BMI 36.10 kg/m   Constitutional:  Alert and oriented, No acute distress. HEENT: [place] AT, moist mucus membranes.  Trachea midline, no masses. Cardiovascular: No clubbing, cyanosis, or edema.  RRR Respiratory: Normal respiratory effort, no increased work of breathing.  Lungs clear GI: Abdomen is soft, nontender, nondistended, no abdominal masses GU: No CVA  tenderness Lymph: No cervical or inguinal lymphadenopathy. Skin: No rashes, bruises or suspicious lesions. Neurologic: Grossly intact, no focal deficits, moving all 4 extremities. Psychiatric: Normal mood and affect.   Assessment & Plan:   72 year old female with chronic right UPJ obstruction who presents for stent exchange.  The procedure has been discussed in detail including potential risks.  All questions were answered and she desires to proceed.   Abbie Sons, [place] [address] [place], [place] [address] [phone]

## 2018-07-22 NOTE — Anesthesia Procedure Notes (Signed)
Procedure Name: LMA Insertion Date/Time: 07/22/2018 7:54 AM Performed by: Eben Burow, CRNA Pre-anesthesia Checklist: Patient identified, Emergency Drugs available, Suction available and Patient being monitored Patient Re-evaluated:Patient Re-evaluated prior to induction Oxygen Delivery Method: Circle system utilized Preoxygenation: Pre-oxygenation with 100% oxygen Induction Type: IV induction LMA: LMA inserted LMA Size: 4.0 Number of attempts: 1 Placement Confirmation: positive ETCO2 and breath sounds checked- equal and bilateral Tube secured with: Tape Dental Injury: Teeth and Oropharynx as per pre-operative assessment

## 2018-07-22 NOTE — Interval H&P Note (Signed)
History and Physical Interval Note:  07/22/2018 7:33 AM  [place]  has presented today for surgery, with the diagnosis of right ureteropelvic junction obstruction  The various methods of treatment have been discussed with the patient and family. After consideration of risks, benefits and other options for treatment, the patient has consented to  Procedure(s): CYSTOSCOPY WITH STENT Exchange (Right) [place] (Right) as a surgical intervention .  The patient's history has been reviewed, patient examined, no change in status, stable for surgery.  I have reviewed the patient's chart and labs.  Questions were answered to the patient's satisfaction.     [place]

## 2018-07-23 ENCOUNTER — Ambulatory Visit
Admission: RE | Admit: 2018-07-23 | Discharge: 2018-07-23 | Disposition: A | Discharge status: Home / Self Care | Payer: Medicare Other | Source: Ambulatory Visit | Attending: Internal Medicine | Admitting: Internal Medicine

## 2018-07-23 DIAGNOSIS — Z1231 Encounter for screening mammogram for malignant neoplasm of breast: Secondary | ICD-10-CM | POA: Diagnosis not present

## 2018-08-22 ENCOUNTER — Other Ambulatory Visit (HOSPITAL_COMMUNITY): Payer: Self-pay | Admitting: Specialist

## 2018-08-22 ENCOUNTER — Other Ambulatory Visit: Payer: Self-pay | Admitting: Specialist

## 2018-08-22 DIAGNOSIS — J849 Interstitial pulmonary disease, unspecified: Secondary | ICD-10-CM

## 2018-09-19 ENCOUNTER — Ambulatory Visit
Admission: RE | Admit: 2018-09-19 | Discharge: 2018-09-19 | Disposition: A | Discharge status: Home / Self Care | Payer: Medicare Other | Source: Ambulatory Visit | Attending: Specialist | Admitting: Specialist

## 2018-09-19 DIAGNOSIS — J849 Interstitial pulmonary disease, unspecified: Secondary | ICD-10-CM | POA: Diagnosis not present

## 2018-09-22 ENCOUNTER — Other Ambulatory Visit: Payer: Self-pay

## 2018-09-22 ENCOUNTER — Encounter: Payer: Self-pay | Admitting: Neurology

## 2018-09-22 ENCOUNTER — Ambulatory Visit: Payer: Medicare Other | Admitting: Neurology

## 2018-09-22 VITALS — BP 144/62 | HR 100 | Ht <= 58 in | Wt 164.0 lb

## 2018-09-22 DIAGNOSIS — F0390 Unspecified dementia without behavioral disturbance: Secondary | ICD-10-CM | POA: Diagnosis not present

## 2018-09-22 DIAGNOSIS — F03A0 Unspecified dementia, mild, without behavioral disturbance, psychotic disturbance, mood disturbance, and anxiety: Secondary | ICD-10-CM

## 2018-09-22 MED ORDER — BUPROPION HCL ER (XL) 300 MG PO TB24: 300.0000 mg | ORAL_TABLET | Freq: Every day | ORAL | 3 refills | Status: DC

## 2018-09-22 MED ORDER — QUETIAPINE FUMARATE 25 MG PO TABS: 25.0000 mg | ORAL_TABLET | Freq: Every day | ORAL | 3 refills | Status: DC

## 2018-09-22 NOTE — Progress Notes (Signed)
NEUROLOGY CONSULTATION NOTE  [name] MRN: [id number] DOB: July 08, 1946  Referring provider: Dr. Frazier Richards Primary care provider: Dr. Frazier Richards  Reason for consult:  dementia  Dear Dr Ouida Sills:  Thank you for your kind referral of Lori Blanchard for consultation of the above symptoms. Although her history is well known to you, please allow me to reiterate it for the purpose of our medical record. The patient was accompanied to the clinic by her daughter Lori Blanchard who also provides collateral information. Records and images were personally reviewed where available.  HISTORY OF PRESENT ILLNESS: This is a 73 year old ambidextrous right-hand dominant woman with a history of hypertension, hyperlipidemia, diabetes, hypothyroidism, presenting for second opinion regarding dementia. She has been seeing [facility] since October 2016, last visit was in July 2019. On initial visit with Dr. Manuella Ghazi in October 2016, family reported memory decline starting around 2015. She was becoming more forgetful and family started checking behind her with bill payments. They deny getting lost driving. She had a SLUMS score of 18/30 and was started on Donepezil 5mg  daily. In March 2017, family reported auditory and visual hallucinations of things outside. She had an EEG in April 2017 which showed generalized fronto-central predominant delta slowing of the background. In November 2017, SLUMS score was 17/30, Donepezil increased to 10mg  daily which caused headaches, dizziness, and GI symptoms. Family was reporting more mood disturbances and was started on Lamotrigine for mood stabilization. This caused itching and rash. She was reporting headaches and was started on magnesium. She had an MRI brain in April 2018 which did not show any acute changes. There was note of stable cerebral volume since 2016 with no areas of disproportionate cerebral atrophy, mild to moderate chronic microvascular disease. In  January 2019, family reported decline in memory and vivid dreams and hallucinations seeing her deceased son and parents. Donepezil was stopped and Seroquel 25mg  qhs started. There were no further hallucinations or vivid dreams since them.   Lori Blanchard today reports that she has not noticed significant decline since 2015. The patient states her memory is okay. She lives alone but her daughter stays with her during the day and granddaughter stays with her at night. Lori Blanchard reports she writes her own bills and family checks behind her, she had only missed one bill in the past. If she gets confused, she asks for help. She stopped driving 9.6-7 years ago at Dr. Trena Platt recommendation. Her other daughter fixes her pillbox and she is pretty good with taking them by herself. She does not cook. Over the past 6 months, she is having more agitation and anxiety, she gets mad at herself when she cannot remember. She would start cussing, which is unusual for her. She has been on Wellbutrin XL 150mg  daily for several years. Attempts to stop it in the past caused increased emotionality. Her daughter reports she is very negative and possibly paranoid. Sleep is good, no wandering behavior. She gets upset when family stays with her, she "just wants to be left some time by herself," getting upset when her daughter has to go to the grocery and bring her when she just wants to stay home and do her coloring.   She denies any further significant headaches, no dizziness, diplopia, dysarthria/dysphagia, neck/back pain, focal numbness/tingling/weakness, bowel/bladder dysfunction, anosmia, or tremor. She has left shoulder arthritis. Her daughter feels she has balance issues, she nearly tripped a month ago and her daughter caught her. Her father had dementia in his 77s.  No history of significant head injuries or alcohol use.   Laboratory Data: 06/10/18: TSH 2.865, 10/01/16 B12 399  PAST MEDICAL HISTORY: Past Medical History:  Diagnosis Date    . Alzheimer disease ([place])   . Anxiety   . Anxiety and depression   . Asthma   . Cancer ([place])    Basal Cell Skin Cancer  . CHF (congestive heart failure) ([place])    patient denies this diagnosis  . COPD (chronic obstructive pulmonary disease) ([place])   . Depression   . Diabetes ([place])   . Diverticulosis    patient denies problems for this  . Dyspnea   . Elevated lipids   . Fatty liver   . GERD (gastroesophageal reflux disease)   . Gout    no medication at this time  . History of kidney stones   . Hydronephrosis    stage III ckd  . Hyperlipemia   . Hypertension   . Hypothyroidism   . Lower extremity edema    comes and goes  . Osteoarthritis   . Osteoarthritis   . Osteoporosis   . Pulmonary nodule    has been followed for years, not cancerous. causes breathing issues  . Retroperitoneal fibrosis   . Sleep apnea    CPAP  . Ureter injury   . Ureteropelvic junction (UPJ) obstruction    requires change of ureteral stent approximately every 2 to 3 months    PAST SURGICAL HISTORY: Past Surgical History:  Procedure Laterality Date  . ABDOMINAL HYSTERECTOMY     partial  . ANKLE FRACTURE SURGERY Left    pins in ankle but not in correct location  . APPENDECTOMY  1965  . BREAST SURGERY Bilateral 1990   Reduction  . c setion    . CARPAL TUNNEL RELEASE Bilateral 2000  . CESAREAN SECTION  1448,1856, 1969   x 3  . CYSTOSCOPY W/ RETROGRADES Right 11/02/2015   Procedure: CYSTOSCOPY WITH RETROGRADE PYELOGRAM, URETERAL STENT EXCHANGE;  Surgeon: [name], MD;  Location: [facility];  Service: Urology;  Laterality: Right;  . CYSTOSCOPY W/ RETROGRADES Right 01/31/2016   Procedure: CYSTOSCOPY WITH RETROGRADE PYELOGRAM;  Surgeon: [name], MD;  Location: [facility];  Service: Urology;  Laterality: Right;  . CYSTOSCOPY W/ RETROGRADES Bilateral 05/09/2016   Procedure: CYSTOSCOPY WITH RETROGRADE PYELOGRAM;  Surgeon: [name], MD;  Location: [facility];  Service: Urology;   Laterality: Bilateral;  . CYSTOSCOPY W/ RETROGRADES Left 11/21/2016   Procedure: CYSTOSCOPY WITH RETROGRADE PYELOGRAM;  Surgeon: [name], MD;  Location: [facility];  Service: Urology;  Laterality: Left;  . CYSTOSCOPY W/ RETROGRADES Right 03/27/2017   Procedure: CYSTOSCOPY WITH RETROGRADE PYELOGRAM;  Surgeon: [name], MD;  Location: [facility];  Service: Urology;  Laterality: Right;  . CYSTOSCOPY W/ RETROGRADES Bilateral 05/31/2017   Procedure: CYSTOSCOPY WITH RETROGRADE PYELOGRAM;  Surgeon: [name], MD;  Location: [facility];  Service: Urology;  Laterality: Bilateral;  . CYSTOSCOPY W/ RETROGRADES Right 07/22/2018   Procedure: CYSTOSCOPY WITH RETROGRADE PYELOGRAM;  Surgeon: Abbie Sons, MD;  Location: [facility];  Service: Urology;  Laterality: Right;  . CYSTOSCOPY W/ URETERAL STENT PLACEMENT Right 05/11/2015   Procedure: CYSTOSCOPY WITH STENT REPLACEMENT;  Surgeon: [name], MD;  Location: [facility];  Service: Urology;  Laterality: Right;  . CYSTOSCOPY W/ URETERAL STENT PLACEMENT Right 01/31/2016   Procedure: CYSTOSCOPY, RETROGRADE PYELOGRAMS WITH STENT REPLACEMENT;  Surgeon: [name], MD;  Location: [facility];  Service: Urology;  Laterality: Right;  .  CYSTOSCOPY W/ URETERAL STENT PLACEMENT Right 05/09/2016   Procedure: CYSTOSCOPY WITH STENT REPLACEMENT;  Surgeon: [name], MD;  Location: [facility];  Service: Urology;  Laterality: Right;  . CYSTOSCOPY W/ URETERAL STENT PLACEMENT Bilateral 07/04/2016   Procedure: CYSTOSCOPY WITH STENT REPLACEMENT;  Surgeon: [name], MD;  Location: [facility];  Service: Urology;  Laterality: Bilateral;  . CYSTOSCOPY W/ URETERAL STENT PLACEMENT Right 09/14/2016   Procedure: CYSTOSCOPY WITH STENT REPLACEMENT;  Surgeon: [name], MD;  Location: [facility];  Service: Urology;  Laterality: Right;  . CYSTOSCOPY W/ URETERAL STENT PLACEMENT Right 11/21/2016   Procedure: CYSTOSCOPY WITH STENT REPLACEMENT;   Surgeon: [name], MD;  Location: [facility];  Service: Urology;  Laterality: Right;  . CYSTOSCOPY W/ URETERAL STENT PLACEMENT Right 01/25/2017   Procedure: CYSTOSCOPY WITH STENT REPLACEMENT;  Surgeon: [name], MD;  Location: [facility];  Service: Urology;  Laterality: Right;  . CYSTOSCOPY W/ URETERAL STENT PLACEMENT Right 03/27/2017   Procedure: CYSTOSCOPY WITH STENT REPLACEMENT;  Surgeon: [name], MD;  Location: [facility];  Service: Urology;  Laterality: Right;  . CYSTOSCOPY W/ URETERAL STENT PLACEMENT Right 05/31/2017   Procedure: CYSTOSCOPY WITH STENT REPLACEMENT;  Surgeon: [name], MD;  Location: [facility];  Service: Urology;  Laterality: Right;  . CYSTOSCOPY W/ URETERAL STENT PLACEMENT Right 08/02/2017   Procedure: CYSTOSCOPY WITH STENT REPLACEMENT;  Surgeon: [name], MD;  Location: [facility];  Service: Urology;  Laterality: Right;  . CYSTOSCOPY W/ URETERAL STENT PLACEMENT Right 10/29/2017   Procedure: CYSTOSCOPY WITH STENT REPLACEMENT;  Surgeon: Abbie Sons, MD;  Location: [facility];  Service: Urology;  Laterality: Right;  . CYSTOSCOPY W/ URETERAL STENT PLACEMENT Right 02/14/2018   Procedure: CYSTOSCOPY WITH RETROGRADE PYELOGRAM/URETERAL STENT Exchange;  Surgeon: Abbie Sons, MD;  Location: [facility];  Service: Urology;  Laterality: Right;  . CYSTOSCOPY W/ URETERAL STENT REMOVAL Left 09/14/2016   Procedure: CYSTOSCOPY WITH STENT REMOVAL;  Surgeon: [name], MD;  Location: [facility];  Service: Urology;  Laterality: Left;  . CYSTOSCOPY WITH STENT PLACEMENT Left 05/09/2016   Procedure: CYSTOSCOPY WITH STENT PLACEMENT;  Surgeon: [name], MD;  Location: [facility];  Service: Urology;  Laterality: Left;  . CYSTOSCOPY WITH STENT PLACEMENT Right 07/22/2018   Procedure: CYSTOSCOPY WITH STENT Exchange;  Surgeon: Abbie Sons, MD;  Location: [facility];  Service: Urology;  Laterality: Right;  . EYE SURGERY Left 2018   tear duct  reconstruction  . HERNIA REPAIR  8242   umbilical  . JOINT REPLACEMENT Left 2013   TKR  . JOINT REPLACEMENT Right 2011   TKR  . LAPAROSCOPIC HYSTERECTOMY    . REDUCTION MAMMAPLASTY    . REPLACEMENT TOTAL KNEE Bilateral   . SKIN CANCER EXCISION Left 2018   lower eye lid  . URETEROSCOPY Bilateral 05/09/2016   Procedure: URETEROSCOPY;  Surgeon: [name], MD;  Location: [facility];  Service: Urology;  Laterality: Bilateral;  . URETEROSCOPY WITH HOLMIUM LASER LITHOTRIPSY Left 07/04/2016   Procedure: URETEROSCOPY WITH HOLMIUM LASER LITHOTRIPSY;  Surgeon: [name], MD;  Location: [facility];  Service: Urology;  Laterality: Left;    MEDICATIONS: Current Outpatient Medications on File Prior to Visit  Medication Sig Dispense Refill  . albuterol (PROVENTIL HFA;VENTOLIN HFA) 108 (90 Base) MCG/ACT inhaler Inhale 2 puffs into the lungs every 4 (four) hours as needed for wheezing or shortness of breath.    Marland Kitchen alendronate (FOSAMAX) 70 MG tablet Take 70  mg by mouth every Friday.     Marland Kitchen amoxicillin (AMOXIL) 875 MG tablet Take 1 tablet (875 mg total) by mouth every 12 (twelve) hours. 14 tablet 0  . aspirin EC 81 MG tablet Take 81 mg by mouth daily.     Marland Kitchen atorvastatin (LIPITOR) 40 MG tablet Take 40 mg by mouth daily.     . furosemide (LASIX) 20 MG tablet Take 20 mg by mouth daily.     Marland Kitchen gabapentin (NEURONTIN) 100 MG capsule Take 100 mg by mouth 2 (two) times daily.   0  . HYDROcodone-acetaminophen (NORCO/VICODIN) 5-325 MG tablet Take 1 tablet by mouth every 6 (six) hours as needed for moderate pain. 15 tablet 0  . insulin lispro (HUMALOG) 100 UNIT/ML KiwkPen Inject 12 Units into the skin 3 (three) times daily before meals.     Marland Kitchen LANTUS SOLOSTAR 100 UNIT/ML Solostar Pen Inject 35 Units into the skin at bedtime.     Marland Kitchen levothyroxine (SYNTHROID, LEVOTHROID) 75 MCG tablet Take 75 mcg by mouth daily before breakfast.    . metFORMIN (GLUCOPHAGE) 1000 MG tablet Take 1,000 mg by mouth 2 (two) times  daily with a meal.    . Multiple Vitamin (MULTIVITAMIN WITH MINERALS) TABS tablet Take 1 tablet by mouth daily.     . OXYGEN Inhale 2 L into the lungs at bedtime.    . pantoprazole (PROTONIX) 40 MG tablet Take 40 mg by mouth daily.     . QUEtiapine (SEROQUEL) 25 MG tablet Take 25 mg by mouth at bedtime.    Marland Kitchen telmisartan (MICARDIS) 40 MG tablet Take 40 mg by mouth daily.      No current facility-administered medications on file prior to visit.     ALLERGIES: Allergies  Allergen Reactions  . Ibuprofen Itching, Nausea Only and Rash    FAMILY HISTORY: Family History  Problem Relation Age of Onset  . Liver cancer Mother   . Colon cancer Mother   . Breast cancer Mother 57  . Diabetes Daughter   . Kidney disease Daughter        adrenal tumors  . Breast cancer Maternal Aunt   . Kidney cancer Neg Hx   . Prostate cancer Neg Hx     SOCIAL HISTORY: Social History   Socioeconomic History  . Marital status: Married    Spouse name: Not on file  . Number of children: Not on file  . Years of education: Not on file  . Highest education level: Not on file  Occupational History  . Not on file  Social Needs  . Financial resource strain: Not on file  . Food insecurity:    Worry: Not on file    Inability: Not on file  . Transportation needs:    Medical: Not on file    Non-medical: Not on file  Tobacco Use  . Smoking status: Never Smoker  . Smokeless tobacco: Never Used  Substance and Sexual Activity  . Alcohol use: No    Alcohol/week: 0.0 standard drinks  . Drug use: No  . Sexual activity: Yes    Birth control/protection: Surgical  Lifestyle  . Physical activity:    Days per week: Not on file    Minutes per session: Not on file  . Stress: Not on file  Relationships  . Social connections:    Talks on phone: Not on file    Gets together: Not on file    Attends religious service: Not on file    Active member of  club or organization: Not on file    Attends meetings of clubs  or organizations: Not on file    Relationship status: Not on file  . Intimate partner violence:    Fear of current or ex partner: Not on file    Emotionally abused: Not on file    Physically abused: Not on file    Forced sexual activity: Not on file  Other Topics Concern  . Not on file  Social History Narrative  . Not on file    REVIEW OF SYSTEMS: Constitutional: No fevers, chills, or sweats, no generalized fatigue, change in appetite Eyes: No visual changes, double vision, eye pain Ear, nose and throat: No hearing loss, ear pain, nasal congestion, sore throat Cardiovascular: No chest pain, palpitations Respiratory:  No shortness of breath at rest or with exertion, wheezes GastrointestinaI: No nausea, vomiting, diarrhea, abdominal pain, fecal incontinence Genitourinary:  No dysuria, urinary retention or frequency Musculoskeletal:  No neck pain, back pain Integumentary: No rash, pruritus, skin lesions Neurological: as above Psychiatric: No depression, insomnia, anxiety Endocrine: No palpitations, fatigue, diaphoresis, mood swings, change in appetite, change in weight, increased thirst Hematologic/Lymphatic:  No anemia, purpura, petechiae. Allergic/Immunologic: no itchy/runny eyes, nasal congestion, recent allergic reactions, rashes  PHYSICAL EXAM: Vitals:   09/22/18 1041  BP: (!) 144/62  Pulse: 100  SpO2: (!) 87%   General: No acute distress Head:  Normocephalic/atraumatic Eyes: Fundoscopic exam shows bilateral sharp discs, no vessel changes, exudates, or hemorrhages Neck: supple, no paraspinal tenderness, full range of motion Back: No paraspinal tenderness Heart: regular rate and rhythm Lungs: Clear to auscultation bilaterally. Vascular: No carotid bruits. Skin/Extremities: No rash, no edema Neurological Exam: Mental status: alert and oriented to person, place, and time, no dysarthria or aphasia, Fund of knowledge is appropriate.  Recent and remote memory are impared.   Attention and concentration are normal.    Able to name objects and repeat phrases. CDT 4/5  MMSE - Mini Mental State Exam 09/22/2018  Orientation to time 4  Orientation to Place 3  Registration 3  Attention/ Calculation 5  Recall 2  Language- name 2 objects 2  Language- repeat 0  Language- follow 3 step command 3  Language- read & follow direction 1  Write a sentence 1  Copy design 0  Total score 24   Cranial nerves: CN I: not tested CN II: pupils equal, round and reactive to light, visual fields intact, fundi unremarkable. CN III, IV, VI:  full range of motion, no nystagmus, no ptosis CN V: facial sensation intact CN VII: upper and lower face symmetric CN VIII: hearing intact to finger rub CN IX, X: gag intact, uvula midline CN XI: sternocleidomastoid and trapezius muscles intact CN XII: tongue midline Bulk & Tone: normal, no fasciculations. Motor: 5/5 throughout with no pronator drift. Sensation: intact to light touch, cold, pin on both UE, decreased pin on left LE, intact cold, vibration and joint position sense.  No extinction to double simultaneous stimulation.  Romberg test negative Deep Tendon Reflexes: +1 throughout, no ankle clonus Plantar responses: downgoing bilaterally Cerebellar: no incoordination on finger to nose testing Gait: small steps, narrow-based, reports left knee pain. Unable to tandem walk Tremor: none  IMPRESSION: This is a 73 year old right-handed woman with a history of hypertension, hyperlipidemia, diabetes, hypothyroidism, presenting for second opinion regarding dementia. Her neurological exam is largely non-focal, MMSE today 24/30. MRI brain in 2018 showed age-appropriate volume loss, mild to moderate chronic microvascular disease. She is asking for  a second opinion due to hallucinations/vivid dream symptoms she had from Donepezil that have resolved once Donepezil stopped and Seroquel started. We discussed the diagnosis of mild dementia, likely  Alzheimer's disease. We discussed mood changes that occur with dementia, increase Wellbutrin XL to 300mg  daily, side effects discussed. Continue Seroquel 25mg  qhs. We may start rivastigmine on her next visit. We discussed continued need for home supervision, which she was unhappy about. She does not drive. We discussed the importance of control of vascular risk factors, physical exercise, and brain stimulation exercises for brain health. Follow-up in 6 months, she knows to call for any changes.    Thank you for allowing me to participate in the care of this patient. Please do not hesitate to call for any questions or concerns.   Ellouise Newer, M.D.  CC: Dr. Ouida Sills

## 2018-09-22 NOTE — Patient Instructions (Signed)
1. Increase Wellbutrin XL to 300mg  daily 2. Continue Seroquel 25mg  every night 3. Follow-up in 6 months, call for any changes  FALL PRECAUTIONS: Be cautious when walking. Scan the area for obstacles that may increase the risk of trips and falls. When getting up in the mornings, sit up at the edge of the bed for a few minutes before getting out of bed. Consider elevating the bed at the head end to avoid drop of blood pressure when getting up. Walk always in a well-lit room (use night lights in the walls). Avoid area rugs or power cords from appliances in the middle of the walkways. Use a walker or a cane if necessary and consider physical therapy for balance exercise. Get your eyesight checked regularly.  FINANCIAL OVERSIGHT: Supervision, especially oversight when making financial decisions or transactions is also recommended.  HOME SAFETY: Consider the safety of the kitchen when operating appliances like stoves, microwave oven, and blender. Consider having supervision and share cooking responsibilities until no longer able to participate in those. Accidents with firearms and other hazards in the house should be identified and addressed as well.  ABILITY TO BE LEFT ALONE: If patient is unable to contact 911 operator, consider using LifeLine, or when the need is there, arrange for someone to stay with patients. Smoking is a fire hazard, consider supervision or cessation. Risk of wandering should be assessed by caregiver and if detected at any point, supervision and safe proof recommendations should be instituted.  MEDICATION SUPERVISION: Inability to self-administer medication needs to be constantly addressed. Implement a mechanism to ensure safe administration of the medications.  RECOMMENDATIONS FOR ALL PATIENTS WITH MEMORY PROBLEMS: 1. Continue to exercise (Recommend 30 minutes of walking everyday, or 3 hours every week) 2. Increase social interactions - continue going to [place] and enjoy social  gatherings with friends and family 3. Eat healthy, avoid fried foods and eat more fruits and vegetables 4. Maintain adequate blood pressure, blood sugar, and blood cholesterol level. Reducing the risk of stroke and cardiovascular disease also helps promoting better memory. 5. Avoid stressful situations. Live a simple life and avoid aggravations. Organize your time and prepare for the next day in anticipation. 6. Sleep well, avoid any interruptions of sleep and avoid any distractions in the bedroom that may interfere with adequate sleep quality 7. Avoid sugar, avoid sweets as there is a strong link between excessive sugar intake, diabetes, and cognitive impairment We discussed the Mediterranean diet, which has been shown to help patients reduce the risk of progressive memory disorders and reduces cardiovascular risk. This includes eating fish, eat fruits and green leafy vegetables, nuts like almonds and hazelnuts, walnuts, and also use olive oil. Avoid fast foods and fried foods as much as possible. Avoid sweets and sugar as sugar use has been linked to worsening of memory function.  There is always a concern of gradual progression of memory problems. If this is the case, then we may need to adjust level of care according to patient needs. Support, both to the patient and caregiver, should then be put into place.

## 2018-09-23 ENCOUNTER — Ambulatory Visit: Payer: Medicare Other | Admitting: Urology

## 2018-09-26 ENCOUNTER — Ambulatory Visit: Payer: Medicare Other | Admitting: Urology

## 2018-09-26 ENCOUNTER — Other Ambulatory Visit (HOSPITAL_COMMUNITY): Payer: Self-pay | Admitting: Specialist

## 2018-09-26 ENCOUNTER — Other Ambulatory Visit: Payer: Self-pay | Admitting: Specialist

## 2018-09-26 ENCOUNTER — Telehealth (HOSPITAL_COMMUNITY): Payer: Self-pay

## 2018-09-26 DIAGNOSIS — R918 Other nonspecific abnormal finding of lung field: Secondary | ICD-10-CM

## 2018-09-26 DIAGNOSIS — R059 Cough, unspecified: Secondary | ICD-10-CM

## 2018-09-26 DIAGNOSIS — J449 Chronic obstructive pulmonary disease, unspecified: Secondary | ICD-10-CM

## 2018-09-26 DIAGNOSIS — R05 Cough: Secondary | ICD-10-CM

## 2018-09-26 DIAGNOSIS — R0602 Shortness of breath: Secondary | ICD-10-CM

## 2018-10-01 ENCOUNTER — Ambulatory Visit
Admission: RE | Admit: 2018-10-01 | Discharge: 2018-10-01 | Disposition: A | Discharge status: Home / Self Care | Payer: Medicare Other | Source: Ambulatory Visit | Attending: Specialist | Admitting: Specialist

## 2018-10-01 DIAGNOSIS — R05 Cough: Secondary | ICD-10-CM | POA: Diagnosis not present

## 2018-10-01 DIAGNOSIS — J449 Chronic obstructive pulmonary disease, unspecified: Secondary | ICD-10-CM | POA: Insufficient documentation

## 2018-10-01 DIAGNOSIS — R0602 Shortness of breath: Secondary | ICD-10-CM

## 2018-10-01 DIAGNOSIS — R918 Other nonspecific abnormal finding of lung field: Secondary | ICD-10-CM | POA: Diagnosis not present

## 2018-10-01 DIAGNOSIS — R059 Cough, unspecified: Secondary | ICD-10-CM

## 2018-10-01 DIAGNOSIS — J849 Interstitial pulmonary disease, unspecified: Secondary | ICD-10-CM | POA: Insufficient documentation

## 2018-10-01 DIAGNOSIS — R911 Solitary pulmonary nodule: Secondary | ICD-10-CM | POA: Diagnosis not present

## 2018-10-01 LAB — GLUCOSE, CAPILLARY: Glucose-Capillary: 120 mg/dL — ABNORMAL HIGH (ref 70–99)

## 2018-10-01 MED ORDER — FLUDEOXYGLUCOSE F - 18 (FDG) INJECTION
8.8000 | Freq: Once | INTRAVENOUS | Status: AC | PRN
  Administered 2018-10-01: 8.8 via INTRAVENOUS

## 2018-10-03 ENCOUNTER — Ambulatory Visit: Payer: Medicare Other | Attending: Specialist

## 2018-10-03 DIAGNOSIS — G4733 Obstructive sleep apnea (adult) (pediatric): Secondary | ICD-10-CM | POA: Insufficient documentation

## 2018-10-07 ENCOUNTER — Other Ambulatory Visit: Payer: Self-pay

## 2018-10-07 ENCOUNTER — Encounter
Admission: RE | Admit: 2018-10-07 | Discharge: 2018-10-07 | Disposition: A | Discharge status: Home / Self Care | Payer: Medicare Other | Source: Ambulatory Visit | Attending: Pulmonary Disease | Admitting: Pulmonary Disease

## 2018-10-07 DIAGNOSIS — J449 Chronic obstructive pulmonary disease, unspecified: Secondary | ICD-10-CM | POA: Diagnosis not present

## 2018-10-07 DIAGNOSIS — G4730 Sleep apnea, unspecified: Secondary | ICD-10-CM | POA: Diagnosis not present

## 2018-10-07 DIAGNOSIS — E039 Hypothyroidism, unspecified: Secondary | ICD-10-CM | POA: Diagnosis not present

## 2018-10-07 DIAGNOSIS — E1122 Type 2 diabetes mellitus with diabetic chronic kidney disease: Secondary | ICD-10-CM | POA: Diagnosis not present

## 2018-10-07 DIAGNOSIS — Z833 Family history of diabetes mellitus: Secondary | ICD-10-CM | POA: Diagnosis not present

## 2018-10-07 DIAGNOSIS — J9621 Acute and chronic respiratory failure with hypoxia: Secondary | ICD-10-CM | POA: Diagnosis not present

## 2018-10-07 DIAGNOSIS — R0600 Dyspnea, unspecified: Secondary | ICD-10-CM | POA: Diagnosis present

## 2018-10-07 DIAGNOSIS — Z01818 Encounter for other preprocedural examination: Secondary | ICD-10-CM

## 2018-10-07 DIAGNOSIS — F419 Anxiety disorder, unspecified: Secondary | ICD-10-CM | POA: Diagnosis not present

## 2018-10-07 DIAGNOSIS — F0280 Dementia in other diseases classified elsewhere without behavioral disturbance: Secondary | ICD-10-CM | POA: Diagnosis not present

## 2018-10-07 DIAGNOSIS — I509 Heart failure, unspecified: Secondary | ICD-10-CM | POA: Diagnosis not present

## 2018-10-07 DIAGNOSIS — K219 Gastro-esophageal reflux disease without esophagitis: Secondary | ICD-10-CM | POA: Diagnosis not present

## 2018-10-07 DIAGNOSIS — G309 Alzheimer's disease, unspecified: Secondary | ICD-10-CM | POA: Diagnosis not present

## 2018-10-07 DIAGNOSIS — N183 Chronic kidney disease, stage 3 (moderate): Secondary | ICD-10-CM | POA: Diagnosis not present

## 2018-10-07 DIAGNOSIS — Z803 Family history of malignant neoplasm of breast: Secondary | ICD-10-CM | POA: Diagnosis not present

## 2018-10-07 DIAGNOSIS — Z886 Allergy status to analgesic agent status: Secondary | ICD-10-CM | POA: Diagnosis not present

## 2018-10-07 DIAGNOSIS — M109 Gout, unspecified: Secondary | ICD-10-CM | POA: Diagnosis not present

## 2018-10-07 DIAGNOSIS — F329 Major depressive disorder, single episode, unspecified: Secondary | ICD-10-CM | POA: Diagnosis not present

## 2018-10-07 DIAGNOSIS — E785 Hyperlipidemia, unspecified: Secondary | ICD-10-CM | POA: Diagnosis not present

## 2018-10-07 DIAGNOSIS — K760 Fatty (change of) liver, not elsewhere classified: Secondary | ICD-10-CM | POA: Diagnosis not present

## 2018-10-07 DIAGNOSIS — Z85828 Personal history of other malignant neoplasm of skin: Secondary | ICD-10-CM | POA: Diagnosis not present

## 2018-10-07 DIAGNOSIS — M810 Age-related osteoporosis without current pathological fracture: Secondary | ICD-10-CM | POA: Diagnosis not present

## 2018-10-07 DIAGNOSIS — I10 Essential (primary) hypertension: Secondary | ICD-10-CM | POA: Insufficient documentation

## 2018-10-07 DIAGNOSIS — Z800 Family history of malignant neoplasm of digestive organs: Secondary | ICD-10-CM | POA: Diagnosis not present

## 2018-10-07 DIAGNOSIS — I130 Hypertensive heart and chronic kidney disease with heart failure and stage 1 through stage 4 chronic kidney disease, or unspecified chronic kidney disease: Secondary | ICD-10-CM | POA: Diagnosis not present

## 2018-10-07 DIAGNOSIS — K5790 Diverticulosis of intestine, part unspecified, without perforation or abscess without bleeding: Secondary | ICD-10-CM | POA: Diagnosis not present

## 2018-10-07 DIAGNOSIS — M1990 Unspecified osteoarthritis, unspecified site: Secondary | ICD-10-CM | POA: Diagnosis not present

## 2018-10-07 LAB — BASIC METABOLIC PANEL
Anion gap: 9 (ref 5–15)
BUN: 16 mg/dL (ref 8–23)
CO2: 23 mmol/L (ref 22–32)
Calcium: 8.7 mg/dL — ABNORMAL LOW (ref 8.9–10.3)
Chloride: 109 mmol/L (ref 98–111)
Creatinine, Ser: 1 mg/dL (ref 0.44–1.00)
GFR calc non Af Amer: 56 mL/min — ABNORMAL LOW (ref >60)
Glucose, Bld: 150 mg/dL — ABNORMAL HIGH (ref 70–99)
Potassium: 3.8 mmol/L (ref 3.5–5.1)
Sodium: 141 mmol/L (ref 135–145)

## 2018-10-07 LAB — PROTIME-INR
INR: 1 (ref 0.8–1.2)
Prothrombin Time: 12.9 seconds (ref 11.4–15.2)

## 2018-10-07 LAB — CBC
HCT: 36.2 % (ref 36.0–46.0)
Hemoglobin: 10.5 g/dL — ABNORMAL LOW (ref 12.0–15.0)
MCH: 22 pg — AB (ref 26.0–34.0)
MCHC: 29 g/dL — ABNORMAL LOW (ref 30.0–36.0)
MCV: 75.7 fL — ABNORMAL LOW (ref 80.0–100.0)
Platelets: 333 10*3/uL (ref 150–400)
RBC: 4.78 MIL/uL (ref 3.87–5.11)
RDW: 16.1 % — ABNORMAL HIGH (ref 11.5–15.5)
WBC: 11.6 10*3/uL — ABNORMAL HIGH (ref 4.0–10.5)
nRBC: 0 % (ref 0.0–0.2)

## 2018-10-07 LAB — APTT: aPTT: 27 seconds (ref 24–36)

## 2018-10-07 NOTE — Patient Instructions (Signed)
Your procedure is scheduled on: October 10, 2018 FRIDAY Report to Day Surgery on the 2nd floor of the [facility]. To find out your arrival time, please call [phone] between 1PM - 3PM on: Thursday October 09, 2018  REMEMBER: Instructions that are not followed completely may result in serious medical risk, up to and including death; or upon the discretion of your surgeon and anesthesiologist your surgery may need to be rescheduled.  Do not eat food after midnight the night before surgery.  No gum chewing, lozengers or hard candies.  You may however, drink CLEAR liquids up to 2 hours before you are scheduled to arrive for your surgery. Do not drink anything within 2 hours of the start of your surgery.  Clear liquids include: - water   Do NOT drink anything that is not on this list.  Type 1 and Type 2 diabetics should only drink water.  No Alcohol for 24 hours before or after surgery.  No Smoking including e-cigarettes for 24 hours prior to surgery.  No chewable tobacco products for at least 6 hours prior to surgery.  No nicotine patches on the day of surgery.  On the morning of surgery brush your teeth with toothpaste and water, you may rinse your mouth with mouthwash if you wish. Do not swallow any toothpaste or mouthwash.  Notify your doctor if there is any change in your medical condition (cold, fever, infection).  Do not wear jewelry, make-up, hairpins, clips or nail polish.  Do not wear lotions, powders, or perfumes.   Do not shave 48 hours prior to surgery.   Contacts and dentures may not be worn into surgery.  Do not bring valuables to the hospital, including drivers license, insurance or credit cards.  [facility] is not responsible for any belongings or valuables.   TAKE THESE MEDICATIONS THE MORNING OF SURGERY: LEVOTHYROXONE PANTOPRAZOLE(PROTONIX) (take one the night before and one on the morning of surgery - helps to prevent nausea after  surgery.) FOSAMAX LIPITOR GABAPENTIN BUPROPION   Use inhalers on the day of surgery   Bring your C-PAP to the hospital with you in case you may have to spend the night.   Stop Metformin  2 days prior to surgery.  Take 1/2 of usual insulin dose the night before surgery and none on the morning of surgery.  Follow recommendations from Cardiologist, Pulmonologist or PCP regarding stopping Aspirin  Stop Anti-inflammatories (NSAIDS) such as Advil, Aleve, Ibuprofen, Motrin, Naproxen, Naprosyn and Aspirin based products such as Excedrin, Goodys Powder, BC Powder. (May take Tylenol or Acetaminophen if needed.)  Stop ANY OVER THE COUNTER supplements until after surgery. (May continue Vitamin D, Vitamin B, and multivitamin.)  Wear comfortable clothing (specific to your surgery type) to the hospital.  Plan for stool softeners for home use.  If you are being admitted to the hospital overnight, leave your suitcase in the car. After surgery it may be brought to your room.  If you are being discharged the day of surgery, you will not be allowed to drive home. You will need a responsible adult to drive you home and stay with you that night.      Please call [phone] if you have any questions about these instructions.

## 2018-10-10 ENCOUNTER — Encounter: Payer: Self-pay | Admitting: Pulmonary Disease

## 2018-10-10 ENCOUNTER — Ambulatory Visit: Payer: Medicare Other

## 2018-10-10 ENCOUNTER — Ambulatory Visit: Payer: Medicare Other | Admitting: Anesthesiology

## 2018-10-10 ENCOUNTER — Inpatient Hospital Stay
Admission: RE | Admit: 2018-10-10 | Discharge: 2018-10-11 | DRG: 056 | Disposition: A | Discharge status: Home / Self Care | Payer: Medicare Other | Attending: Internal Medicine | Admitting: Internal Medicine

## 2018-10-10 ENCOUNTER — Other Ambulatory Visit: Payer: Self-pay

## 2018-10-10 ENCOUNTER — Encounter
Admission: RE | Disposition: A | Discharge status: Home / Self Care | Payer: Self-pay | Source: Home / Self Care | Attending: Internal Medicine

## 2018-10-10 ENCOUNTER — Encounter: Payer: Self-pay | Admitting: Anesthesiology

## 2018-10-10 DIAGNOSIS — F418 Other specified anxiety disorders: Secondary | ICD-10-CM | POA: Diagnosis present

## 2018-10-10 DIAGNOSIS — F0280 Dementia in other diseases classified elsewhere without behavioral disturbance: Secondary | ICD-10-CM | POA: Diagnosis not present

## 2018-10-10 DIAGNOSIS — M810 Age-related osteoporosis without current pathological fracture: Secondary | ICD-10-CM | POA: Diagnosis present

## 2018-10-10 DIAGNOSIS — F329 Major depressive disorder, single episode, unspecified: Secondary | ICD-10-CM | POA: Diagnosis present

## 2018-10-10 DIAGNOSIS — N183 Chronic kidney disease, stage 3 (moderate): Secondary | ICD-10-CM | POA: Diagnosis not present

## 2018-10-10 DIAGNOSIS — I130 Hypertensive heart and chronic kidney disease with heart failure and stage 1 through stage 4 chronic kidney disease, or unspecified chronic kidney disease: Secondary | ICD-10-CM | POA: Diagnosis not present

## 2018-10-10 DIAGNOSIS — M1990 Unspecified osteoarthritis, unspecified site: Secondary | ICD-10-CM | POA: Diagnosis not present

## 2018-10-10 DIAGNOSIS — R0600 Dyspnea, unspecified: Secondary | ICD-10-CM | POA: Diagnosis present

## 2018-10-10 DIAGNOSIS — M109 Gout, unspecified: Secondary | ICD-10-CM | POA: Diagnosis not present

## 2018-10-10 DIAGNOSIS — E1122 Type 2 diabetes mellitus with diabetic chronic kidney disease: Secondary | ICD-10-CM | POA: Diagnosis not present

## 2018-10-10 DIAGNOSIS — G4730 Sleep apnea, unspecified: Secondary | ICD-10-CM | POA: Diagnosis present

## 2018-10-10 DIAGNOSIS — E039 Hypothyroidism, unspecified: Secondary | ICD-10-CM | POA: Diagnosis present

## 2018-10-10 DIAGNOSIS — Z800 Family history of malignant neoplasm of digestive organs: Secondary | ICD-10-CM

## 2018-10-10 DIAGNOSIS — K219 Gastro-esophageal reflux disease without esophagitis: Secondary | ICD-10-CM | POA: Diagnosis not present

## 2018-10-10 DIAGNOSIS — J9621 Acute and chronic respiratory failure with hypoxia: Secondary | ICD-10-CM | POA: Diagnosis not present

## 2018-10-10 DIAGNOSIS — Z794 Long term (current) use of insulin: Secondary | ICD-10-CM

## 2018-10-10 DIAGNOSIS — Z79890 Hormone replacement therapy (postmenopausal): Secondary | ICD-10-CM

## 2018-10-10 DIAGNOSIS — J449 Chronic obstructive pulmonary disease, unspecified: Secondary | ICD-10-CM | POA: Diagnosis not present

## 2018-10-10 DIAGNOSIS — G309 Alzheimer's disease, unspecified: Secondary | ICD-10-CM | POA: Diagnosis not present

## 2018-10-10 DIAGNOSIS — F419 Anxiety disorder, unspecified: Secondary | ICD-10-CM | POA: Diagnosis present

## 2018-10-10 DIAGNOSIS — I509 Heart failure, unspecified: Secondary | ICD-10-CM | POA: Diagnosis not present

## 2018-10-10 DIAGNOSIS — Z833 Family history of diabetes mellitus: Secondary | ICD-10-CM

## 2018-10-10 DIAGNOSIS — Z9981 Dependence on supplemental oxygen: Secondary | ICD-10-CM

## 2018-10-10 DIAGNOSIS — K760 Fatty (change of) liver, not elsewhere classified: Secondary | ICD-10-CM | POA: Diagnosis present

## 2018-10-10 DIAGNOSIS — E785 Hyperlipidemia, unspecified: Secondary | ICD-10-CM | POA: Diagnosis present

## 2018-10-10 DIAGNOSIS — Z7983 Long term (current) use of bisphosphonates: Secondary | ICD-10-CM

## 2018-10-10 DIAGNOSIS — Z803 Family history of malignant neoplasm of breast: Secondary | ICD-10-CM

## 2018-10-10 DIAGNOSIS — Z85828 Personal history of other malignant neoplasm of skin: Secondary | ICD-10-CM | POA: Diagnosis not present

## 2018-10-10 DIAGNOSIS — Z7982 Long term (current) use of aspirin: Secondary | ICD-10-CM

## 2018-10-10 DIAGNOSIS — R0902 Hypoxemia: Secondary | ICD-10-CM

## 2018-10-10 DIAGNOSIS — K5790 Diverticulosis of intestine, part unspecified, without perforation or abscess without bleeding: Secondary | ICD-10-CM | POA: Diagnosis not present

## 2018-10-10 DIAGNOSIS — Z886 Allergy status to analgesic agent status: Secondary | ICD-10-CM

## 2018-10-10 DIAGNOSIS — R0603 Acute respiratory distress: Secondary | ICD-10-CM | POA: Diagnosis present

## 2018-10-10 LAB — GLUCOSE, CAPILLARY
GLUCOSE-CAPILLARY: 151 mg/dL — AB (ref 70–99)
GLUCOSE-CAPILLARY: 385 mg/dL — AB (ref 70–99)
Glucose-Capillary: 166 mg/dL — ABNORMAL HIGH (ref 70–99)
Glucose-Capillary: 352 mg/dL — ABNORMAL HIGH (ref 70–99)

## 2018-10-10 SURGERY — VIDEO BRONCHOSCOPY WITH RADIAL ENDOBRONCHIAL ULTRASOUND
Anesthesia: General

## 2018-10-10 MED ORDER — ROCURONIUM BROMIDE 100 MG/10ML IV SOLN
INTRAVENOUS | Status: DC | PRN
  Administered 2018-10-10: 50 mg via INTRAVENOUS

## 2018-10-10 MED ORDER — DEXAMETHASONE SODIUM PHOSPHATE 4 MG/ML IJ SOLN
INTRAMUSCULAR | Status: AC
  Filled 2018-10-10: qty 1

## 2018-10-10 MED ORDER — IPRATROPIUM-ALBUTEROL 0.5-2.5 (3) MG/3ML IN SOLN
RESPIRATORY_TRACT | Status: AC
  Administered 2018-10-10: 3 mL via RESPIRATORY_TRACT
  Filled 2018-10-10: qty 3

## 2018-10-10 MED ORDER — INSULIN GLARGINE 100 UNIT/ML ~~LOC~~ SOLN
15.0000 [IU] | Freq: Every day | SUBCUTANEOUS | Status: DC
  Administered 2018-10-10: 15 [IU] via SUBCUTANEOUS
  Filled 2018-10-10 (×2): qty 0.15

## 2018-10-10 MED ORDER — SUGAMMADEX SODIUM 200 MG/2ML IV SOLN
INTRAVENOUS | Status: DC | PRN
  Administered 2018-10-10: 200 mg via INTRAVENOUS

## 2018-10-10 MED ORDER — IRBESARTAN 150 MG PO TABS
150.0000 mg | ORAL_TABLET | Freq: Every day | ORAL | Status: DC
  Administered 2018-10-11: 150 mg via ORAL
  Filled 2018-10-10 (×2): qty 1

## 2018-10-10 MED ORDER — FENTANYL CITRATE (PF) 100 MCG/2ML IJ SOLN
INTRAMUSCULAR | Status: DC | PRN
  Administered 2018-10-10: 50 ug via INTRAVENOUS
  Administered 2018-10-10 (×2): 25 ug via INTRAVENOUS

## 2018-10-10 MED ORDER — ALBUTEROL SULFATE (2.5 MG/3ML) 0.083% IN NEBU: 2.5000 mg | INHALATION_SOLUTION | RESPIRATORY_TRACT | Status: DC | PRN

## 2018-10-10 MED ORDER — PHENYLEPHRINE HCL 10 MG/ML IJ SOLN
INTRAMUSCULAR | Status: DC | PRN
  Administered 2018-10-10 (×8): 100 ug via INTRAVENOUS
  Administered 2018-10-10 (×2): 200 ug via INTRAVENOUS

## 2018-10-10 MED ORDER — POLYETHYLENE GLYCOL 3350 17 G PO PACK: 17.0000 g | PACK | Freq: Every day | ORAL | Status: DC | PRN

## 2018-10-10 MED ORDER — ASPIRIN EC 81 MG PO TBEC
81.0000 mg | DELAYED_RELEASE_TABLET | Freq: Every day | ORAL | Status: DC
  Administered 2018-10-11: 81 mg via ORAL
  Filled 2018-10-10: qty 1

## 2018-10-10 MED ORDER — BUPROPION HCL ER (XL) 150 MG PO TB24
300.0000 mg | ORAL_TABLET | Freq: Every day | ORAL | Status: DC
  Administered 2018-10-11: 300 mg via ORAL
  Filled 2018-10-10: qty 2

## 2018-10-10 MED ORDER — ACETAMINOPHEN 325 MG PO TABS: 650.0000 mg | ORAL_TABLET | Freq: Four times a day (QID) | ORAL | Status: DC | PRN

## 2018-10-10 MED ORDER — FUROSEMIDE 20 MG PO TABS
20.0000 mg | ORAL_TABLET | Freq: Every day | ORAL | Status: DC
  Administered 2018-10-11: 20 mg via ORAL
  Filled 2018-10-10: qty 1

## 2018-10-10 MED ORDER — IPRATROPIUM-ALBUTEROL 0.5-2.5 (3) MG/3ML IN SOLN
3.0000 mL | RESPIRATORY_TRACT | Status: DC
  Administered 2018-10-10: 3 mL via RESPIRATORY_TRACT

## 2018-10-10 MED ORDER — INSULIN ASPART 100 UNIT/ML ~~LOC~~ SOLN
SUBCUTANEOUS | Status: AC
  Filled 2018-10-10: qty 1

## 2018-10-10 MED ORDER — LIDOCAINE HCL 2 % EX GEL
1.0000 "application " | Freq: Once | CUTANEOUS | Status: DC
  Filled 2018-10-10: qty 4250

## 2018-10-10 MED ORDER — ESMOLOL HCL 100 MG/10ML IV SOLN
INTRAVENOUS | Status: DC | PRN
  Administered 2018-10-10 (×2): 20 mg via INTRAVENOUS

## 2018-10-10 MED ORDER — ONDANSETRON HCL 4 MG/2ML IJ SOLN
INTRAMUSCULAR | Status: AC
  Filled 2018-10-10: qty 2

## 2018-10-10 MED ORDER — LEVOTHYROXINE SODIUM 50 MCG PO TABS
75.0000 ug | ORAL_TABLET | Freq: Every day | ORAL | Status: DC
  Administered 2018-10-11: 75 ug via ORAL
  Filled 2018-10-10: qty 2

## 2018-10-10 MED ORDER — SUGAMMADEX SODIUM 200 MG/2ML IV SOLN
INTRAVENOUS | Status: AC
  Filled 2018-10-10: qty 2

## 2018-10-10 MED ORDER — INSULIN REGULAR HUMAN 100 UNIT/ML IJ SOLN
10.0000 [IU] | Freq: Once | INTRAMUSCULAR | Status: AC
  Administered 2018-10-11: 10 [IU] via INTRAVENOUS
  Filled 2018-10-10: qty 10

## 2018-10-10 MED ORDER — PROPOFOL 10 MG/ML IV BOLUS
INTRAVENOUS | Status: DC | PRN
  Administered 2018-10-10: 150 mg via INTRAVENOUS

## 2018-10-10 MED ORDER — BUTAMBEN-TETRACAINE-BENZOCAINE 2-2-14 % EX AERO
1.0000 | INHALATION_SPRAY | Freq: Once | CUTANEOUS | Status: DC
  Filled 2018-10-10: qty 20

## 2018-10-10 MED ORDER — ATORVASTATIN CALCIUM 20 MG PO TABS
40.0000 mg | ORAL_TABLET | Freq: Every day | ORAL | Status: DC
  Administered 2018-10-11: 40 mg via ORAL
  Filled 2018-10-10: qty 2

## 2018-10-10 MED ORDER — LIDOCAINE HCL (CARDIAC) PF 100 MG/5ML IV SOSY
PREFILLED_SYRINGE | INTRAVENOUS | Status: DC | PRN
  Administered 2018-10-10: 50 mg via INTRAVENOUS

## 2018-10-10 MED ORDER — ENOXAPARIN SODIUM 40 MG/0.4ML ~~LOC~~ SOLN
40.0000 mg | SUBCUTANEOUS | Status: DC
  Administered 2018-10-11: 40 mg via SUBCUTANEOUS
  Filled 2018-10-10: qty 0.4

## 2018-10-10 MED ORDER — INSULIN ASPART 100 UNIT/ML ~~LOC~~ SOLN
0.0000 [IU] | Freq: Every day | SUBCUTANEOUS | Status: DC
  Administered 2018-10-10: 5 [IU] via SUBCUTANEOUS
  Filled 2018-10-10: qty 1

## 2018-10-10 MED ORDER — ONDANSETRON HCL 4 MG/2ML IJ SOLN
INTRAMUSCULAR | Status: DC | PRN
  Administered 2018-10-10: 4 mg via INTRAVENOUS

## 2018-10-10 MED ORDER — FENTANYL CITRATE (PF) 100 MCG/2ML IJ SOLN: 25.0000 ug | INTRAMUSCULAR | Status: DC | PRN

## 2018-10-10 MED ORDER — SUCCINYLCHOLINE CHLORIDE 20 MG/ML IJ SOLN
INTRAMUSCULAR | Status: DC | PRN
  Administered 2018-10-10: 100 mg via INTRAVENOUS

## 2018-10-10 MED ORDER — SODIUM CHLORIDE 0.9 % IV SOLN
INTRAVENOUS | Status: DC
  Administered 2018-10-10: 12:00:00 via INTRAVENOUS

## 2018-10-10 MED ORDER — FUROSEMIDE 10 MG/ML IJ SOLN
INTRAMUSCULAR | Status: AC
  Administered 2018-10-10: 20 mg via INTRAVENOUS
  Filled 2018-10-10: qty 2

## 2018-10-10 MED ORDER — FUROSEMIDE 10 MG/ML IJ SOLN
20.0000 mg | Freq: Once | INTRAMUSCULAR | Status: AC
  Administered 2018-10-10: 20 mg via INTRAVENOUS

## 2018-10-10 MED ORDER — QUETIAPINE FUMARATE 25 MG PO TABS
25.0000 mg | ORAL_TABLET | Freq: Every day | ORAL | Status: DC
  Administered 2018-10-10: 25 mg via ORAL
  Filled 2018-10-10: qty 1

## 2018-10-10 MED ORDER — DEXAMETHASONE SODIUM PHOSPHATE 10 MG/ML IJ SOLN
INTRAMUSCULAR | Status: DC | PRN
  Administered 2018-10-10: 4 mg via INTRAVENOUS

## 2018-10-10 MED ORDER — PHENYLEPHRINE HCL 0.25 % NA SOLN
1.0000 | Freq: Four times a day (QID) | NASAL | Status: DC | PRN
  Filled 2018-10-10: qty 15

## 2018-10-10 MED ORDER — PANTOPRAZOLE SODIUM 40 MG PO TBEC
40.0000 mg | DELAYED_RELEASE_TABLET | Freq: Every day | ORAL | Status: DC
  Administered 2018-10-11: 40 mg via ORAL
  Filled 2018-10-10: qty 1

## 2018-10-10 MED ORDER — INSULIN ASPART 100 UNIT/ML ~~LOC~~ SOLN
0.0000 [IU] | Freq: Three times a day (TID) | SUBCUTANEOUS | Status: DC
  Administered 2018-10-11: 3 [IU] via SUBCUTANEOUS
  Filled 2018-10-10: qty 1

## 2018-10-10 MED ORDER — ACETAMINOPHEN 650 MG RE SUPP: 650.0000 mg | Freq: Four times a day (QID) | RECTAL | Status: DC | PRN

## 2018-10-10 MED ORDER — ONDANSETRON HCL 4 MG PO TABS: 4.0000 mg | ORAL_TABLET | Freq: Four times a day (QID) | ORAL | Status: DC | PRN

## 2018-10-10 MED ORDER — ROCURONIUM BROMIDE 50 MG/5ML IV SOLN
INTRAVENOUS | Status: AC
  Filled 2018-10-10: qty 1

## 2018-10-10 MED ORDER — UMECLIDINIUM-VILANTEROL 62.5-25 MCG/INH IN AEPB
1.0000 | INHALATION_SPRAY | Freq: Every day | RESPIRATORY_TRACT | Status: DC
  Administered 2018-10-11: 1 via RESPIRATORY_TRACT
  Filled 2018-10-10: qty 14

## 2018-10-10 MED ORDER — GABAPENTIN 100 MG PO CAPS
100.0000 mg | ORAL_CAPSULE | Freq: Two times a day (BID) | ORAL | Status: DC
  Administered 2018-10-10 – 2018-10-11 (×2): 100 mg via ORAL
  Filled 2018-10-10 (×2): qty 1

## 2018-10-10 MED ORDER — SODIUM CHLORIDE FLUSH 0.9 % IV SOLN
INTRAVENOUS | Status: AC
  Administered 2018-10-10: 10 mL
  Filled 2018-10-10: qty 10

## 2018-10-10 MED ORDER — FENTANYL CITRATE (PF) 100 MCG/2ML IJ SOLN
INTRAMUSCULAR | Status: AC
  Filled 2018-10-10: qty 2

## 2018-10-10 MED ORDER — ONDANSETRON HCL 4 MG/2ML IJ SOLN: 4.0000 mg | Freq: Once | INTRAMUSCULAR | Status: DC | PRN

## 2018-10-10 MED ORDER — LIDOCAINE HCL (PF) 1 % IJ SOLN
30.0000 mL | Freq: Once | INTRAMUSCULAR | Status: DC
  Filled 2018-10-10: qty 30

## 2018-10-10 MED ORDER — IPRATROPIUM-ALBUTEROL 0.5-2.5 (3) MG/3ML IN SOLN
3.0000 mL | Freq: Four times a day (QID) | RESPIRATORY_TRACT | Status: DC
  Filled 2018-10-10: qty 3

## 2018-10-10 MED ORDER — ONDANSETRON HCL 4 MG/2ML IJ SOLN: 4.0000 mg | Freq: Four times a day (QID) | INTRAMUSCULAR | Status: DC | PRN

## 2018-10-10 NOTE — Transfer of Care (Signed)
Immediate Anesthesia Transfer of Care Note  Patient: Lori Blanchard  Procedure(s) Performed: BRONCHOSCOPY WITH ENDOBRONCHIAL ULTRASOUND (N/A )  Patient Location: PACU  Anesthesia Type:General  Level of Consciousness: awake  Airway & Oxygen Therapy: Patient connected to face mask oxygen  Post-op Assessment: Post -op Vital signs reviewed and stable  Post vital signs: stable  Last Vitals:  Vitals Value Taken Time  BP 153/67 10/10/2018  3:22 PM  Temp    Pulse 108 10/10/2018  3:22 PM  Resp 20 10/10/2018  3:22 PM  SpO2 95 % 10/10/2018  3:22 PM    Last Pain:  Vitals:   10/10/18 1146  TempSrc: Oral  PainSc: 0-No pain         Complications: No apparent anesthesia complications

## 2018-10-10 NOTE — Anesthesia Preprocedure Evaluation (Signed)
Anesthesia Evaluation  Patient identified by MRN, date of birth, ID band Patient awake    Reviewed: Allergy & Precautions, NPO status , Patient's Chart, lab work & pertinent test results, reviewed documented beta blocker date and time   Airway Mallampati: III  TM Distance: >3 FB     Dental  (+) Chipped, Missing   Pulmonary shortness of breath, asthma , sleep apnea and Continuous Positive Airway Pressure Ventilation , COPD,  COPD inhaler,           Cardiovascular hypertension, Pt. on medications +CHF       Neuro/Psych    GI/Hepatic GERD  Controlled,  Endo/Other  diabetes, Type 2Hypothyroidism   Renal/GU Renal disease     Musculoskeletal   Abdominal   Peds  Hematology  (+) anemia ,   Anesthesia Other Findings Gout. EKG ok.  Reproductive/Obstetrics                             Anesthesia Physical Anesthesia Plan  ASA: III  Anesthesia Plan: General   Post-op Pain Management:    Induction: Intravenous  PONV Risk Score and Plan:   Airway Management Planned: Oral ETT  Additional Equipment:   Intra-op Plan:   Post-operative Plan:   Informed Consent: I have reviewed the patients History and Physical, chart, labs and discussed the procedure including the risks, benefits and alternatives for the proposed anesthesia with the patient or authorized representative who has indicated his/her understanding and acceptance.       Plan Discussed with: CRNA  Anesthesia Plan Comments:         Anesthesia Quick Evaluation

## 2018-10-10 NOTE — Anesthesia Procedure Notes (Signed)
Procedure Name: Intubation Date/Time: 10/10/2018 2:05 PM Performed by: Timoteo Expose, CRNA Pre-anesthesia Checklist: Patient identified and Emergency Drugs available Patient Re-evaluated:Patient Re-evaluated prior to induction Oxygen Delivery Method: Circle system utilized Preoxygenation: Pre-oxygenation with 100% oxygen Induction Type: IV induction Ventilation: Mask ventilation without difficulty Laryngoscope Size: McGraph and 3 Grade View: Grade II Tube size: 9.0 mm Number of attempts: 1 Airway Equipment and Method: Stylet Placement Confirmation: ETT inserted through vocal cords under direct vision,  positive ETCO2,  CO2 detector and breath sounds checked- equal and bilateral Secured at: 18 cm Tube secured with: Tape Dental Injury: Teeth and Oropharynx as per pre-operative assessment

## 2018-10-10 NOTE — Anesthesia Postprocedure Evaluation (Signed)
Anesthesia Post Note  Patient: [place]  Procedure(s) Performed: BRONCHOSCOPY WITH ENDOBRONCHIAL ULTRASOUND (N/A )  Patient location during evaluation: PACU Anesthesia Type: General Level of consciousness: awake and alert Pain management: pain level controlled Vital Signs Assessment: post-procedure vital signs reviewed and stable Respiratory status: spontaneous breathing, nonlabored ventilation, respiratory function stable and patient connected to nasal cannula oxygen Cardiovascular status: blood pressure returned to baseline and stable Postop Assessment: no apparent nausea or vomiting Anesthetic complications: no Comments: Patient a little hypoxic after procedure, resolved with O2 and duoneb.  Patient is to be admitted overnight for monitoring to ensure no further hypoxic events.     Last Vitals:  Vitals:   10/10/18 1903 10/10/18 1915  BP: (!) 146/72   Pulse: 100 (!) 101  Resp: (!) 30 20  Temp:    SpO2: 94% 92%    Last Pain:  Vitals:   10/10/18 1903  TempSrc:   PainSc: 0-No pain                 Martha Clan

## 2018-10-10 NOTE — H&P (Signed)
[facility] [place] NAME: Lori Blanchard    MR#:  [id number]  DATE OF BIRTH:  11/20/1945  DATE OF ADMISSION:  10/10/2018  PRIMARY CARE PHYSICIAN: [name], MD   REQUESTING/REFERRING PHYSICIAN: Ottie Glazier, MD  CHIEF COMPLAINT:  Shortness of breath and hypoxia after bronchoscopy   HISTORY OF PRESENT ILLNESS:  Lori Blanchard  is a 73 y.o. female with a known history of chronic respiratory failure due to COPD and ILD, HTN, T2DM, HLD, dementia, anxiety, depression who underwent flexible bronchoscopy and EBUS for lymph node biopsy and staging this afternoon. She has a RLL lung mass suspicious for possible lung cancer. After her bronchoscopy, she was noted to have hypoxia. She was placed on NRB. CXR was performed and did not show a pneumothorax. She was given IV lasix 1m x 1. Hospitalists were called for admission.  PAST MEDICAL HISTORY:   Past Medical History:  Diagnosis Date  . Alzheimer disease ([facility]   . Anxiety   . Anxiety and depression   . Asthma   . Cancer ([facility]    Basal Cell Skin Cancer  . CHF (congestive heart failure) ([facility]    patient denies this diagnosis  . COPD (chronic obstructive pulmonary disease) ([facility]   . Depression   . Diabetes ([facility]   . Diverticulosis    patient denies problems for this  . Dyspnea   . Elevated lipids   . Fatty liver   . GERD (gastroesophageal reflux disease)   . Gout    no medication at this time  . History of kidney stones   . Hydronephrosis    stage III ckd  . Hyperlipemia   . Hypertension   . Hypothyroidism   . Lower extremity edema    comes and goes  . Osteoarthritis   . Osteoarthritis   . Osteoporosis   . Pulmonary nodule    has been followed for years, not cancerous. causes breathing issues  . Retroperitoneal fibrosis   . Sleep apnea    CPAP  . Ureter injury   . Ureteropelvic junction (UPJ) obstruction    requires change of ureteral stent approximately every 2 to 3  months    PAST SURGICAL HISTORY:   Past Surgical History:  Procedure Laterality Date  . ABDOMINAL HYSTERECTOMY     partial  . ANKLE FRACTURE SURGERY Left    pins in ankle but not in correct location  . APPENDECTOMY  1965  . BREAST SURGERY Bilateral 1990   Reduction  . c setion    . CARPAL TUNNEL RELEASE Bilateral 2000  . CESAREAN SECTION  16812,7517 1969   x 3  . CYSTOSCOPY W/ RETROGRADES Right 11/02/2015   Procedure: CYSTOSCOPY WITH RETROGRADE PYELOGRAM, URETERAL STENT EXCHANGE;  Surgeon: BNickie Retort MD;  Location: [facility];  Service: Urology;  Laterality: Right;  . CYSTOSCOPY W/ RETROGRADES Right 01/31/2016   Procedure: CYSTOSCOPY WITH RETROGRADE PYELOGRAM;  Surgeon: PCleon Gustin MD;  Location: [facility];  Service: Urology;  Laterality: Right;  . CYSTOSCOPY W/ RETROGRADES Bilateral 05/09/2016   Procedure: CYSTOSCOPY WITH RETROGRADE PYELOGRAM;  Surgeon: BNickie Retort MD;  Location: [facility];  Service: Urology;  Laterality: Bilateral;  . CYSTOSCOPY W/ RETROGRADES Left 11/21/2016   Procedure: CYSTOSCOPY WITH RETROGRADE PYELOGRAM;  Surgeon: BNickie Retort MD;  Location: [facility];  Service: Urology;  Laterality: Left;  . CYSTOSCOPY W/ RETROGRADES Right 03/27/2017   Procedure: CYSTOSCOPY WITH RETROGRADE PYELOGRAM;  Surgeon: BNickie Retort  MD;  Location: [facility];  Service: Urology;  Laterality: Right;  . CYSTOSCOPY W/ RETROGRADES Bilateral 05/31/2017   Procedure: CYSTOSCOPY WITH RETROGRADE PYELOGRAM;  Surgeon: [name], MD;  Location: [facility];  Service: Urology;  Laterality: Bilateral;  . CYSTOSCOPY W/ RETROGRADES Right 07/22/2018   Procedure: CYSTOSCOPY WITH RETROGRADE PYELOGRAM;  Surgeon: Abbie Sons, MD;  Location: [facility];  Service: Urology;  Laterality: Right;  . CYSTOSCOPY W/ URETERAL STENT PLACEMENT Right 05/11/2015   Procedure: CYSTOSCOPY WITH STENT REPLACEMENT;  Surgeon: [name], MD;  Location: [facility];  Service: Urology;   Laterality: Right;  . CYSTOSCOPY W/ URETERAL STENT PLACEMENT Right 01/31/2016   Procedure: CYSTOSCOPY, RETROGRADE PYELOGRAMS WITH STENT REPLACEMENT;  Surgeon: [name], MD;  Location: [facility];  Service: Urology;  Laterality: Right;  . CYSTOSCOPY W/ URETERAL STENT PLACEMENT Right 05/09/2016   Procedure: CYSTOSCOPY WITH STENT REPLACEMENT;  Surgeon: [name], MD;  Location: [facility];  Service: Urology;  Laterality: Right;  . CYSTOSCOPY W/ URETERAL STENT PLACEMENT Bilateral 07/04/2016   Procedure: CYSTOSCOPY WITH STENT REPLACEMENT;  Surgeon: [name], MD;  Location: [facility];  Service: Urology;  Laterality: Bilateral;  . CYSTOSCOPY W/ URETERAL STENT PLACEMENT Right 09/14/2016   Procedure: CYSTOSCOPY WITH STENT REPLACEMENT;  Surgeon: [name], MD;  Location: [facility];  Service: Urology;  Laterality: Right;  . CYSTOSCOPY W/ URETERAL STENT PLACEMENT Right 11/21/2016   Procedure: CYSTOSCOPY WITH STENT REPLACEMENT;  Surgeon: [name], MD;  Location: [facility];  Service: Urology;  Laterality: Right;  . CYSTOSCOPY W/ URETERAL STENT PLACEMENT Right 01/25/2017   Procedure: CYSTOSCOPY WITH STENT REPLACEMENT;  Surgeon: [name], MD;  Location: [facility];  Service: Urology;  Laterality: Right;  . CYSTOSCOPY W/ URETERAL STENT PLACEMENT Right 03/27/2017   Procedure: CYSTOSCOPY WITH STENT REPLACEMENT;  Surgeon: [name], MD;  Location: [facility];  Service: Urology;  Laterality: Right;  . CYSTOSCOPY W/ URETERAL STENT PLACEMENT Right 05/31/2017   Procedure: CYSTOSCOPY WITH STENT REPLACEMENT;  Surgeon: [name], MD;  Location: [facility];  Service: Urology;  Laterality: Right;  . CYSTOSCOPY W/ URETERAL STENT PLACEMENT Right 08/02/2017   Procedure: CYSTOSCOPY WITH STENT REPLACEMENT;  Surgeon: [name], MD;  Location: [facility];  Service: Urology;  Laterality: Right;  . CYSTOSCOPY W/ URETERAL STENT PLACEMENT Right 10/29/2017   Procedure:  CYSTOSCOPY WITH STENT REPLACEMENT;  Surgeon: Abbie Sons, MD;  Location: [facility];  Service: Urology;  Laterality: Right;  . CYSTOSCOPY W/ URETERAL STENT PLACEMENT Right 02/14/2018   Procedure: CYSTOSCOPY WITH RETROGRADE PYELOGRAM/URETERAL STENT Exchange;  Surgeon: Abbie Sons, MD;  Location: [facility];  Service: Urology;  Laterality: Right;  . CYSTOSCOPY W/ URETERAL STENT REMOVAL Left 09/14/2016   Procedure: CYSTOSCOPY WITH STENT REMOVAL;  Surgeon: [name], MD;  Location: [facility];  Service: Urology;  Laterality: Left;  . CYSTOSCOPY WITH STENT PLACEMENT Left 05/09/2016   Procedure: CYSTOSCOPY WITH STENT PLACEMENT;  Surgeon: [name], MD;  Location: [facility];  Service: Urology;  Laterality: Left;  . CYSTOSCOPY WITH STENT PLACEMENT Right 07/22/2018   Procedure: CYSTOSCOPY WITH STENT Exchange;  Surgeon: Abbie Sons, MD;  Location: [facility];  Service: Urology;  Laterality: Right;  . EYE SURGERY Left 2018   tear duct reconstruction  . HERNIA REPAIR  7209   umbilical  . JOINT REPLACEMENT Left 2013   TKR  . JOINT REPLACEMENT Right 2011   TKR  . LAPAROSCOPIC HYSTERECTOMY    .  REDUCTION MAMMAPLASTY    . REPLACEMENT TOTAL KNEE Bilateral   . SKIN CANCER EXCISION Left 2018   lower eye lid  . URETEROSCOPY Bilateral 05/09/2016   Procedure: URETEROSCOPY;  Surgeon: [name], MD;  Location: [facility];  Service: Urology;  Laterality: Bilateral;  . URETEROSCOPY WITH HOLMIUM LASER LITHOTRIPSY Left 07/04/2016   Procedure: URETEROSCOPY WITH HOLMIUM LASER LITHOTRIPSY;  Surgeon: [name], MD;  Location: [facility];  Service: Urology;  Laterality: Left;    SOCIAL HISTORY:   Social History   Tobacco Use  . Smoking status: Never Smoker  . Smokeless tobacco: Never Used  Substance Use Topics  . Alcohol use: No    Alcohol/week: 0.0 standard drinks    FAMILY HISTORY:   Family History  Problem Relation Age of Onset  . Liver cancer Mother   . Colon cancer  Mother   . Breast cancer Mother 58  . Diabetes Daughter   . Kidney disease Daughter        adrenal tumors  . Breast cancer Maternal Aunt   . Kidney cancer Neg Hx   . Prostate cancer Neg Hx     DRUG ALLERGIES:   Allergies  Allergen Reactions  . Ibuprofen Itching, Nausea Only and Rash    REVIEW OF SYSTEMS:   ROS- unable to obtain due to patient being on NRB.  MEDICATIONS AT HOME:   Prior to Admission medications   Medication Sig Start Date End Date Taking? Authorizing Provider  albuterol (PROVENTIL HFA;VENTOLIN HFA) 108 (90 Base) MCG/ACT inhaler Inhale 2 puffs into the lungs every 4 (four) hours as needed for wheezing or shortness of breath.   Yes [provider]  alendronate (FOSAMAX) 70 MG tablet Take 70 mg by mouth every Friday.  02/16/15  Yes [provider]  aspirin EC 81 MG tablet Take 81 mg by mouth daily.    Yes [provider]  atorvastatin (LIPITOR) 40 MG tablet Take 40 mg by mouth daily.    Yes [provider]  buPROPion (WELLBUTRIN XL) 300 MG 24 hr tablet Take 1 tablet (300 mg total) by mouth daily. 09/22/18  Yes Cameron Sprang, MD  furosemide (LASIX) 20 MG tablet Take 20 mg by mouth daily.    Yes [provider]  gabapentin (NEURONTIN) 100 MG capsule Take 100 mg by mouth 2 (two) times daily.  01/29/18  Yes [provider]  insulin lispro (HUMALOG) 100 UNIT/ML KiwkPen Inject 10 Units into the skin 3 (three) times daily before meals.  08/13/14  Yes [provider]  LANTUS SOLOSTAR 100 UNIT/ML Solostar Pen Inject 35 Units into the skin at bedtime.  03/31/15  Yes [provider]  levothyroxine (SYNTHROID, LEVOTHROID) 75 MCG tablet Take 75 mcg by mouth daily before breakfast.   Yes [provider]  OXYGEN Inhale 2 L into the lungs at bedtime.   Yes [provider]  pantoprazole (PROTONIX) 40 MG tablet Take 40 mg by mouth daily.  08/26/14  Yes [provider]  QUEtiapine (SEROQUEL)  25 MG tablet Take 1 tablet (25 mg total) by mouth at bedtime. 09/22/18  Yes Cameron Sprang, MD  telmisartan (MICARDIS) 40 MG tablet Take 40 mg by mouth daily.  04/20/15  Yes [provider]  umeclidinium-vilanterol (ANORO ELLIPTA) 62.5-25 MCG/INH AEPB Inhale 1 puff into the lungs daily.   Yes [provider]  vitamin B-12 (CYANOCOBALAMIN) 1000 MCG tablet Take 1,000 mcg by mouth daily.   Yes [provider]  amoxicillin (  AMOXIL) 875 MG tablet Take 1 tablet (875 mg total) by mouth every 12 (twelve) hours. Patient not taking: Reported on 09/22/2018 07/17/18   Abbie Sons, MD  HYDROcodone-acetaminophen (NORCO/VICODIN) 5-325 MG tablet Take 1 tablet by mouth every 6 (six) hours as needed for moderate pain. Patient not taking: Reported on 10/06/2018 07/22/18   Abbie Sons, MD  metFORMIN (GLUCOPHAGE) 1000 MG tablet Take 1,000 mg by mouth 2 (two) times daily with a meal.    [provider]      VITAL SIGNS:  Blood pressure (!) 146/72, pulse (!) 101, temperature (!) 97.3 F (36.3 C), resp. rate 20, SpO2 92 %.  PHYSICAL EXAMINATION:  Physical Exam   GENERAL:  73 y.o.-year-old patient lying in the bed with no acute distress.  EYES: Pupils equal, round, reactive to light and accommodation. No scleral icterus. Extraocular muscles intact.  HEENT: Head atraumatic, normocephalic. Oropharynx and nasopharynx clear.  NECK:  Supple, no jugular venous distention. No thyroid enlargement, no tenderness.  LUNGS: + Diffuse inspiratory crackles.  No wheezing.  No use of accessory muscles of respiration.  Nasal cannula in place CARDIOVASCULAR: RRR, S1, S2 normal. No murmurs, rubs, or gallops.  ABDOMEN: Soft, nontender, nondistended. Bowel sounds present. No organomegaly or mass.  EXTREMITIES: No pedal edema, cyanosis, or clubbing.  NEUROLOGIC: Cranial nerves II through XII are intact. Muscle strength 5/5 in all extremities. Sensation intact. Gait not checked.  PSYCHIATRIC:  The patient is alert and oriented x 3.  SKIN: No obvious rash, lesion, or ulcer.   LABORATORY PANEL:   CBC Recent Labs  Lab 10/07/18 1447  WBC 11.6*  HGB 10.5*  HCT 36.2  PLT 333   ------------------------------------------------------------------------------------------------------------------  Chemistries  Recent Labs  Lab 10/07/18 1447  NA 141  K 3.8  CL 109  CO2 23  GLUCOSE 150*  BUN 16  CREATININE 1.00  CALCIUM 8.7*   ------------------------------------------------------------------------------------------------------------------  Cardiac Enzymes No results for input(s): TROPONINI in the last 168 hours. ------------------------------------------------------------------------------------------------------------------  RADIOLOGY:  X-ray Chest Pa Or Ap  Result Date: 10/10/2018 CLINICAL DATA:  Hypoxia, status post bronchoscopy EXAM: CHEST  1 VIEW COMPARISON:  PET CT 10/01/2018, CT chest 09/19/2018 FINDINGS: Chronic interstitial lung disease. Slight increased mid to basilar interstitial and basilar alveolar disease. Cardiomegaly. No pneumothorax. IMPRESSION: 1. Chronic interstitial lung disease with suspected acute superimposed interstitial and alveolar disease at the bases. 2. Cardiomegaly Electronically Signed   By: Donavan Foil M.D.   On: 10/10/2018 17:23      IMPRESSION AND PLAN:   Acute on chronic hypoxic respiratory failure- s/p bronchoscopy this afternoon. CXR showing atelectasis. Requiring NRB, but able to be transitioned to 3L O2 by [place] (uses 2L O2 at home). Will wean O2 as able.   RLL mass with hilar lymphadenopathy- needs to f/u with pulmonology and oncology as an outpatient. Biopsies pending.  COPD/ILD- no wheezing on exam. Will hold off on steroids at this time. Can always start if needed. Continue home inhalers. Duonebs prn.   Type 2 diabetes- Lantus 15 units qhs and SSI  Hyperlipidemia- continue home lipitor  Hypothyroidism- continue home  synthroid  Depression/anxiety- continue wellbutrin and seroquel  All the records are reviewed and case discussed with ED provider. Management plans discussed with the patient, family and they are in agreement.  CODE STATUS: Full  TOTAL TIME TAKING CARE OF THIS PATIENT: 45 minutes.    Berna Spare  M.D on 10/10/2018 at 7:36 PM  Between 7am to 6pm - Pager - [phone]  After 6pm go to [email or web address] - Technical brewer [facility]  Office  [phone]  CC: Primary care physician; [name], MD   Note: This dictation was prepared with Dragon dictation along with smaller phrase technology. Any transcriptional errors that result from this process are unintentional.

## 2018-10-10 NOTE — Procedures (Signed)
FLEXIBLE BRONCHOSCOPY AND ENDOBRONCHIAL ULTRASOUND PROCEDURE NOTE    Flexible bronchoscopy was performed on 10/10/18 by : Lanney Gins MD  assistance by : 1)Stacey RT and 2)   Indication for the procedure was :  Pre-procedural H&P. The following assessment was performed on the day of the procedure prior to initiating sedation History:  Chest pain n Dyspnea n Hemoptysis n Cough n Fever n Other pertinent items n  Examination Vital signs -reviewed as per nursing documentation today Cardiac    Murmurs: n  Rubs : n  Gallop: n Lungs Wheezing: n Rales : y Rhonchi {:y  Other pertinent findings: n   Pre-procedural assessment for Procedural Sedation included: Depth of sedation: As per anesthesia team  ASA Classification:  2 Mallampati airway assessment: 3    Medication list reviewed: y  The patient's interval history was taken and revealed: no new complaints The pre- procedure physical examination revealed: No new findings Refer to prior clinic note for details.  Informed Consent: Informed consent was obtained from:  patient after explanation of procedure and risks, benefits, as well as alternative procedures available.  Explanation of level of sedation and possible transfusion was also provided.    Procedural Preparation: Time out was performed and patient was identified by name and birthdate and procedure to be performed and side for sampling, if any, was specified. Pt was intubated by anesthesia.  The patient was appropriately draped.  Procedure Findings: Bronchoscope was inserted via ETT  without difficulty.  Posterior oropharynx, epiglottis, arytenoids, false cords and vocal cords were not visualized as these were bypassed by endotracheal tube.   The distal trachea was ** in circumference and appearance with mucosal, cartilaginous or branching abnormalities.  The main carina was non splayed .   All right and left lobar airways were visualized to the Subsegmental  level.  Sub- sub segmental carinae were identified in all the distal airways.   Secretions were visible in the following airways and appeared to be normal.  The mucosa was : very friable, please see imaging  Airways were notable for:        exophytic lesions :n       extrinsic compression in the following distributions: n.       Friable mucosa: y       Neurosurgeon /pigmentation: n   Pictorial documentation attached: yes     -------------------------------------------------------------------- The fiberoptic bronchoscope was removed and the EBUS scope was introduced. Examination began to evaluate for pathologically enlarged lymph nodes starting on the L side progressing to the R in the following sequence: 11L>10L>4L>7>4R. There was a 1.4 cm node at 11L which was biopsied, 1.2 at 10R and 1.8 cm at 4R. All lymph node biopsies performed with 21g needle. Lymph node biopsies were sent in cytolite for all stations. All stations were evaluated by ROSE and noted lymphoid shedding but no atypical cells.  The bronchoscopy was terminated due to completion of the planned procedure and the bronchoscope was removed.   Total dosage of Lidocaine was 23m Total fluoroscopy time was 0 minutes   Estimated Blood loss: 10cc.  Complications included:  none immediate  Preliminary CXR findings :  na  Disposition: home  Follow up with Dr. FVella Kohleror ALanney Gins in 10 days for result discussion.   FClaudette StaplerMD  [facility]Columb[facility] ValleyDivision of Pulmonary [facility] Care Medicine

## 2018-10-10 NOTE — H&P (Signed)
Pulmonary Medicine          Date: 10/10/2018,   MRN# [id number] Lori Blanchard Dec 20, 1945     Admission                  Current       CHIEF COMPLAINT:   Dyspnea, lung fibrosis   HISTORY OF PRESENT ILLNESS    This is a pleasant 73 yo female with complicated PMH, seen by pulmonary Dr Vella Kohler for chronic lung disease including pneumonitis, ILD, fibrosis, RLL lesion suspicious for poss lung CA, and hilar LAD.  I was asked by her pulmonologist to perform EBUS to evaluate enlarged hilar lymph nodes via biopsy. This was discussed with patient and multiple family members.  All question regarding procedure were answered and risk explained in detail and patient as well as family wish to have procedure done.     PAST MEDICAL HISTORY   Past Medical History:  Diagnosis Date  . Alzheimer disease ([place])   . Anxiety   . Anxiety and depression   . Asthma   . Cancer ([place])    Basal Cell Skin Cancer  . CHF (congestive heart failure) ([place])    patient denies this diagnosis  . COPD (chronic obstructive pulmonary disease) ([place])   . Depression   . Diabetes ([place])   . Diverticulosis    patient denies problems for this  . Dyspnea   . Elevated lipids   . Fatty liver   . GERD (gastroesophageal reflux disease)   . Gout    no medication at this time  . History of kidney stones   . Hydronephrosis    stage III ckd  . Hyperlipemia   . Hypertension   . Hypothyroidism   . Lower extremity edema    comes and goes  . Osteoarthritis   . Osteoarthritis   . Osteoporosis   . Pulmonary nodule    has been followed for years, not cancerous. causes breathing issues  . Retroperitoneal fibrosis   . Sleep apnea    CPAP  . Ureter injury   . Ureteropelvic junction (UPJ) obstruction    requires change of ureteral stent approximately every 2 to 3 months     SURGICAL HISTORY   Past Surgical History:  Procedure Laterality Date  . ABDOMINAL HYSTERECTOMY     partial  . ANKLE FRACTURE  SURGERY Left    pins in ankle but not in correct location  . APPENDECTOMY  1965  . BREAST SURGERY Bilateral 1990   Reduction  . c setion    . CARPAL TUNNEL RELEASE Bilateral 2000  . CESAREAN SECTION  1017,5102, 1969   x 3  . CYSTOSCOPY W/ RETROGRADES Right 11/02/2015   Procedure: CYSTOSCOPY WITH RETROGRADE PYELOGRAM, URETERAL STENT EXCHANGE;  Surgeon: [name], MD;  Location: [facility];  Service: Urology;  Laterality: Right;  . CYSTOSCOPY W/ RETROGRADES Right 01/31/2016   Procedure: CYSTOSCOPY WITH RETROGRADE PYELOGRAM;  Surgeon: [name], MD;  Location: [facility];  Service: Urology;  Laterality: Right;  . CYSTOSCOPY W/ RETROGRADES Bilateral 05/09/2016   Procedure: CYSTOSCOPY WITH RETROGRADE PYELOGRAM;  Surgeon: [name], MD;  Location: [facility];  Service: Urology;  Laterality: Bilateral;  . CYSTOSCOPY W/ RETROGRADES Left 11/21/2016   Procedure: CYSTOSCOPY WITH RETROGRADE PYELOGRAM;  Surgeon: [name], MD;  Location: [facility];  Service: Urology;  Laterality: Left;  . CYSTOSCOPY W/ RETROGRADES Right 03/27/2017   Procedure: CYSTOSCOPY WITH RETROGRADE PYELOGRAM;  Surgeon: Baruch Gouty  Jeneen Rinks, MD;  Location: [facility];  Service: Urology;  Laterality: Right;  . CYSTOSCOPY W/ RETROGRADES Bilateral 05/31/2017   Procedure: CYSTOSCOPY WITH RETROGRADE PYELOGRAM;  Surgeon: [name], MD;  Location: [facility];  Service: Urology;  Laterality: Bilateral;  . CYSTOSCOPY W/ RETROGRADES Right 07/22/2018   Procedure: CYSTOSCOPY WITH RETROGRADE PYELOGRAM;  Surgeon: Abbie Sons, MD;  Location: [facility];  Service: Urology;  Laterality: Right;  . CYSTOSCOPY W/ URETERAL STENT PLACEMENT Right 05/11/2015   Procedure: CYSTOSCOPY WITH STENT REPLACEMENT;  Surgeon: [name], MD;  Location: [facility];  Service: Urology;  Laterality: Right;  . CYSTOSCOPY W/ URETERAL STENT PLACEMENT Right 01/31/2016   Procedure: CYSTOSCOPY, RETROGRADE PYELOGRAMS WITH STENT REPLACEMENT;   Surgeon: [name], MD;  Location: [facility];  Service: Urology;  Laterality: Right;  . CYSTOSCOPY W/ URETERAL STENT PLACEMENT Right 05/09/2016   Procedure: CYSTOSCOPY WITH STENT REPLACEMENT;  Surgeon: [name], MD;  Location: [facility];  Service: Urology;  Laterality: Right;  . CYSTOSCOPY W/ URETERAL STENT PLACEMENT Bilateral 07/04/2016   Procedure: CYSTOSCOPY WITH STENT REPLACEMENT;  Surgeon: [name], MD;  Location: [facility];  Service: Urology;  Laterality: Bilateral;  . CYSTOSCOPY W/ URETERAL STENT PLACEMENT Right 09/14/2016   Procedure: CYSTOSCOPY WITH STENT REPLACEMENT;  Surgeon: [name], MD;  Location: [facility];  Service: Urology;  Laterality: Right;  . CYSTOSCOPY W/ URETERAL STENT PLACEMENT Right 11/21/2016   Procedure: CYSTOSCOPY WITH STENT REPLACEMENT;  Surgeon: [name], MD;  Location: [facility];  Service: Urology;  Laterality: Right;  . CYSTOSCOPY W/ URETERAL STENT PLACEMENT Right 01/25/2017   Procedure: CYSTOSCOPY WITH STENT REPLACEMENT;  Surgeon: [name], MD;  Location: [facility];  Service: Urology;  Laterality: Right;  . CYSTOSCOPY W/ URETERAL STENT PLACEMENT Right 03/27/2017   Procedure: CYSTOSCOPY WITH STENT REPLACEMENT;  Surgeon: [name], MD;  Location: [facility];  Service: Urology;  Laterality: Right;  . CYSTOSCOPY W/ URETERAL STENT PLACEMENT Right 05/31/2017   Procedure: CYSTOSCOPY WITH STENT REPLACEMENT;  Surgeon: [name], MD;  Location: [facility];  Service: Urology;  Laterality: Right;  . CYSTOSCOPY W/ URETERAL STENT PLACEMENT Right 08/02/2017   Procedure: CYSTOSCOPY WITH STENT REPLACEMENT;  Surgeon: [name], MD;  Location: [facility];  Service: Urology;  Laterality: Right;  . CYSTOSCOPY W/ URETERAL STENT PLACEMENT Right 10/29/2017   Procedure: CYSTOSCOPY WITH STENT REPLACEMENT;  Surgeon: Abbie Sons, MD;  Location: [facility];  Service: Urology;  Laterality: Right;  . CYSTOSCOPY W/ URETERAL  STENT PLACEMENT Right 02/14/2018   Procedure: CYSTOSCOPY WITH RETROGRADE PYELOGRAM/URETERAL STENT Exchange;  Surgeon: Abbie Sons, MD;  Location: [facility];  Service: Urology;  Laterality: Right;  . CYSTOSCOPY W/ URETERAL STENT REMOVAL Left 09/14/2016   Procedure: CYSTOSCOPY WITH STENT REMOVAL;  Surgeon: [name], MD;  Location: [facility];  Service: Urology;  Laterality: Left;  . CYSTOSCOPY WITH STENT PLACEMENT Left 05/09/2016   Procedure: CYSTOSCOPY WITH STENT PLACEMENT;  Surgeon: [name], MD;  Location: [facility];  Service: Urology;  Laterality: Left;  . CYSTOSCOPY WITH STENT PLACEMENT Right 07/22/2018   Procedure: CYSTOSCOPY WITH STENT Exchange;  Surgeon: Abbie Sons, MD;  Location: [facility];  Service: Urology;  Laterality: Right;  . EYE SURGERY Left 2018   tear duct reconstruction  . HERNIA REPAIR  0626   umbilical  . JOINT REPLACEMENT Left 2013   TKR  . JOINT REPLACEMENT Right 2011   TKR  . LAPAROSCOPIC HYSTERECTOMY    .  REDUCTION MAMMAPLASTY    . REPLACEMENT TOTAL KNEE Bilateral   . SKIN CANCER EXCISION Left 2018   lower eye lid  . URETEROSCOPY Bilateral 05/09/2016   Procedure: URETEROSCOPY;  Surgeon: [name], MD;  Location: [facility];  Service: Urology;  Laterality: Bilateral;  . URETEROSCOPY WITH HOLMIUM LASER LITHOTRIPSY Left 07/04/2016   Procedure: URETEROSCOPY WITH HOLMIUM LASER LITHOTRIPSY;  Surgeon: [name], MD;  Location: [facility];  Service: Urology;  Laterality: Left;     FAMILY HISTORY   Family History  Problem Relation Age of Onset  . Liver cancer Mother   . Colon cancer Mother   . Breast cancer Mother 5  . Diabetes Daughter   . Kidney disease Daughter        adrenal tumors  . Breast cancer Maternal Aunt   . Kidney cancer Neg Hx   . Prostate cancer Neg Hx      SOCIAL HISTORY   Social History   Tobacco Use  . Smoking status: Never Smoker  . Smokeless tobacco: Never Used  Substance Use Topics  . Alcohol  use: No    Alcohol/week: 0.0 standard drinks  . Drug use: No     MEDICATIONS    Home Medication:    Current Medication:  Current Facility-Administered Medications:  .  0.9 %  sodium chloride infusion, , Intravenous, Continuous, Piscitello, Precious Haws, MD, Last Rate: 50 mL/hr at 10/10/18 1221    ALLERGIES   Ibuprofen     REVIEW OF SYSTEMS    Review of Systems:  Gen:  Denies  fever, sweats, chills weigh loss  HEENT: Denies blurred vision, double vision, ear pain, eye pain, hearing loss, nose bleeds, sore throat Cardiac:  No dizziness, chest pain or heaviness, chest tightness,edema Resp:   Denies cough or sputum porduction, shortness of breath,wheezing, hemoptysis,  Gi: Denies swallowing difficulty, stomach pain, nausea or vomiting, diarrhea, constipation, bowel incontinence Gu:  Denies bladder incontinence, burning urine Ext:   Denies Joint pain, stiffness or swelling Skin: Denies  skin rash, easy bruising or bleeding or hives Endoc:  Denies polyuria, polydipsia , polyphagia or weight change Psych:   Denies depression, insomnia or hallucinations   Other:  All other systems negative   VS: BP (!) 168/86   Pulse 97   Temp 97.6 F (36.4 C) (Oral)   Resp 18   SpO2 95%      PHYSICAL EXAM    GENERAL:NAD, no fevers, chills, no weakness no fatigue HEAD: Normocephalic, atraumatic.  EYES: Pupils equal, round, reactive to light. Extraocular muscles intact. No scleral icterus.  MOUTH: Moist mucosal membrane. Dentition intact. No abscess noted.  EAR, NOSE, THROAT: Clear without exudates. No external lesions.  NECK: Supple. No thyromegaly. No nodules. No JVD.  PULMONARY: Diffuse mild rhonchi bilaterally CARDIOVASCULAR: S1 and S2. Regular rate and rhythm. No murmurs, rubs, or gallops. No edema. Pedal pulses 2+ bilaterally.  GASTROINTESTINAL: Soft, nontender, nondistended. No masses. Positive bowel sounds. No hepatosplenomegaly.  MUSCULOSKELETAL: No swelling, clubbing, or  edema. Range of motion full in all extremities.  NEUROLOGIC: Cranial nerves II through XII are intact. No gross focal neurological deficits. Sensation intact. Reflexes intact.  SKIN: No ulceration, lesions, rashes, or cyanosis. Skin warm and dry. Turgor intact.  PSYCHIATRIC: Mood, affect within normal limits. The patient is awake, alert and oriented x 3. Insight, judgment intact.       IMAGING    Ct Chest Wo Contrast  Result Date: 09/19/2018 CLINICAL DATA:  Follow-up interstitial lung disease.  Dyspnea. EXAM: CT CHEST WITHOUT CONTRAST TECHNIQUE: Multidetector CT imaging of the chest was performed following the standard protocol without IV contrast. COMPARISON:  08/07/2017 chest CT. FINDINGS: Cardiovascular: Normal heart size. No significant pericardial effusion/thickening. Left anterior descending coronary atherosclerosis. Atherosclerotic nonaneurysmal thoracic aorta. Stable top-normal main pulmonary artery (3.1 cm diameter). Mediastinum/Nodes: No discrete thyroid nodules. Unremarkable esophagus. No axillary adenopathy. Enlarged 1.5 cm right paratracheal node (series 2/image 57), previously 1.4 cm using similar measurement technique, not appreciably changed. Stable mild AP window adenopathy up to 1.1 cm (series 2/image 68). Qualitative mild bilateral hilar adenopathy, poorly delineated on these noncontrast CT images, not appreciably changed. Lungs/Pleura: No pneumothorax. No pleural effusion. There is a mosaic attenuation throughout both lungs, unchanged. There is patchy confluent peribronchovascular and subpleural reticulation and ground-glass attenuation in both lungs with associated moderate traction bronchiectasis and architectural distortion. There is a basilar predominance to these findings. No frank honeycombing. Findings have not convincingly progressed back to 10/22/2016 chest CT. There is a new irregular subsolid pulmonary nodule in the peripheral right lower lobe measuring 2.8 x 1.8 cm with  1.1 cm solid component (series 3/image 93). Previously visualized subpleural solid right middle lobe pulmonary nodules, largest 7 mm (series 3/image 88), not appreciably changed, presumably benign. No additional new significant pulmonary nodules. Upper abdomen: No acute abnormality. Musculoskeletal: No aggressive appearing focal osseous lesions. Stable healed deformities in the lateral right eighth and ninth ribs. Marked thoracic spondylosis. IMPRESSION: 1. New indeterminate irregular subsolid peripheral right lower lobe 2.8 x 1.8 cm pulmonary nodule with 1.1 cm solid component. Differential includes primary bronchogenic neoplasm versus focal infection. Management options include short-term follow-up chest CT in 3 months versus PET-CT depending on level of clinical suspicion for neoplasm. 2. Basilar predominant fibrotic interstitial lung disease without frank honeycombing. No convincing progression since 2018 chest CT. Presence of mosaic attenuation in the lungs is not typical of UIP. 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 Findings are suggestive of an alternative diagnosis (not UIP) per consensus guidelines: Diagnosis of Idiopathic Pulmonary Fibrosis: An Official ATS/ERS/JRS/ALAT Clinical Practice Guideline. [place], Iss 5, [phone], Apr 06 2017. 3. Chronic mild mediastinal and bilateral hilar lymphadenopathy, nonspecific, not appreciably changed. 4. One vessel coronary atherosclerosis. Aortic Atherosclerosis (ICD10-I70.0). These results will be called to the ordering clinician or representative by the Radiologist Assistant, and communication documented in the PACS or zVision Dashboard. Electronically Signed   By: Ilona Sorrel M.D.   On: 09/19/2018 13:32   Nm Pet Image Initial (pi) Skull Base To Thigh  Result Date: 10/01/2018 CLINICAL DATA:  Initial treatment strategy for pulmonary nodule. EXAM: NUCLEAR MEDICINE PET SKULL BASE TO THIGH TECHNIQUE: 8.8 mCi  F-18 FDG was injected intravenously. Full-ring PET imaging was performed from the skull base to thigh after the radiotracer. CT data was obtained and used for attenuation correction and anatomic localization. Fasting blood glucose: 120 mg/dl COMPARISON:  CT 09/19/2018, 08/07/2017 FINDINGS: Mediastinal blood pool activity: SUV max 2.76 NECK: No hypermetabolic lymph nodes in the neck. Incidental CT findings: none CHEST: Within the RIGHT lower lobe irregular nodule of concern measures 16 mm (image 100/3) and does have mild associated metabolic activity SUV max equal 4.0. Lesion not changed appreciably in size or imaging characteristics from CT 09/19/2018. No additional hypermetabolic nodules. There is extensive interstitial lung disease not changed from prior. Smaller nodule in the RIGHT middle lobe measuring 6 mm is not changed from more remote comparison CTs. No hypermetabolic mediastinal lymph nodes.  To head Incidental CT findings: none ABDOMEN/PELVIS: No abnormal hypermetabolic activity within the liver, pancreas, adrenal glands, or spleen. No hypermetabolic lymph nodes in the abdomen or pelvis. Physiologic activity throughout the colon. Incidental CT findings: Double-J ureteral stent on the RIGHT. SKELETON: No focal hypermetabolic activity to suggest skeletal metastasis. Incidental CT findings: none IMPRESSION: 1. Hypermetabolic RIGHT lower lobe pulmonary nodule with irregular margins. Differential includes neoplasm versus focus of infection. Recommend follow-up CT in 4 to 6 weeks. If lesion persists, recommend percutaneous biopsy. 2. Second nodule in the RIGHT middle lobe without metabolic activity is stable. 3. Interstitial lung disease again noted. 4. No evidence of metastatic adenopathy or distant metastatic disease Electronically Signed   By: Suzy Bouchard M.D.   On: 10/01/2018 17:45      ASSESSMENT/PLAN   Right lower lobe lesion suspicious for lung cancer with hilar lymphadenopathy as shown on  imaging above.      - plan for airway inspection with flexible bronchoscopy and EBUS for lymph node biopsy and staging.      Thank you for allowing me to participate in the care of this patient.   Patient/Family are satisfied with care plan and all questions have been answered.  This document was prepared using Dragon voice recognition software and may include unintentional dictation errors.     Ottie Glazier, M.D.  Division of [place]

## 2018-10-10 NOTE — OR Nursing (Signed)
Advised Dr. Brett Albino that patient is going to room 208.

## 2018-10-10 NOTE — Anesthesia Post-op Follow-up Note (Signed)
Anesthesia QCDR form completed.        

## 2018-10-10 NOTE — Progress Notes (Signed)
Spoke with Dr Rosey Bath after giving Duoneb tx, with O2 at 90-92% on 3L [place]. Instructed to call doctor that performed bronchoscopy. Paged Dr Lanney Gins at [phone] regarding patients status of low O2 Saturations. Received verbal order for lasix 20mg  IV and chest x-ray. Patient used bed side commode with 2L [place] and O2 dropped to 65. Patient placed on NRB. Since patient was placed on NRB O2 has been 100%. Patient has occasional non-productive cough.

## 2018-10-10 NOTE — Progress Notes (Signed)
Family Meeting Note  Advance Directive:yes  Today a meeting took place with the Patient and daughter Lori Blanchard).  Patient is able to participate.  The following clinical team members were present during this meeting:MD  The following were discussed:Patient's diagnosis: ILD, Patient's progosis: Unable to determine and Goals for treatment: Full Code  Additional follow-up to be provided: prn  Time spent during discussion:20 minutes  Evette Doffing, MD

## 2018-10-11 DIAGNOSIS — R0600 Dyspnea, unspecified: Secondary | ICD-10-CM | POA: Diagnosis not present

## 2018-10-11 DIAGNOSIS — G309 Alzheimer's disease, unspecified: Secondary | ICD-10-CM | POA: Diagnosis not present

## 2018-10-11 LAB — GLUCOSE, CAPILLARY
Glucose-Capillary: 179 mg/dL — ABNORMAL HIGH (ref 70–99)
Glucose-Capillary: 201 mg/dL — ABNORMAL HIGH (ref 70–99)

## 2018-10-11 LAB — CBC
HCT: 33.4 % — ABNORMAL LOW (ref 36.0–46.0)
Hemoglobin: 9.8 g/dL — ABNORMAL LOW (ref 12.0–15.0)
MCH: 22 pg — ABNORMAL LOW (ref 26.0–34.0)
MCHC: 29.3 g/dL — ABNORMAL LOW (ref 30.0–36.0)
MCV: 74.9 fL — ABNORMAL LOW (ref 80.0–100.0)
Platelets: 333 10*3/uL (ref 150–400)
RBC: 4.46 MIL/uL (ref 3.87–5.11)
RDW: 16 % — ABNORMAL HIGH (ref 11.5–15.5)
WBC: 15.1 10*3/uL — ABNORMAL HIGH (ref 4.0–10.5)
nRBC: 0 % (ref 0.0–0.2)

## 2018-10-11 LAB — BASIC METABOLIC PANEL
ANION GAP: 12 (ref 5–15)
BUN: 19 mg/dL (ref 8–23)
CALCIUM: 8.2 mg/dL — AB (ref 8.9–10.3)
CO2: 21 mmol/L — ABNORMAL LOW (ref 22–32)
Chloride: 105 mmol/L (ref 98–111)
Creatinine, Ser: 0.98 mg/dL (ref 0.44–1.00)
GFR calc Af Amer: >60 mL/min (ref >60)
GFR, EST NON AFRICAN AMERICAN: 58 mL/min — AB (ref >60)
Glucose, Bld: 231 mg/dL — ABNORMAL HIGH (ref 70–99)
Potassium: 4 mmol/L (ref 3.5–5.1)
Sodium: 138 mmol/L (ref 135–145)

## 2018-10-11 MED ORDER — CLOBETASOL PROPIONATE 0.05 % EX CREA: TOPICAL_CREAM | Freq: Two times a day (BID) | CUTANEOUS | 0 refills | Status: DC

## 2018-10-11 MED ORDER — CLOBETASOL PROPIONATE 0.05 % EX CREA
TOPICAL_CREAM | Freq: Two times a day (BID) | CUTANEOUS | Status: DC
  Administered 2018-10-11: 10:00:00 via TOPICAL
  Filled 2018-10-11: qty 15

## 2018-10-11 MED ORDER — CEFAZOLIN SODIUM-DEXTROSE 1-4 GM/50ML-% IV SOLN
1.0000 g | Freq: Once | INTRAVENOUS | Status: AC
  Administered 2018-10-11: 1 g via INTRAVENOUS
  Filled 2018-10-11: qty 50

## 2018-10-11 MED ORDER — CEPHALEXIN 500 MG PO CAPS: 500.0000 mg | ORAL_CAPSULE | Freq: Three times a day (TID) | ORAL | 0 refills | Status: AC

## 2018-10-11 MED ORDER — PREDNISONE 10 MG PO TABS: ORAL_TABLET | ORAL | 0 refills | Status: DC

## 2018-10-11 MED ORDER — IPRATROPIUM-ALBUTEROL 0.5-2.5 (3) MG/3ML IN SOLN: 3.0000 mL | Freq: Four times a day (QID) | RESPIRATORY_TRACT | Status: DC | PRN

## 2018-10-11 MED ORDER — METHYLPREDNISOLONE SODIUM SUCC 40 MG IJ SOLR
40.0000 mg | Freq: Every day | INTRAMUSCULAR | Status: DC
  Administered 2018-10-11: 40 mg via INTRAVENOUS
  Filled 2018-10-11: qty 1

## 2018-10-11 NOTE — Care Management Obs Status (Signed)
[facility] NOTIFICATION   Patient Details  Name: Lori Blanchard MRN: [id number] Date of Birth: 1945/08/26   Medicare Observation Status Notification Given:  Yes    Anureet Bruington A Lynne Righi, RN 10/11/2018, 10:39 AM

## 2018-10-11 NOTE — Progress Notes (Signed)
SATURATION QUALIFICATIONS: (This note is used to comply with regulatory documentation for home oxygen)  Patient Saturations on Room Air at Rest =  87%  Patient Saturations on Room Air while Ambulating = 84%  Patient Saturations on 3L Liters of oxygen while Ambulating = 92%  Please briefly explain why patient needs home oxygen:

## 2018-10-11 NOTE — Discharge Summary (Signed)
[facility] NAME: Lori Blanchard    MR#:  [id number]  DATE OF BIRTH:  May 18, 1946  DATE OF ADMISSION:  10/10/2018 ADMITTING PHYSICIAN: Sela Hua, MD  DATE OF DISCHARGE: 10/11/2018  1:42 PM  PRIMARY CARE PHYSICIAN: [name], MD    ADMISSION DIAGNOSIS:  R91.8 LUNG MASS  DISCHARGE DIAGNOSIS:  Active Problems:   Respiratory difficulty   Acute on chronic respiratory failure with hypoxia ([place])   SECONDARY DIAGNOSIS:   Past Medical History:  Diagnosis Date  . Alzheimer disease ([place])   . Anxiety   . Anxiety and depression   . Asthma   . Cancer ([place])    Basal Cell Skin Cancer  . CHF (congestive heart failure) ([place])    patient denies this diagnosis  . COPD (chronic obstructive pulmonary disease) ([place])   . Depression   . Diabetes ([place])   . Diverticulosis    patient denies problems for this  . Dyspnea   . Elevated lipids   . Fatty liver   . GERD (gastroesophageal reflux disease)   . Gout    no medication at this time  . History of kidney stones   . Hydronephrosis    stage III ckd  . Hyperlipemia   . Hypertension   . Hypothyroidism   . Lower extremity edema    comes and goes  . Osteoarthritis   . Osteoarthritis   . Osteoporosis   . Pulmonary nodule    has been followed for years, not cancerous. causes breathing issues  . Retroperitoneal fibrosis   . Sleep apnea    CPAP  . Ureter injury   . Ureteropelvic junction (UPJ) obstruction    requires change of ureteral stent approximately every 2 to 3 months    HOSPITAL COURSE:   1.  Acute on chronic hypoxic respiratory failure after bronchoscopy yesterday.  Patient and family states that she only wears oxygen at night with her CPAP machine.  Initially she required nonrebreather after bronchoscopy.  Patient on 3 L of oxygen.  Family had bought an Inogen oxygen machine.  She was advised to wear the oxygen 24/7.  Pulse ox on room air dropped to 84% with  ambulation. 2.  Right lower lobe mass with hilar lymphadenopathy.  Follow-up with pulmonology as outpatient for biopsy report. 3.  COPD with interstitial lung disease.  Some rhonchi at the bases.  Did start a dose of steroid and will do a quick taper. 4.  Rash right hand and arm.  Looks more like psoriasis but family concerned about infection.  Did give 1 dose of Ancef here and prescribed Keflex upon going home steroids would also help.  Steroid cream Temovate prescribed. 5.  Type 2 diabetes mellitus on Lantus and short acting insulin. 6.  Hyperlipidemia unspecified on Lipitor 7.  Hypothyroidism unspecified on Synthroid 8.  Depression anxiety continue Wellbutrin and Seroquel. 9.  Sleep apnea continue to wear oxygen 2 L with her CPAP at night  DISCHARGE CONDITIONS:   Satisfactory  CONSULTS OBTAINED:  None  DRUG ALLERGIES:   Allergies  Allergen Reactions  . Ibuprofen Itching, Nausea Only and Rash    DISCHARGE MEDICATIONS:   Allergies as of 10/11/2018      Reactions   Ibuprofen Itching, Nausea Only, Rash      Medication List    STOP taking these medications   amoxicillin 875 MG tablet Commonly known as:  AMOXIL   HYDROcodone-acetaminophen 5-325 MG tablet Commonly known  as:  NORCO/VICODIN   OXYGEN     TAKE these medications   albuterol 108 (90 Base) MCG/ACT inhaler Commonly known as:  PROVENTIL HFA;VENTOLIN HFA Inhale 2 puffs into the lungs every 4 (four) hours as needed for wheezing or shortness of breath.   alendronate 70 MG tablet Commonly known as:  FOSAMAX Take 70 mg by mouth every Friday.   Anoro Ellipta 62.5-25 MCG/INH Aepb Generic drug:  umeclidinium-vilanterol Inhale 1 puff into the lungs daily.   aspirin EC 81 MG tablet Take 81 mg by mouth daily.   atorvastatin 40 MG tablet Commonly known as:  LIPITOR Take 40 mg by mouth daily.   buPROPion 300 MG 24 hr tablet Commonly known as:  Wellbutrin XL Take 1 tablet (300 mg total) by mouth daily.    cephALEXin 500 MG capsule Commonly known as:  KEFLEX Take 1 capsule (500 mg total) by mouth 3 (three) times daily for 7 days.   clobetasol cream 0.05 % Commonly known as:  TEMOVATE Apply topically 2 (two) times daily.   furosemide 20 MG tablet Commonly known as:  LASIX Take 20 mg by mouth daily.   gabapentin 100 MG capsule Commonly known as:  NEURONTIN Take 100 mg by mouth 2 (two) times daily.   insulin lispro 100 UNIT/ML KiwkPen Commonly known as:  HUMALOG Inject 10 Units into the skin 3 (three) times daily before meals.   Lantus SoloStar 100 UNIT/ML Solostar Pen Generic drug:  Insulin Glargine Inject 35 Units into the skin at bedtime.   levothyroxine 75 MCG tablet Commonly known as:  SYNTHROID, LEVOTHROID Take 75 mcg by mouth daily before breakfast.   metFORMIN 1000 MG tablet Commonly known as:  GLUCOPHAGE Take 1,000 mg by mouth 2 (two) times daily with a meal.   pantoprazole 40 MG tablet Commonly known as:  PROTONIX Take 40 mg by mouth daily.   predniSONE 10 MG tablet Commonly known as:  DELTASONE 2 tabs po day1; 1 tab po day2; 1/2 tab po day3,4 Start taking on:  October 12, 2018   QUEtiapine 25 MG tablet Commonly known as:  SEROQUEL Take 1 tablet (25 mg total) by mouth at bedtime.   telmisartan 40 MG tablet Commonly known as:  MICARDIS Take 40 mg by mouth daily.   vitamin B-12 1000 MCG tablet Commonly known as:  CYANOCOBALAMIN Take 1,000 mcg by mouth daily.            Durable Medical Equipment  (From admission, onward)         Start     Ordered   10/11/18 1013  For home use only DME oxygen  Once    Comments:  Patient will use her inogen during the day  Question Answer Comment  Mode or (Route) Nasal cannula   Liters per Minute 3   Frequency Continuous (stationary and portable oxygen unit needed)   Oxygen conserving device Yes   Oxygen delivery system Gas      10/11/18 1013           DISCHARGE INSTRUCTIONS:   Follow-up with PMD 5  days Follow-up pulmonology 1 week  If you experience worsening of your admission symptoms, develop shortness of breath, life threatening emergency, suicidal or homicidal thoughts you must seek medical attention immediately by calling 911 or calling your MD immediately  if symptoms less severe.  You Must read complete instructions/literature along with all the possible adverse reactions/side effects for all the Medicines you take and that have been prescribed to you.  Take any new Medicines after you have completely understood and accept all the possible adverse reactions/side effects.   Please note  You were cared for by a hospitalist during your hospital stay. If you have any questions about your discharge medications or the care you received while you were in the hospital after you are discharged, you can call the unit and asked to speak with the hospitalist on call if the hospitalist that took care of you is not available. Once you are discharged, your primary care physician will handle any further medical issues. Please note that NO REFILLS for any discharge medications will be authorized once you are discharged, as it is imperative that you return to your primary care physician (or establish a relationship with a primary care physician if you do not have one) for your aftercare needs so that they can reassess your need for medications and monitor your lab values.    Today   CHIEF COMPLAINT:  No chief complaint on file.   HISTORY OF PRESENT ILLNESS:  Lori Blanchard  is a 73 y.o. female admitted with hypoxia after bronchoscopy   VITAL SIGNS:  Blood pressure 126/66, pulse 93, temperature 98 F (36.7 C), temperature source Oral, resp. rate 18, SpO2 96 %.   PHYSICAL EXAMINATION:  GENERAL:  73 y.o.-year-old patient lying in the bed with no acute distress.  EYES: Pupils equal, round, reactive to light and accommodation. No scleral icterus. Extraocular muscles intact.  HEENT: Head  atraumatic, normocephalic. Oropharynx and nasopharynx clear.  NECK:  Supple, no jugular venous distention. No thyroid enlargement, no tenderness.  LUNGS: Decreased breath sounds bilaterally, slight expiratory wheezing at the bases. No use of accessory muscles of respiration.  CARDIOVASCULAR: S1, S2 normal. No murmurs, rubs, or gallops.  ABDOMEN: Soft, non-tender, non-distended. Bowel sounds present. No organomegaly or mass.  EXTREMITIES: No pedal edema, cyanosis, or clubbing.  NEUROLOGIC: Cranial nerves II through XII are intact. Muscle strength 5/5 in all extremities. Sensation intact. Gait not checked.  PSYCHIATRIC: The patient is alert and oriented x 3.  SKIN: No obvious rash, lesion, or ulcer.   DATA REVIEW:   CBC Recent Labs  Lab 10/11/18 0503  WBC 15.1*  HGB 9.8*  HCT 33.4*  PLT 333    Chemistries  Recent Labs  Lab 10/11/18 0503  NA 138  K 4.0  CL 105  CO2 21*  GLUCOSE 231*  BUN 19  CREATININE 0.98  CALCIUM 8.2*       RADIOLOGY:  X-ray Chest Pa Or Ap  Result Date: 10/10/2018 CLINICAL DATA:  Hypoxia, status post bronchoscopy EXAM: CHEST  1 VIEW COMPARISON:  PET CT 10/01/2018, CT chest 09/19/2018 FINDINGS: Chronic interstitial lung disease. Slight increased mid to basilar interstitial and basilar alveolar disease. Cardiomegaly. No pneumothorax. IMPRESSION: 1. Chronic interstitial lung disease with suspected acute superimposed interstitial and alveolar disease at the bases. 2. Cardiomegaly Electronically Signed   By: Donavan Foil M.D.   On: 10/10/2018 17:23     Management plans discussed with the patient, family and they are in agreement.  CODE STATUS:     Code Status Orders  (From admission, onward)         Start     Ordered   10/10/18 2015  Full code  Continuous     10/10/18 2014        Code Status History    This patient has a current code status but no historical code status.    Advance Directive Documentation  Most Recent Value  Type of  Advance Directive  Healthcare Power of Attorney, Living will  Pre-existing out of facility DNR order (yellow form or pink MOST form)  -  "MOST" Form in Place?  -      TOTAL TIME TAKING CARE OF THIS PATIENT: 35 minutes.    Loletha Grayer M.D on 10/11/2018 at 1:50 PM  Between 7am to 6pm - Pager - [phone]  After 6pm go to [email or web address] - password [email or web address]  [employer] Office  [phone]  CC: Primary care physician; [name], MD

## 2018-10-11 NOTE — Care Management Note (Signed)
Case Management Note  Patient Details  Name: NYSHA KOPLIN MRN: [id number] Date of Birth: 1946/05/08  Subjective/Objective:  Patient to discharge with full time O2. Patient currently on oxygen at night. She has recently canceled her order with [employer] in order to use [employer]. Per patient she is to call [employer] when she discharges and arrange them to get her concentrator. [employer] will provide DME from here on out per patient choice. Portable tank at bedside from [employer]. No other needs.                   Action/Plan:   Expected Discharge Date:  10/11/18               Expected Discharge Plan:  Home/Self Care  In-House Referral:     Discharge planning Services  CM Consult  Post Acute Care Choice:  Durable Medical Equipment Choice offered to:  Patient  DME Arranged:  Oxygen DME Agency:  [facility]  HH Arranged:    [place] Agency:     Status of Service:  Completed, signed off  If discussed at [place] of Stay Meetings, dates discussed:    Additional Comments:  Latanya Maudlin, RN 10/11/2018, 11:23 AM

## 2018-10-11 NOTE — Progress Notes (Signed)
Discharge order received. Patient is alert and oriented. Vital signs stable . No signs of acute distress. Discharge instructions given. Patient verbalized understanding. No other issues noted at this time.   

## 2018-10-13 ENCOUNTER — Encounter: Payer: Self-pay | Admitting: Pulmonary Disease

## 2018-10-13 NOTE — Op Note (Signed)
[facility] Patient Name: Lori Blanchard Procedure Date: 10/10/2018 1:37 PM MRN: [id number] Account #: [id number] Date of Birth: 15-Feb-1946 Admit Type: Outpatient Age: 73 Room: [place] Suite on 2nd floor Gender: Female Note Status: Finalized Attending MD: Ottie Glazier MD, MD Procedure:         Bronchoscopy Indications:       Hilar lymphadenopathy of the right side Providers:         Ottie Glazier MD, MD Referring MD:       Medicines:         General Anesthesia Complications:     No immediate complications Procedure:         Pre-Anesthesia Assessment:                    - A History and Physical has been performed. Patient meds                     and allergies have been reviewed. The risks and benefits                     of the procedure and the sedation options and risks were                     discussed with the patient. All questions were answered                     and informed consent was obtained. Patient identification                     and proposed procedure were verified prior to the                     procedure by the physician. Mental Status Examination:                     normal. Airway Examination: Mallampati Class III (part of                     the uvula and soft palate visualized). Respiratory                     Examination: rhonchi. ASA Grade Assessment: III - A                     patient with severe systemic disease. After reviewing the                     risks and benefits, the patient was deemed in satisfactory                     condition to undergo the procedure. The anesthesia plan                     was to use deep sedation / analgesia. Immediately prior to                     administration of medications, the patient was re-assessed                     for adequacy to receive sedatives. The heart rate,                     respiratory rate, oxygen saturations, blood pressure,  adequacy of pulmonary ventilation,  and response to care                     were monitored throughout the procedure. The physical                     status of the patient was re-assessed after the procedure.                    After obtaining informed consent, the bronchoscope was                     passed under direct vision. Throughout the procedure, the                     patient's blood pressure, pulse, and oxygen saturations                     were monitored continuously. the Bronchoscope was                     introduced through the nose, via the endotracheal tube and                     advanced to the trachea. the Bronchoscope was introduced                     through the nose, via the endotracheal tube and advanced                     to the tracheobronchial tree of both lungs. The patient                     tolerated the procedure well. Findings:      Lymph Nodes: A few abnormal lymph nodes were visualized as examined by       endobronchial ultrasound (EBUS). Impression:        - Hilar lymphadenopathy of the right side                    - A few abnormal lymph nodes.                    - No specimens collected. Recommendation:    - Await test results. Ottie Glazier, MD Ottie Glazier MD, MD 10/10/2018 3:22:35 PM This report has been signed electronically. Number of Addenda: 0 Note Initiated On: 10/10/2018 1:37 PM      [facility]

## 2018-10-14 DIAGNOSIS — J9611 Chronic respiratory failure with hypoxia: Secondary | ICD-10-CM | POA: Insufficient documentation

## 2018-10-14 LAB — CYTOLOGY - NON PAP

## 2018-10-21 ENCOUNTER — Encounter: Payer: Self-pay | Admitting: *Deleted

## 2018-10-21 NOTE — Progress Notes (Signed)
  Oncology Nurse Navigator Documentation  Navigator Location: [facility]-Med Onc (10/21/18 1100) Referral date to RadOnc/MedOnc: 10/21/18 (10/21/18 1100) )Navigator Encounter Type: Other (10/21/18 1100)   Abnormal Finding Date: 09/19/18 (10/21/18 1100)                   Treatment Phase: Abnormal Scans (10/21/18 1100) Barriers/Navigation Needs: Coordination of Care (10/21/18 1100)   Interventions: Coordination of Care (10/21/18 1100)   Coordination of Care: Appts (10/21/18 1100)        Acuity: Level 2 (10/21/18 1100)   Acuity Level 2: Initial guidance, education and coordination as needed;Educational needs;Assistance expediting appointments (10/21/18 1100)    referral received from Dr. Raul Del for lung nodule. Spoke with Dr. Grayland Ormond who agreed to see patient on Friday 3/20 in the lung multidisciplinary clinic. Pt will be presented and discussed at cancer case conference on Thursday 3/19. Nothing further needed at this time.  Time Spent with Patient: 30 (10/21/18 1100)

## 2018-10-24 ENCOUNTER — Inpatient Hospital Stay: Payer: Medicare Other | Admitting: Oncology

## 2018-10-31 ENCOUNTER — Ambulatory Visit: Payer: Self-pay | Admitting: Cardiothoracic Surgery

## 2019-01-02 ENCOUNTER — Encounter: Payer: Self-pay | Admitting: *Deleted

## 2019-01-02 NOTE — Progress Notes (Signed)
  Oncology Nurse Navigator Documentation  Navigator Location: [facility] Onc (01/02/19 0800)   )Navigator Encounter Type: Telephone (01/02/19 0800) Telephone: Lori Blanchard Call;Patient Update (01/02/19 0800)                       Barriers/Navigation Needs: Coordination of Care (01/02/19 0800)   Interventions: Coordination of Care (01/02/19 0800)   Coordination of Care: Appts (01/02/19 0800)         Phone call made to patient to see if she wanted to reschedule her appt at the [facility] since it was cancelled at the end of March due to COVID-19. Pt not available at the time of call but spoke with pt's daughter, Lori Blanchard. Per Lori Blanchard, pt wants to wait until she follows up with Dr. Raul Del on 6/16 for repeat CT scan before rescheduling appt with med-onc. Contact info given and instructed to call back if desires to follow up at the [facility] after seeing Dr. Raul Del and getting repeat CT scan. Lori Blanchard verbalized understanding.          Time Spent with Patient: 30 (01/02/19 0800)

## 2019-01-08 ENCOUNTER — Other Ambulatory Visit: Payer: Self-pay | Admitting: Specialist

## 2019-01-08 DIAGNOSIS — R0602 Shortness of breath: Secondary | ICD-10-CM

## 2019-01-13 ENCOUNTER — Other Ambulatory Visit: Payer: Self-pay

## 2019-01-13 ENCOUNTER — Ambulatory Visit
Admission: RE | Admit: 2019-01-13 | Discharge: 2019-01-13 | Disposition: A | Discharge status: Home / Self Care | Payer: Medicare Other | Source: Ambulatory Visit | Attending: Specialist | Admitting: Specialist

## 2019-01-13 DIAGNOSIS — R0602 Shortness of breath: Secondary | ICD-10-CM | POA: Insufficient documentation

## 2019-01-28 ENCOUNTER — Encounter: Payer: Self-pay | Admitting: Radiation Oncology

## 2019-01-28 ENCOUNTER — Encounter: Payer: Self-pay | Admitting: *Deleted

## 2019-01-28 ENCOUNTER — Other Ambulatory Visit: Payer: Self-pay

## 2019-01-28 ENCOUNTER — Ambulatory Visit
Admission: RE | Admit: 2019-01-28 | Discharge: 2019-01-28 | Disposition: A | Discharge status: Home / Self Care | Payer: Medicare Other | Source: Ambulatory Visit | Attending: Radiation Oncology | Admitting: Radiation Oncology

## 2019-01-28 VITALS — BP 149/76 | HR 97 | Temp 98.1°F | Resp 16 | Wt 157.3 lb

## 2019-01-28 DIAGNOSIS — I509 Heart failure, unspecified: Secondary | ICD-10-CM | POA: Insufficient documentation

## 2019-01-28 DIAGNOSIS — Z87442 Personal history of urinary calculi: Secondary | ICD-10-CM | POA: Insufficient documentation

## 2019-01-28 DIAGNOSIS — E119 Type 2 diabetes mellitus without complications: Secondary | ICD-10-CM | POA: Insufficient documentation

## 2019-01-28 DIAGNOSIS — J449 Chronic obstructive pulmonary disease, unspecified: Secondary | ICD-10-CM | POA: Diagnosis not present

## 2019-01-28 DIAGNOSIS — G4730 Sleep apnea, unspecified: Secondary | ICD-10-CM | POA: Diagnosis not present

## 2019-01-28 DIAGNOSIS — M810 Age-related osteoporosis without current pathological fracture: Secondary | ICD-10-CM | POA: Diagnosis not present

## 2019-01-28 DIAGNOSIS — K219 Gastro-esophageal reflux disease without esophagitis: Secondary | ICD-10-CM | POA: Diagnosis not present

## 2019-01-28 DIAGNOSIS — Z79899 Other long term (current) drug therapy: Secondary | ICD-10-CM | POA: Diagnosis not present

## 2019-01-28 DIAGNOSIS — F0280 Dementia in other diseases classified elsewhere without behavioral disturbance: Secondary | ICD-10-CM | POA: Diagnosis not present

## 2019-01-28 DIAGNOSIS — Z800 Family history of malignant neoplasm of digestive organs: Secondary | ICD-10-CM | POA: Insufficient documentation

## 2019-01-28 DIAGNOSIS — E785 Hyperlipidemia, unspecified: Secondary | ICD-10-CM | POA: Insufficient documentation

## 2019-01-28 DIAGNOSIS — J849 Interstitial pulmonary disease, unspecified: Secondary | ICD-10-CM | POA: Insufficient documentation

## 2019-01-28 DIAGNOSIS — Z7982 Long term (current) use of aspirin: Secondary | ICD-10-CM | POA: Diagnosis not present

## 2019-01-28 DIAGNOSIS — E039 Hypothyroidism, unspecified: Secondary | ICD-10-CM | POA: Diagnosis not present

## 2019-01-28 DIAGNOSIS — R918 Other nonspecific abnormal finding of lung field: Secondary | ICD-10-CM | POA: Insufficient documentation

## 2019-01-28 DIAGNOSIS — F329 Major depressive disorder, single episode, unspecified: Secondary | ICD-10-CM | POA: Insufficient documentation

## 2019-01-28 DIAGNOSIS — Z794 Long term (current) use of insulin: Secondary | ICD-10-CM | POA: Insufficient documentation

## 2019-01-28 DIAGNOSIS — Z803 Family history of malignant neoplasm of breast: Secondary | ICD-10-CM | POA: Diagnosis not present

## 2019-01-28 NOTE — Progress Notes (Signed)
  Oncology Nurse Navigator Documentation  Navigator Location: [facility] (01/28/19 1300)   )Navigator Encounter Type: Initial RadOnc (01/28/19 1300)   Abnormal Finding Date: 10/01/18 (01/28/19 1300)                   Treatment Phase: Pre-Tx/Tx Discussion (01/28/19 1300) Barriers/Navigation Needs: No Barriers At This Time (01/28/19 1300)   Interventions: None Required (01/28/19 1300)            Acuity: Level 1 (01/28/19 1300) Acuity Level 1: Initial guidance, education and coordination as needed;Minimal follow up required (01/28/19 1300)  met with patient during initial rad-onc consultation. All questions answered during visit. Reviewed upcoming appts. Contact info given and instructed to call with any further questions or needs. Pt and daughter verbalized understanding. Nothing further needed at this time.     Time Spent with Patient: 45 (01/28/19 1300)

## 2019-01-28 NOTE — Consult Note (Signed)
NEW PATIENT EVALUATION  Name: Lori Blanchard  MRN: [id number]  Date:   01/28/2019     DOB: [birth year]   This 73 y.o. female patient presents to the clinic for initial evaluation of presumed stage I non-small cell lung cancer of the right lower lobe.  REFERRING PHYSICIAN: Erby Pian, MD  CHIEF COMPLAINT:  Chief Complaint  Patient presents with  . Lung Cancer    Initial consultation    DIAGNOSIS: The encounter diagnosis was Right lower lobe lung mass.   PREVIOUS INVESTIGATIONS:  CT scan and PET CT scan reviewed Clinical notes reviewed  HPI: Patient is a 73 year old female with significant COPD emphysema who they have been watching for a hypermetabolic lesion in the right lower lobe.  Patient had undergone undergone bronchoscopy back in March 2020 although cytology was negative.  PET CT scan was performed in February showing hypermetabolic activity in right lower lobe compatible with bronchogenic carcinoma.  She had a follow-up CT scan this month shows progressive sub-solid right lower lobe pulmonary nodule worrisome for primary bronchogenic carcinoma.  She does have stable chronic interstitial lung disease.  Patient has refused CT-guided biopsy.  She is now referred to radiation oncology for consideration of SBRT treatment to her right lower lobe lesion.  PET CT scan does not demonstrate any evidence of mediastinal or hilar adenopathy.  PLANNED TREATMENT REGIMEN: SBRT  PAST MEDICAL HISTORY:  has a past medical history of Alzheimer disease ([place]), Anxiety, Anxiety and depression, Asthma, Cancer ([place]), CHF (congestive heart failure) ([place]), COPD (chronic obstructive pulmonary disease) ([place]), Depression, Diabetes ([place]), Diverticulosis, Dyspnea, Elevated lipids, Fatty liver, GERD (gastroesophageal reflux disease), Gout, History of kidney stones, Hydronephrosis, Hyperlipemia, Hypertension, Hypothyroidism, Lower extremity edema, Osteoarthritis, Osteoarthritis, Osteoporosis, Pulmonary nodule,  Retroperitoneal fibrosis, Sleep apnea, Ureter injury, and Ureteropelvic junction (UPJ) obstruction.    PAST SURGICAL HISTORY:  Past Surgical History:  Procedure Laterality Date  . ABDOMINAL HYSTERECTOMY     partial  . ANKLE FRACTURE SURGERY Left    pins in ankle but not in correct location  . APPENDECTOMY  1965  . BREAST SURGERY Bilateral 1990   Reduction  . c setion    . CARPAL TUNNEL RELEASE Bilateral 2000  . CESAREAN SECTION  [id number],8242, 1969   x 3  . CYSTOSCOPY W/ RETROGRADES Right 11/02/2015   Procedure: CYSTOSCOPY WITH RETROGRADE PYELOGRAM, URETERAL STENT EXCHANGE;  Surgeon: [name], MD;  Location: [facility];  Service: Urology;  Laterality: Right;  . CYSTOSCOPY W/ RETROGRADES Right 01/31/2016   Procedure: CYSTOSCOPY WITH RETROGRADE PYELOGRAM;  Surgeon: [name], MD;  Location: [facility];  Service: Urology;  Laterality: Right;  . CYSTOSCOPY W/ RETROGRADES Bilateral 05/09/2016   Procedure: CYSTOSCOPY WITH RETROGRADE PYELOGRAM;  Surgeon: [name], MD;  Location: [facility];  Service: Urology;  Laterality: Bilateral;  . CYSTOSCOPY W/ RETROGRADES Left 11/21/2016   Procedure: CYSTOSCOPY WITH RETROGRADE PYELOGRAM;  Surgeon: [name], MD;  Location: [facility];  Service: Urology;  Laterality: Left;  . CYSTOSCOPY W/ RETROGRADES Right 03/27/2017   Procedure: CYSTOSCOPY WITH RETROGRADE PYELOGRAM;  Surgeon: [name], MD;  Location: [facility];  Service: Urology;  Laterality: Right;  . CYSTOSCOPY W/ RETROGRADES Bilateral 05/31/2017   Procedure: CYSTOSCOPY WITH RETROGRADE PYELOGRAM;  Surgeon: [name], MD;  Location: [facility];  Service: Urology;  Laterality: Bilateral;  . CYSTOSCOPY W/ RETROGRADES Right 07/22/2018   Procedure: CYSTOSCOPY WITH RETROGRADE PYELOGRAM;  Surgeon: Abbie Sons, MD;  Location: [facility];  Service: Urology;  Laterality:  Right;  Marland Kitchen CYSTOSCOPY W/ URETERAL STENT PLACEMENT Right 05/11/2015   Procedure: CYSTOSCOPY WITH STENT  REPLACEMENT;  Surgeon: [name], MD;  Location: [facility];  Service: Urology;  Laterality: Right;  . CYSTOSCOPY W/ URETERAL STENT PLACEMENT Right 01/31/2016   Procedure: CYSTOSCOPY, RETROGRADE PYELOGRAMS WITH STENT REPLACEMENT;  Surgeon: [name], MD;  Location: [facility];  Service: Urology;  Laterality: Right;  . CYSTOSCOPY W/ URETERAL STENT PLACEMENT Right 05/09/2016   Procedure: CYSTOSCOPY WITH STENT REPLACEMENT;  Surgeon: [name], MD;  Location: [facility];  Service: Urology;  Laterality: Right;  . CYSTOSCOPY W/ URETERAL STENT PLACEMENT Bilateral 07/04/2016   Procedure: CYSTOSCOPY WITH STENT REPLACEMENT;  Surgeon: [name], MD;  Location: [facility];  Service: Urology;  Laterality: Bilateral;  . CYSTOSCOPY W/ URETERAL STENT PLACEMENT Right 09/14/2016   Procedure: CYSTOSCOPY WITH STENT REPLACEMENT;  Surgeon: [name], MD;  Location: [facility];  Service: Urology;  Laterality: Right;  . CYSTOSCOPY W/ URETERAL STENT PLACEMENT Right 11/21/2016   Procedure: CYSTOSCOPY WITH STENT REPLACEMENT;  Surgeon: [name], MD;  Location: [facility];  Service: Urology;  Laterality: Right;  . CYSTOSCOPY W/ URETERAL STENT PLACEMENT Right 01/25/2017   Procedure: CYSTOSCOPY WITH STENT REPLACEMENT;  Surgeon: [name], MD;  Location: [facility];  Service: Urology;  Laterality: Right;  . CYSTOSCOPY W/ URETERAL STENT PLACEMENT Right 03/27/2017   Procedure: CYSTOSCOPY WITH STENT REPLACEMENT;  Surgeon: [name], MD;  Location: [facility];  Service: Urology;  Laterality: Right;  . CYSTOSCOPY W/ URETERAL STENT PLACEMENT Right 05/31/2017   Procedure: CYSTOSCOPY WITH STENT REPLACEMENT;  Surgeon: [name], MD;  Location: [facility];  Service: Urology;  Laterality: Right;  . CYSTOSCOPY W/ URETERAL STENT PLACEMENT Right 08/02/2017   Procedure: CYSTOSCOPY WITH STENT REPLACEMENT;  Surgeon: [name], MD;  Location: [facility];  Service: Urology;   Laterality: Right;  . CYSTOSCOPY W/ URETERAL STENT PLACEMENT Right 10/29/2017   Procedure: CYSTOSCOPY WITH STENT REPLACEMENT;  Surgeon: Abbie Sons, MD;  Location: [facility];  Service: Urology;  Laterality: Right;  . CYSTOSCOPY W/ URETERAL STENT PLACEMENT Right 02/14/2018   Procedure: CYSTOSCOPY WITH RETROGRADE PYELOGRAM/URETERAL STENT Exchange;  Surgeon: Abbie Sons, MD;  Location: [facility];  Service: Urology;  Laterality: Right;  . CYSTOSCOPY W/ URETERAL STENT REMOVAL Left 09/14/2016   Procedure: CYSTOSCOPY WITH STENT REMOVAL;  Surgeon: [name], MD;  Location: [facility];  Service: Urology;  Laterality: Left;  . CYSTOSCOPY WITH STENT PLACEMENT Left 05/09/2016   Procedure: CYSTOSCOPY WITH STENT PLACEMENT;  Surgeon: [name], MD;  Location: [facility];  Service: Urology;  Laterality: Left;  . CYSTOSCOPY WITH STENT PLACEMENT Right 07/22/2018   Procedure: CYSTOSCOPY WITH STENT Exchange;  Surgeon: Abbie Sons, MD;  Location: [facility];  Service: Urology;  Laterality: Right;  . EYE SURGERY Left 2018   tear duct reconstruction  . HERNIA REPAIR  2694   umbilical  . JOINT REPLACEMENT Left 2013   TKR  . JOINT REPLACEMENT Right 2011   TKR  . LAPAROSCOPIC HYSTERECTOMY    . REDUCTION MAMMAPLASTY    . REPLACEMENT TOTAL KNEE Bilateral   . SKIN CANCER EXCISION Left 2018   lower eye lid  . URETEROSCOPY Bilateral 05/09/2016   Procedure: URETEROSCOPY;  Surgeon: [name], MD;  Location: [facility];  Service: Urology;  Laterality: Bilateral;  . URETEROSCOPY WITH HOLMIUM LASER LITHOTRIPSY Left 07/04/2016   Procedure: URETEROSCOPY WITH HOLMIUM LASER LITHOTRIPSY;  Surgeon: Horald Pollen  Pilar Jarvis, MD;  Location: [facility];  Service: Urology;  Laterality: Left;    FAMILY HISTORY: family history includes Breast cancer in her maternal aunt; Breast cancer (age of onset: 38) in her mother; Colon cancer in her mother; Diabetes in her daughter; Kidney disease in her daughter; Liver cancer  in her mother.  SOCIAL HISTORY:  reports that she has never smoked. She has never used smokeless tobacco. She reports that she does not drink alcohol or use drugs.  ALLERGIES: Ibuprofen  MEDICATIONS:  Current Outpatient Medications  Medication Sig Dispense Refill  . albuterol (PROVENTIL HFA;VENTOLIN HFA) 108 (90 Base) MCG/ACT inhaler Inhale 2 puffs into the lungs every 4 (four) hours as needed for wheezing or shortness of breath.    Marland Kitchen alendronate (FOSAMAX) 70 MG tablet Take 70 mg by mouth every Friday.     Marland Kitchen aspirin EC 81 MG tablet Take 81 mg by mouth daily.     Marland Kitchen atorvastatin (LIPITOR) 40 MG tablet Take 40 mg by mouth daily.     Marland Kitchen buPROPion (WELLBUTRIN XL) 300 MG 24 hr tablet Take 1 tablet (300 mg total) by mouth daily. 90 tablet 3  . furosemide (LASIX) 20 MG tablet Take 20 mg by mouth daily.     Marland Kitchen gabapentin (NEURONTIN) 100 MG capsule Take 100 mg by mouth 2 (two) times daily.   0  . insulin lispro (HUMALOG) 100 UNIT/ML KiwkPen Inject 12 Units into the skin 3 (three) times daily before meals.     Marland Kitchen LANTUS SOLOSTAR 100 UNIT/ML Solostar Pen Inject 35 Units into the skin at bedtime.     Marland Kitchen levothyroxine (SYNTHROID, LEVOTHROID) 75 MCG tablet Take 75 mcg by mouth daily before breakfast.    . pantoprazole (PROTONIX) 40 MG tablet Take 40 mg by mouth daily.     . QUEtiapine (SEROQUEL) 25 MG tablet Take 1 tablet (25 mg total) by mouth at bedtime. 90 tablet 3  . telmisartan (MICARDIS) 40 MG tablet Take 40 mg by mouth daily.     Marland Kitchen umeclidinium-vilanterol (ANORO ELLIPTA) 62.5-25 MCG/INH AEPB Inhale 1 puff into the lungs daily.    . vitamin B-12 (CYANOCOBALAMIN) 1000 MCG tablet Take 1,000 mcg by mouth daily.    . clobetasol cream (TEMOVATE) 0.05 % Apply topically 2 (two) times daily. (Patient not taking: Reported on 01/28/2019) 30 g 0  . metFORMIN (GLUCOPHAGE) 1000 MG tablet Take 1,000 mg by mouth 2 (two) times daily with a meal.     No current facility-administered medications for this encounter.      ECOG PERFORMANCE STATUS:  0 - Asymptomatic  REVIEW OF SYSTEMS: Patient denies any weight loss, fatigue, weakness, fever, chills or night sweats. Patient denies any loss of vision, blurred vision. Patient denies any ringing  of the ears or hearing loss. No irregular heartbeat. Patient denies heart murmur or history of fainting. Patient denies any chest pain or pain radiating to her upper extremities. Patient denies any shortness of breath, difficulty breathing at night, cough or hemoptysis. Patient denies any swelling in the lower legs. Patient denies any nausea vomiting, vomiting of blood, or coffee ground material in the vomitus. Patient denies any stomach pain. Patient states has had normal bowel movements no significant constipation or diarrhea. Patient denies any dysuria, hematuria or significant nocturia. Patient denies any problems walking, swelling in the joints or loss of balance. Patient denies any skin changes, loss of hair or loss of weight. Patient denies any excessive worrying or anxiety or significant depression. Patient denies any problems  with insomnia. Patient denies excessive thirst, polyuria, polydipsia. Patient denies any swollen glands, patient denies easy bruising or easy bleeding. Patient denies any recent infections, allergies or URI. Patient "s visual fields have not changed significantly in recent time.   PHYSICAL EXAM: BP (!) 149/76 (BP Location: Left Arm, Patient Position: Sitting)   Pulse 97   Temp 98.1 F (36.7 C) (Tympanic)   Resp 16   Wt 157 lb 4.8 oz (71.3 kg)   BMI 35.27 kg/m  Well-developed well-nourished patient in NAD. HEENT reveals PERLA, EOMI, discs not visualized.  Oral cavity is clear. No oral mucosal lesions are identified. Neck is clear without evidence of cervical or supraclavicular adenopathy. Lungs are clear to A&P. Cardiac examination is essentially unremarkable with regular rate and rhythm without murmur rub or thrill. Abdomen is benign with no  organomegaly or masses noted. Motor sensory and DTR levels are equal and symmetric in the upper and lower extremities. Cranial nerves II through XII are grossly intact. Proprioception is intact. No peripheral adenopathy or edema is identified. No motor or sensory levels are noted. Crude visual fields are within normal range.  LABORATORY DATA: Pathology report reviewed    RADIOLOGY RESULTS: CT scans and PET CT scan reviewed and compatible with above-stated findings   IMPRESSION: Stage I primary bronchogenic carcinoma the right lower lobe in a 73 year old female  PLAN: At this time of offer SBRT treatment to her right lower lobe lesion.  Would plan on delivering 6000 cGy in 5 fractions.  Risks and benefits of treatment including skin reaction fatigue possible development of cough all were discussed in detail.  Patient comprehends my treatment plan well.  I have personally set up and ordered CT simulation for SBRT early next week.  We will use motion restriction protocol as well as 4-dimensional treatment planning for treatment planning purposes.  I would like to take this opportunity to thank you for allowing me to participate in the care of your patient.Noreene Filbert, MD

## 2019-01-30 ENCOUNTER — Telehealth: Payer: Self-pay

## 2019-01-30 ENCOUNTER — Other Ambulatory Visit: Payer: Self-pay

## 2019-01-30 NOTE — Telephone Encounter (Signed)
Patients daughter called stating that since patient was diagnosed with lung cancer, she is no longer able to be put under anesthesia according to her provider. Daughter states that the stent is bothering the patient and she needs to what the next step will be as surgery is now not an option. Will send to provider and advise patient

## 2019-01-30 NOTE — Telephone Encounter (Signed)
Stent placement under IV sedation could be considered.  The only other option would be placement of a percutaneous nephrostomy tube by interventional radiology.

## 2019-02-02 ENCOUNTER — Other Ambulatory Visit: Payer: Self-pay

## 2019-02-02 ENCOUNTER — Ambulatory Visit
Admission: RE | Admit: 2019-02-02 | Discharge: 2019-02-02 | Disposition: A | Discharge status: Home / Self Care | Payer: Medicare Other | Source: Ambulatory Visit | Attending: Radiation Oncology | Admitting: Radiation Oncology

## 2019-02-02 ENCOUNTER — Encounter: Payer: Self-pay | Admitting: *Deleted

## 2019-02-02 DIAGNOSIS — Z510 Encounter for antineoplastic radiation therapy: Secondary | ICD-10-CM | POA: Insufficient documentation

## 2019-02-02 DIAGNOSIS — R911 Solitary pulmonary nodule: Secondary | ICD-10-CM | POA: Insufficient documentation

## 2019-02-02 NOTE — Progress Notes (Signed)
  Oncology Nurse Navigator Documentation  Navigator Location: [facility] (02/02/19 1100)   )Navigator Encounter Type: Lobby (02/02/19 1100)                     Patient Visit Type: FEXMDY (02/02/19 1100) Treatment Phase: CT SIM (02/02/19 1100) Barriers/Navigation Needs: No Barriers At This Time (02/02/19 1100)   Interventions: None Required (02/02/19 1100)           met with patient in the lobby prior to [place] today. All questions answered during visit. Pt did not have any needs or barriers at this time. Will follow up at first SBRT treatment. Instructed pt to call if has any questions or needs. Pt verbalized understanding.            Time Spent with Patient: 30 (02/02/19 1100)

## 2019-02-03 NOTE — Telephone Encounter (Signed)
LMOM with [name] patient's daughter to inform her of the options for stent replacement.

## 2019-02-03 NOTE — Telephone Encounter (Signed)
Spoke to patient's daughter and she wants to come in to discuss the stent exchange options. She is scheduled for 8/4 first available. She states her mother is in a lot of pain and is wanting to see if you could prescribe her pain medication in the meantime. She is starting chemotherapy.

## 2019-02-04 DIAGNOSIS — Z510 Encounter for antineoplastic radiation therapy: Secondary | ICD-10-CM | POA: Insufficient documentation

## 2019-02-04 DIAGNOSIS — R911 Solitary pulmonary nodule: Secondary | ICD-10-CM | POA: Insufficient documentation

## 2019-02-05 NOTE — Telephone Encounter (Signed)
Patient notified and she will call the Oncologist.

## 2019-02-05 NOTE — Telephone Encounter (Signed)
I would recommend addressing pain medication with her oncology doctors.

## 2019-02-16 ENCOUNTER — Encounter: Payer: Self-pay | Admitting: *Deleted

## 2019-02-16 ENCOUNTER — Ambulatory Visit
Admission: RE | Admit: 2019-02-16 | Discharge: 2019-02-16 | Disposition: A | Discharge status: Home / Self Care | Payer: Medicare Other | Source: Ambulatory Visit | Attending: Radiation Oncology | Admitting: Radiation Oncology

## 2019-02-16 ENCOUNTER — Other Ambulatory Visit: Payer: Self-pay

## 2019-02-16 DIAGNOSIS — R911 Solitary pulmonary nodule: Secondary | ICD-10-CM | POA: Diagnosis not present

## 2019-02-16 NOTE — Progress Notes (Signed)
  Oncology Nurse Navigator Documentation  Navigator Location: [facility] (02/16/19 1500)   )                    Treatment Initiated Date: 02/16/19 (02/16/19 1500)   Treatment Phase: First Radiation Tx (02/16/19 1500) Barriers/Navigation Needs: No Barriers At This Time (02/16/19 1500)   Interventions: None Required (02/16/19 1500)                      Time Spent with Patient: 15 (02/16/19 1500)

## 2019-02-18 ENCOUNTER — Ambulatory Visit
Admission: RE | Admit: 2019-02-18 | Discharge: 2019-02-18 | Disposition: A | Discharge status: Home / Self Care | Payer: Medicare Other | Source: Ambulatory Visit | Attending: Radiation Oncology | Admitting: Radiation Oncology

## 2019-02-18 ENCOUNTER — Other Ambulatory Visit: Payer: Self-pay

## 2019-02-18 DIAGNOSIS — R911 Solitary pulmonary nodule: Secondary | ICD-10-CM | POA: Diagnosis not present

## 2019-02-20 ENCOUNTER — Encounter: Payer: Self-pay | Admitting: *Deleted

## 2019-02-23 ENCOUNTER — Ambulatory Visit
Admission: RE | Admit: 2019-02-23 | Discharge: 2019-02-23 | Disposition: A | Discharge status: Home / Self Care | Payer: Medicare Other | Source: Ambulatory Visit | Attending: Radiation Oncology | Admitting: Radiation Oncology

## 2019-02-23 ENCOUNTER — Other Ambulatory Visit: Payer: Self-pay

## 2019-02-23 DIAGNOSIS — R911 Solitary pulmonary nodule: Secondary | ICD-10-CM | POA: Diagnosis not present

## 2019-02-25 ENCOUNTER — Other Ambulatory Visit: Payer: Self-pay

## 2019-02-25 ENCOUNTER — Ambulatory Visit
Admission: RE | Admit: 2019-02-25 | Discharge: 2019-02-25 | Disposition: A | Discharge status: Home / Self Care | Payer: Medicare Other | Source: Ambulatory Visit | Attending: Radiation Oncology | Admitting: Radiation Oncology

## 2019-02-25 DIAGNOSIS — R911 Solitary pulmonary nodule: Secondary | ICD-10-CM | POA: Diagnosis not present

## 2019-03-02 ENCOUNTER — Other Ambulatory Visit: Payer: Self-pay

## 2019-03-02 ENCOUNTER — Ambulatory Visit
Admission: RE | Admit: 2019-03-02 | Discharge: 2019-03-02 | Disposition: A | Discharge status: Home / Self Care | Payer: Medicare Other | Source: Ambulatory Visit | Attending: Radiation Oncology | Admitting: Radiation Oncology

## 2019-03-02 DIAGNOSIS — R911 Solitary pulmonary nodule: Secondary | ICD-10-CM | POA: Diagnosis not present

## 2019-03-10 ENCOUNTER — Encounter: Payer: Self-pay | Admitting: Urology

## 2019-03-10 ENCOUNTER — Other Ambulatory Visit: Payer: Medicare Other

## 2019-03-10 ENCOUNTER — Other Ambulatory Visit: Payer: Self-pay

## 2019-03-10 ENCOUNTER — Ambulatory Visit (INDEPENDENT_AMBULATORY_CARE_PROVIDER_SITE_OTHER): Payer: Medicare Other | Admitting: Urology

## 2019-03-10 ENCOUNTER — Other Ambulatory Visit: Payer: Self-pay | Admitting: Radiology

## 2019-03-10 VITALS — BP 147/75 | HR 101 | Ht <= 58 in | Wt 156.1 lb

## 2019-03-10 DIAGNOSIS — Z01818 Encounter for other preprocedural examination: Secondary | ICD-10-CM

## 2019-03-10 DIAGNOSIS — N135 Crossing vessel and stricture of ureter without hydronephrosis: Secondary | ICD-10-CM

## 2019-03-10 LAB — MICROSCOPIC EXAMINATION
Bacteria, UA: NONE SEEN
RBC, Urine: NONE SEEN /hpf (ref 0–2)

## 2019-03-10 LAB — URINALYSIS, COMPLETE
Bilirubin, UA: NEGATIVE
Glucose, UA: NEGATIVE
Ketones, UA: NEGATIVE
Nitrite, UA: NEGATIVE
Protein,UA: NEGATIVE
RBC, UA: NEGATIVE
Specific Gravity, UA: 1.01 (ref 1.005–1.030)
Urobilinogen, Ur: 1 mg/dL (ref 0.2–1.0)
pH, UA: 7 (ref 5.0–7.5)

## 2019-03-10 NOTE — Progress Notes (Signed)
03/10/2019 9:53 AM   Lori Blanchard 02-21-1946 [id number]  Referring provider: [name], MD [place] [facility] [place],  [place] [address]  Chief Complaint  Patient presents with  . Follow-up    HPI: 73 y.o. 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  Her last stent exchange was December 2019.  She has COPD and interstitial lung disease and her daughter states after her last stent exchange she required home oxygen and that her pulmonologist recommended no further general anesthesia for stent exchanges.  They present today to discuss options.  She is currently off O2 unless she is having increased activity.  She has intermittent right flank pain.  She was also recently diagnosed with lung cancer and has completed radiation.  PMH: Past Medical History:  Diagnosis Date  . Alzheimer disease (Lori Blanchard)   . Anxiety   . Anxiety and depression   . Asthma   . Cancer (Lori Blanchard)    Basal Cell Skin Cancer  . CHF (congestive heart failure) (Lori Blanchard)    patient denies this diagnosis  . COPD (chronic obstructive pulmonary disease) ([place])   . Depression   . Diabetes (Lori Blanchard)   . Diverticulosis    patient denies problems for this  . Dyspnea   . Elevated lipids   . Fatty liver   . GERD (gastroesophageal reflux disease)   . Gout    no medication at this time  . History of kidney stones   . Hydronephrosis    stage III ckd  . Hyperlipemia   . Hypertension   . Hypothyroidism   . Lower extremity edema    comes and goes  . Osteoarthritis   . Osteoarthritis   . Osteoporosis   . Pulmonary nodule    has been followed for years, not cancerous. causes breathing issues  . Retroperitoneal fibrosis   . Sleep apnea    CPAP  . Ureter injury   . Ureteropelvic junction (UPJ) obstruction    requires change of ureteral stent approximately every 2 to 3  months    Surgical History: Past Surgical History:  Procedure Laterality Date  . ABDOMINAL HYSTERECTOMY     partial  . ANKLE FRACTURE SURGERY Left    pins in ankle but not in correct location  . APPENDECTOMY  1965  . BREAST SURGERY Bilateral 1990   Reduction  . c setion    . CARPAL TUNNEL RELEASE Bilateral 2000  . CESAREAN SECTION  3762,8315, 1969   x 3  . CYSTOSCOPY W/ RETROGRADES Right 11/02/2015   Procedure: CYSTOSCOPY WITH RETROGRADE PYELOGRAM, URETERAL STENT EXCHANGE;  Surgeon: [name], MD;  Location: [facility];  Service: Urology;  Laterality: Right;  . CYSTOSCOPY W/ RETROGRADES Right 01/31/2016   Procedure: CYSTOSCOPY WITH RETROGRADE PYELOGRAM;  Surgeon: [name], MD;  Location: [facility];  Service: Urology;  Laterality: Right;  . CYSTOSCOPY W/ RETROGRADES Bilateral 05/09/2016   Procedure: CYSTOSCOPY WITH RETROGRADE PYELOGRAM;  Surgeon: [name], MD;  Location: [facility];  Service: Urology;  Laterality: Bilateral;  . CYSTOSCOPY W/ RETROGRADES Left 11/21/2016   Procedure: CYSTOSCOPY WITH RETROGRADE PYELOGRAM;  Surgeon: [name], MD;  Location: [facility];  Service: Urology;  Laterality: Left;  . CYSTOSCOPY W/ RETROGRADES Right 03/27/2017   Procedure: CYSTOSCOPY WITH RETROGRADE PYELOGRAM;  Surgeon: [name], MD;  Location: [facility];  Service: Urology;  Laterality: Right;  . CYSTOSCOPY W/ RETROGRADES Bilateral 05/31/2017   Procedure: CYSTOSCOPY WITH RETROGRADE PYELOGRAM;  Surgeon: [name], MD;  Location: [facility];  Service: Urology;  Laterality: Bilateral;  . CYSTOSCOPY W/ RETROGRADES Right 07/22/2018   Procedure: CYSTOSCOPY WITH RETROGRADE PYELOGRAM;  Surgeon: Lori Sons, MD;  Location: [facility];  Service: Urology;  Laterality: Right;  . CYSTOSCOPY W/ URETERAL STENT PLACEMENT Right 05/11/2015   Procedure: CYSTOSCOPY WITH STENT REPLACEMENT;  Surgeon: [name], MD;  Location: [facility];  Service: Urology;   Laterality: Right;  . CYSTOSCOPY W/ URETERAL STENT PLACEMENT Right 01/31/2016   Procedure: CYSTOSCOPY, RETROGRADE PYELOGRAMS WITH STENT REPLACEMENT;  Surgeon: [name], MD;  Location: [facility];  Service: Urology;  Laterality: Right;  . CYSTOSCOPY W/ URETERAL STENT PLACEMENT Right 05/09/2016   Procedure: CYSTOSCOPY WITH STENT REPLACEMENT;  Surgeon: [name], MD;  Location: [facility];  Service: Urology;  Laterality: Right;  . CYSTOSCOPY W/ URETERAL STENT PLACEMENT Bilateral 07/04/2016   Procedure: CYSTOSCOPY WITH STENT REPLACEMENT;  Surgeon: [name], MD;  Location: [facility];  Service: Urology;  Laterality: Bilateral;  . CYSTOSCOPY W/ URETERAL STENT PLACEMENT Right 09/14/2016   Procedure: CYSTOSCOPY WITH STENT REPLACEMENT;  Surgeon: [name], MD;  Location: [facility];  Service: Urology;  Laterality: Right;  . CYSTOSCOPY W/ URETERAL STENT PLACEMENT Right 11/21/2016   Procedure: CYSTOSCOPY WITH STENT REPLACEMENT;  Surgeon: [name], MD;  Location: [facility];  Service: Urology;  Laterality: Right;  . CYSTOSCOPY W/ URETERAL STENT PLACEMENT Right 01/25/2017   Procedure: CYSTOSCOPY WITH STENT REPLACEMENT;  Surgeon: [name], MD;  Location: [facility];  Service: Urology;  Laterality: Right;  . CYSTOSCOPY W/ URETERAL STENT PLACEMENT Right 03/27/2017   Procedure: CYSTOSCOPY WITH STENT REPLACEMENT;  Surgeon: [name], MD;  Location: [facility];  Service: Urology;  Laterality: Right;  . CYSTOSCOPY W/ URETERAL STENT PLACEMENT Right 05/31/2017   Procedure: CYSTOSCOPY WITH STENT REPLACEMENT;  Surgeon: [name], MD;  Location: [facility];  Service: Urology;  Laterality: Right;  . CYSTOSCOPY W/ URETERAL STENT PLACEMENT Right 08/02/2017   Procedure: CYSTOSCOPY WITH STENT REPLACEMENT;  Surgeon: [name], MD;  Location: [facility];  Service: Urology;  Laterality: Right;  . CYSTOSCOPY W/ URETERAL STENT PLACEMENT Right 10/29/2017   Procedure:  CYSTOSCOPY WITH STENT REPLACEMENT;  Surgeon: Lori Sons, MD;  Location: [facility];  Service: Urology;  Laterality: Right;  . CYSTOSCOPY W/ URETERAL STENT PLACEMENT Right 02/14/2018   Procedure: CYSTOSCOPY WITH RETROGRADE PYELOGRAM/URETERAL STENT Exchange;  Surgeon: Lori Sons, MD;  Location: [facility];  Service: Urology;  Laterality: Right;  . CYSTOSCOPY W/ URETERAL STENT REMOVAL Left 09/14/2016   Procedure: CYSTOSCOPY WITH STENT REMOVAL;  Surgeon: [name], MD;  Location: [facility];  Service: Urology;  Laterality: Left;  . CYSTOSCOPY WITH STENT PLACEMENT Left 05/09/2016   Procedure: CYSTOSCOPY WITH STENT PLACEMENT;  Surgeon: [name], MD;  Location: [facility];  Service: Urology;  Laterality: Left;  . CYSTOSCOPY WITH STENT PLACEMENT Right 07/22/2018   Procedure: CYSTOSCOPY WITH STENT Exchange;  Surgeon: Lori Sons, MD;  Location: [facility];  Service: Urology;  Laterality: Right;  . EYE SURGERY Left 2018   tear duct reconstruction  . HERNIA REPAIR  6203   umbilical  . JOINT REPLACEMENT Left 2013   TKR  . JOINT REPLACEMENT Right 2011   TKR  . LAPAROSCOPIC HYSTERECTOMY    . REDUCTION MAMMAPLASTY    .  REPLACEMENT TOTAL KNEE Bilateral   . SKIN CANCER EXCISION Left 2018   lower eye lid  . URETEROSCOPY Bilateral 05/09/2016   Procedure: URETEROSCOPY;  Surgeon: [name], MD;  Location: [facility];  Service: Urology;  Laterality: Bilateral;  . URETEROSCOPY WITH HOLMIUM LASER LITHOTRIPSY Left 07/04/2016   Procedure: URETEROSCOPY WITH HOLMIUM LASER LITHOTRIPSY;  Surgeon: [name], MD;  Location: [facility];  Service: Urology;  Laterality: Left;    Home Medications:  Allergies as of 03/10/2019      Reactions   Ibuprofen Itching, Nausea Only, Rash      Medication List       Accurate as of March 10, 2019  9:53 AM. If you have any questions, ask your nurse or doctor.        STOP taking these medications   clobetasol cream 0.05 % Commonly known as:  TEMOVATE Stopped by: Lori Sons, MD     TAKE these medications   albuterol 108 (90 Base) MCG/ACT inhaler Commonly known as: VENTOLIN HFA Inhale 2 puffs into the lungs every 4 (four) hours as needed for wheezing or shortness of breath.   alendronate 70 MG tablet Commonly known as: FOSAMAX Take 70 mg by mouth every Friday.   Anoro Ellipta 62.5-25 MCG/INH Aepb Generic drug: umeclidinium-vilanterol Inhale 1 puff into the lungs daily.   aspirin EC 81 MG tablet Take 81 mg by mouth daily.   atorvastatin 40 MG tablet Commonly known as: LIPITOR Take 40 mg by mouth daily.   buPROPion 300 MG 24 hr tablet Commonly known as: Wellbutrin XL Take 1 tablet (300 mg total) by mouth daily.   furosemide 20 MG tablet Commonly known as: LASIX Take 20 mg by mouth daily.   gabapentin 100 MG capsule Commonly known as: NEURONTIN Take 100 mg by mouth 2 (two) times daily.   insulin lispro 100 UNIT/ML KiwkPen Commonly known as: HUMALOG Inject 12 Units into the skin 3 (three) times daily before meals.   Lantus SoloStar 100 UNIT/ML Solostar Pen Generic drug: Insulin Glargine Inject 35 Units into the skin at bedtime.   levothyroxine 75 MCG tablet Commonly known as: SYNTHROID Take 75 mcg by mouth daily before breakfast.   metFORMIN 1000 MG tablet Commonly known as: GLUCOPHAGE Take 1,000 mg by mouth 2 (two) times daily with a meal.   OneTouch Ultra test strip Generic drug: glucose blood   pantoprazole 40 MG tablet Commonly known as: PROTONIX Take 40 mg by mouth daily.   QUEtiapine 25 MG tablet Commonly known as: SEROQUEL Take 1 tablet (25 mg total) by mouth at bedtime.   telmisartan 40 MG tablet Commonly known as: MICARDIS Take 40 mg by mouth daily.   vitamin B-12 1000 MCG tablet Commonly known as: CYANOCOBALAMIN Take 1,000 mcg by mouth daily.       Allergies:  Allergies  Allergen Reactions  . Ibuprofen Itching, Nausea Only and Rash    Family History: Family History   Problem Relation Age of Onset  . Liver cancer Mother   . Colon cancer Mother   . Breast cancer Mother 91  . Diabetes Daughter   . Kidney disease Daughter        adrenal tumors  . Breast cancer Maternal Aunt   . Kidney cancer Neg Hx   . Prostate cancer Neg Hx     Social History:  reports that she has never smoked. She has never used smokeless tobacco. She reports that she does not drink alcohol or use drugs.  ROS: UROLOGY Frequent Urination?: No Hard to postpone urination?: No Burning/pain with urination?: No Get up at night to urinate?: Yes Leakage of urine?: No Urine stream starts and stops?: No Trouble starting stream?: No Do you have to strain to urinate?: No Blood in urine?: No Urinary tract infection?: No Sexually transmitted disease?: No Injury to kidneys or bladder?: No Painful intercourse?: No Weak stream?: No Currently pregnant?: No Vaginal bleeding?: No Last menstrual period?: Postmenopausal  Gastrointestinal Nausea?: No Vomiting?: No Indigestion/heartburn?: No Diarrhea?: No Constipation?: No  Constitutional Fever: No Night sweats?: No Weight loss?: No Fatigue?: No  Skin Skin rash/lesions?: No Itching?: No  Eyes Blurred vision?: No Double vision?: No  Ears/Nose/Throat Sore throat?: No Sinus problems?: No  Hematologic/Lymphatic Swollen glands?: No Easy bruising?: No  Cardiovascular Leg swelling?: No Chest pain?: No  Respiratory Cough?: Yes Shortness of breath?: Yes  Endocrine Excessive thirst?: No  Musculoskeletal Back pain?: No Joint pain?: No  Neurological Headaches?: No Dizziness?: No  Psychologic Depression?: Yes Anxiety?: Yes  Physical Exam: BP (!) 147/75 (BP Location: Left Arm, Patient Position: Sitting, Cuff Size: Normal)   Pulse (!) 101   Ht 4\' 8"  (1.422 m)   Wt 156 lb 1.6 oz (70.8 kg)   BMI 35.00 kg/m   Constitutional:  Alert and oriented, No acute distress. HEENT: [place] AT, moist mucus membranes.  Trachea  midline, no masses. Cardiovascular: No clubbing, cyanosis, or edema.  RRR Respiratory: Normal respiratory effort, no increased work of breathing.  Clear GI: Abdomen is soft, nontender, nondistended, no abdominal masses GU: No CVA tenderness Lymph: No cervical or inguinal lymphadenopathy. Skin: No rashes, bruises or suspicious lesions. Neurologic: Grossly intact, no focal deficits, moving all 4 extremities. Psychiatric: Normal mood and affect.   Assessment & Plan:   Chronic right UPJ obstruction managed with periodic stent exchange.  Her pulmonologist has recommended no further general anesthesia due to her lung disease.  I discussed the option of a percutaneous nephrostomy tube however they do not desire an external drainage tube.  We also discussed stent exchange under sedation which is certainly feasible.  They would like to attempt stent exchange under sedation.  The procedure was cussed details clean potential risks of bleeding, infection and ureteral injury.  They indicated all questions were answered and desires to schedule.  Lori Blanchard, [place] [address] [place], [place] [address] [phone]

## 2019-03-10 NOTE — H&P (View-Only) (Signed)
03/10/2019 9:53 AM   Lori Blanchard 1946-04-07 [id number]  Referring provider: [name], MD [facility] [place],  [place] [address]  Chief Complaint  Patient presents with  . Follow-up    HPI: 73 y.o. 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  Her last stent exchange was December 2019.  She has COPD and interstitial lung disease and her daughter states after her last stent exchange she required home oxygen and that her pulmonologist recommended no further general anesthesia for stent exchanges.  They present today to discuss options.  She is currently off O2 unless she is having increased activity.  She has intermittent right flank pain.  She was also recently diagnosed with lung cancer and has completed radiation.  PMH: Past Medical History:  Diagnosis Date  . Alzheimer disease ([place])   . Anxiety   . Anxiety and depression   . Asthma   . Cancer ([place])    Basal Cell Skin Cancer  . CHF (congestive heart failure) ([place])    patient denies this diagnosis  . COPD (chronic obstructive pulmonary disease) ([place])   . Depression   . Diabetes ([place])   . Diverticulosis    patient denies problems for this  . Dyspnea   . Elevated lipids   . Fatty liver   . GERD (gastroesophageal reflux disease)   . Gout    no medication at this time  . History of kidney stones   . Hydronephrosis    stage III ckd  . Hyperlipemia   . Hypertension   . Hypothyroidism   . Lower extremity edema    comes and goes  . Osteoarthritis   . Osteoarthritis   . Osteoporosis   . Pulmonary nodule    has been followed for years, not cancerous. causes breathing issues  . Retroperitoneal fibrosis   . Sleep apnea    CPAP  . Ureter injury   . Ureteropelvic junction (UPJ) obstruction    requires change of ureteral stent approximately every 2 to 3  months    Surgical History: Past Surgical History:  Procedure Laterality Date  . ABDOMINAL HYSTERECTOMY     partial  . ANKLE FRACTURE SURGERY Left    pins in ankle but not in correct location  . APPENDECTOMY  1965  . BREAST SURGERY Bilateral 1990   Reduction  . c setion    . CARPAL TUNNEL RELEASE Bilateral 2000  . CESAREAN SECTION  0814,4818, 1969   x 3  . CYSTOSCOPY W/ RETROGRADES Right 11/02/2015   Procedure: CYSTOSCOPY WITH RETROGRADE PYELOGRAM, URETERAL STENT EXCHANGE;  Surgeon: [name], MD;  Location: [facility];  Service: Urology;  Laterality: Right;  . CYSTOSCOPY W/ RETROGRADES Right 01/31/2016   Procedure: CYSTOSCOPY WITH RETROGRADE PYELOGRAM;  Surgeon: [name], MD;  Location: [facility];  Service: Urology;  Laterality: Right;  . CYSTOSCOPY W/ RETROGRADES Bilateral 05/09/2016   Procedure: CYSTOSCOPY WITH RETROGRADE PYELOGRAM;  Surgeon: [name], MD;  Location: [facility];  Service: Urology;  Laterality: Bilateral;  . CYSTOSCOPY W/ RETROGRADES Left 11/21/2016   Procedure: CYSTOSCOPY WITH RETROGRADE PYELOGRAM;  Surgeon: [name], MD;  Location: [facility];  Service: Urology;  Laterality: Left;  . CYSTOSCOPY W/ RETROGRADES Right 03/27/2017   Procedure: CYSTOSCOPY WITH RETROGRADE PYELOGRAM;  Surgeon: [name], MD;  Location: [facility];  Service: Urology;  Laterality: Right;  . CYSTOSCOPY W/ RETROGRADES Bilateral 05/31/2017   Procedure: CYSTOSCOPY WITH RETROGRADE PYELOGRAM;  Surgeon: [name], MD;  Location: [facility];  Service: Urology;  Laterality: Bilateral;  . CYSTOSCOPY W/ RETROGRADES Right 07/22/2018   Procedure: CYSTOSCOPY WITH RETROGRADE PYELOGRAM;  Surgeon: Abbie Sons, MD;  Location: [facility];  Service: Urology;  Laterality: Right;  . CYSTOSCOPY W/ URETERAL STENT PLACEMENT Right 05/11/2015   Procedure: CYSTOSCOPY WITH STENT REPLACEMENT;  Surgeon: [name], MD;  Location: [facility];  Service: Urology;   Laterality: Right;  . CYSTOSCOPY W/ URETERAL STENT PLACEMENT Right 01/31/2016   Procedure: CYSTOSCOPY, RETROGRADE PYELOGRAMS WITH STENT REPLACEMENT;  Surgeon: [name], MD;  Location: [facility];  Service: Urology;  Laterality: Right;  . CYSTOSCOPY W/ URETERAL STENT PLACEMENT Right 05/09/2016   Procedure: CYSTOSCOPY WITH STENT REPLACEMENT;  Surgeon: [name], MD;  Location: [facility];  Service: Urology;  Laterality: Right;  . CYSTOSCOPY W/ URETERAL STENT PLACEMENT Bilateral 07/04/2016   Procedure: CYSTOSCOPY WITH STENT REPLACEMENT;  Surgeon: [name], MD;  Location: [facility];  Service: Urology;  Laterality: Bilateral;  . CYSTOSCOPY W/ URETERAL STENT PLACEMENT Right 09/14/2016   Procedure: CYSTOSCOPY WITH STENT REPLACEMENT;  Surgeon: [name], MD;  Location: [facility];  Service: Urology;  Laterality: Right;  . CYSTOSCOPY W/ URETERAL STENT PLACEMENT Right 11/21/2016   Procedure: CYSTOSCOPY WITH STENT REPLACEMENT;  Surgeon: [name], MD;  Location: [facility];  Service: Urology;  Laterality: Right;  . CYSTOSCOPY W/ URETERAL STENT PLACEMENT Right 01/25/2017   Procedure: CYSTOSCOPY WITH STENT REPLACEMENT;  Surgeon: [name], MD;  Location: [facility];  Service: Urology;  Laterality: Right;  . CYSTOSCOPY W/ URETERAL STENT PLACEMENT Right 03/27/2017   Procedure: CYSTOSCOPY WITH STENT REPLACEMENT;  Surgeon: [name], MD;  Location: [facility];  Service: Urology;  Laterality: Right;  . CYSTOSCOPY W/ URETERAL STENT PLACEMENT Right 05/31/2017   Procedure: CYSTOSCOPY WITH STENT REPLACEMENT;  Surgeon: [name], MD;  Location: [facility];  Service: Urology;  Laterality: Right;  . CYSTOSCOPY W/ URETERAL STENT PLACEMENT Right 08/02/2017   Procedure: CYSTOSCOPY WITH STENT REPLACEMENT;  Surgeon: [name], MD;  Location: [facility];  Service: Urology;  Laterality: Right;  . CYSTOSCOPY W/ URETERAL STENT PLACEMENT Right 10/29/2017   Procedure:  CYSTOSCOPY WITH STENT REPLACEMENT;  Surgeon: Abbie Sons, MD;  Location: [facility];  Service: Urology;  Laterality: Right;  . CYSTOSCOPY W/ URETERAL STENT PLACEMENT Right 02/14/2018   Procedure: CYSTOSCOPY WITH RETROGRADE PYELOGRAM/URETERAL STENT Exchange;  Surgeon: Abbie Sons, MD;  Location: [facility];  Service: Urology;  Laterality: Right;  . CYSTOSCOPY W/ URETERAL STENT REMOVAL Left 09/14/2016   Procedure: CYSTOSCOPY WITH STENT REMOVAL;  Surgeon: [name], MD;  Location: [facility];  Service: Urology;  Laterality: Left;  . CYSTOSCOPY WITH STENT PLACEMENT Left 05/09/2016   Procedure: CYSTOSCOPY WITH STENT PLACEMENT;  Surgeon: [name], MD;  Location: [facility];  Service: Urology;  Laterality: Left;  . CYSTOSCOPY WITH STENT PLACEMENT Right 07/22/2018   Procedure: CYSTOSCOPY WITH STENT Exchange;  Surgeon: Abbie Sons, MD;  Location: [facility];  Service: Urology;  Laterality: Right;  . EYE SURGERY Left 2018   tear duct reconstruction  . HERNIA REPAIR  5284   umbilical  . JOINT REPLACEMENT Left 2013   TKR  . JOINT REPLACEMENT Right 2011   TKR  . LAPAROSCOPIC HYSTERECTOMY    . REDUCTION MAMMAPLASTY    .  REPLACEMENT TOTAL KNEE Bilateral   . SKIN CANCER EXCISION Left 2018   lower eye lid  . URETEROSCOPY Bilateral 05/09/2016   Procedure: URETEROSCOPY;  Surgeon: [name], MD;  Location: [facility];  Service: Urology;  Laterality: Bilateral;  . URETEROSCOPY WITH HOLMIUM LASER LITHOTRIPSY Left 07/04/2016   Procedure: URETEROSCOPY WITH HOLMIUM LASER LITHOTRIPSY;  Surgeon: [name], MD;  Location: [facility];  Service: Urology;  Laterality: Left;    Home Medications:  Allergies as of 03/10/2019      Reactions   Ibuprofen Itching, Nausea Only, Rash      Medication List       Accurate as of March 10, 2019  9:53 AM. If you have any questions, ask your nurse or doctor.        STOP taking these medications   clobetasol cream 0.05 % Commonly known as:  TEMOVATE Stopped by: Abbie Sons, MD     TAKE these medications   albuterol 108 (90 Base) MCG/ACT inhaler Commonly known as: VENTOLIN HFA Inhale 2 puffs into the lungs every 4 (four) hours as needed for wheezing or shortness of breath.   alendronate 70 MG tablet Commonly known as: FOSAMAX Take 70 mg by mouth every Friday.   Anoro Ellipta 62.5-25 MCG/INH Aepb Generic drug: umeclidinium-vilanterol Inhale 1 puff into the lungs daily.   aspirin EC 81 MG tablet Take 81 mg by mouth daily.   atorvastatin 40 MG tablet Commonly known as: LIPITOR Take 40 mg by mouth daily.   buPROPion 300 MG 24 hr tablet Commonly known as: Wellbutrin XL Take 1 tablet (300 mg total) by mouth daily.   furosemide 20 MG tablet Commonly known as: LASIX Take 20 mg by mouth daily.   gabapentin 100 MG capsule Commonly known as: NEURONTIN Take 100 mg by mouth 2 (two) times daily.   insulin lispro 100 UNIT/ML KiwkPen Commonly known as: HUMALOG Inject 12 Units into the skin 3 (three) times daily before meals.   Lantus SoloStar 100 UNIT/ML Solostar Pen Generic drug: Insulin Glargine Inject 35 Units into the skin at bedtime.   levothyroxine 75 MCG tablet Commonly known as: SYNTHROID Take 75 mcg by mouth daily before breakfast.   metFORMIN 1000 MG tablet Commonly known as: GLUCOPHAGE Take 1,000 mg by mouth 2 (two) times daily with a meal.   OneTouch Ultra test strip Generic drug: glucose blood   pantoprazole 40 MG tablet Commonly known as: PROTONIX Take 40 mg by mouth daily.   QUEtiapine 25 MG tablet Commonly known as: SEROQUEL Take 1 tablet (25 mg total) by mouth at bedtime.   telmisartan 40 MG tablet Commonly known as: MICARDIS Take 40 mg by mouth daily.   vitamin B-12 1000 MCG tablet Commonly known as: CYANOCOBALAMIN Take 1,000 mcg by mouth daily.       Allergies:  Allergies  Allergen Reactions  . Ibuprofen Itching, Nausea Only and Rash    Family History: Family History   Problem Relation Age of Onset  . Liver cancer Mother   . Colon cancer Mother   . Breast cancer Mother 36  . Diabetes Daughter   . Kidney disease Daughter        adrenal tumors  . Breast cancer Maternal Aunt   . Kidney cancer Neg Hx   . Prostate cancer Neg Hx     Social History:  reports that she has never smoked. She has never used smokeless tobacco. She reports that she does not drink alcohol or use drugs.  ROS: UROLOGY Frequent Urination?: No Hard to postpone urination?: No Burning/pain with urination?: No Get up at night to urinate?: Yes Leakage of urine?: No Urine stream starts and stops?: No Trouble starting stream?: No Do you have to strain to urinate?: No Blood in urine?: No Urinary tract infection?: No Sexually transmitted disease?: No Injury to kidneys or bladder?: No Painful intercourse?: No Weak stream?: No Currently pregnant?: No Vaginal bleeding?: No Last menstrual period?: Postmenopausal  Gastrointestinal Nausea?: No Vomiting?: No Indigestion/heartburn?: No Diarrhea?: No Constipation?: No  Constitutional Fever: No Night sweats?: No Weight loss?: No Fatigue?: No  Skin Skin rash/lesions?: No Itching?: No  Eyes Blurred vision?: No Double vision?: No  Ears/Nose/Throat Sore throat?: No Sinus problems?: No  Hematologic/Lymphatic Swollen glands?: No Easy bruising?: No  Cardiovascular Leg swelling?: No Chest pain?: No  Respiratory Cough?: Yes Shortness of breath?: Yes  Endocrine Excessive thirst?: No  Musculoskeletal Back pain?: No Joint pain?: No  Neurological Headaches?: No Dizziness?: No  Psychologic Depression?: Yes Anxiety?: Yes  Physical Exam: BP (!) 147/75 (BP Location: Left Arm, Patient Position: Sitting, Cuff Size: Normal)   Pulse (!) 101   Ht 4\' 8"  (1.422 m)   Wt 156 lb 1.6 oz (70.8 kg)   BMI 35.00 kg/m   Constitutional:  Alert and oriented, No acute distress. HEENT: [place] AT, moist mucus membranes.  Trachea  midline, no masses. Cardiovascular: No clubbing, cyanosis, or edema.  RRR Respiratory: Normal respiratory effort, no increased work of breathing.  Clear GI: Abdomen is soft, nontender, nondistended, no abdominal masses GU: No CVA tenderness Lymph: No cervical or inguinal lymphadenopathy. Skin: No rashes, bruises or suspicious lesions. Neurologic: Grossly intact, no focal deficits, moving all 4 extremities. Psychiatric: Normal mood and affect.   Assessment & Plan:   Chronic right UPJ obstruction managed with periodic stent exchange.  Her pulmonologist has recommended no further general anesthesia due to her lung disease.  I discussed the option of a percutaneous nephrostomy tube however they do not desire an external drainage tube.  We also discussed stent exchange under sedation which is certainly feasible.  They would like to attempt stent exchange under sedation.  The procedure was cussed details clean potential risks of bleeding, infection and ureteral injury.  They indicated all questions were answered and desires to schedule.  Abbie Sons, [place] [address] [place], [place] [address] [phone]

## 2019-03-12 LAB — CULTURE, URINE COMPREHENSIVE

## 2019-03-13 ENCOUNTER — Telehealth: Payer: Self-pay

## 2019-03-13 ENCOUNTER — Other Ambulatory Visit
Admission: RE | Admit: 2019-03-13 | Discharge: 2019-03-13 | Disposition: A | Discharge status: Home / Self Care | Payer: Medicare Other | Source: Ambulatory Visit | Attending: Urology | Admitting: Urology

## 2019-03-13 ENCOUNTER — Other Ambulatory Visit: Payer: Self-pay

## 2019-03-13 DIAGNOSIS — Z01812 Encounter for preprocedural laboratory examination: Secondary | ICD-10-CM | POA: Insufficient documentation

## 2019-03-13 DIAGNOSIS — Z20828 Contact with and (suspected) exposure to other viral communicable diseases: Secondary | ICD-10-CM | POA: Insufficient documentation

## 2019-03-13 NOTE — Telephone Encounter (Signed)
-----   Message from Abbie Sons, MD sent at 03/13/2019  1:10 PM EDT ----- Urine culture was negative

## 2019-03-13 NOTE — Telephone Encounter (Signed)
Left pt mess to notify per [facility]

## 2019-03-14 LAB — SARS CORONAVIRUS 2 (TAT 6-24 HRS): SARS Coronavirus 2: NEGATIVE

## 2019-03-16 ENCOUNTER — Encounter
Admission: RE | Admit: 2019-03-16 | Discharge: 2019-03-16 | Disposition: A | Discharge status: Home / Self Care | Payer: Medicare Other | Source: Ambulatory Visit | Attending: Urology | Admitting: Urology

## 2019-03-16 ENCOUNTER — Other Ambulatory Visit: Payer: Self-pay

## 2019-03-16 DIAGNOSIS — Z79899 Other long term (current) drug therapy: Secondary | ICD-10-CM | POA: Diagnosis not present

## 2019-03-16 DIAGNOSIS — K219 Gastro-esophageal reflux disease without esophagitis: Secondary | ICD-10-CM | POA: Diagnosis not present

## 2019-03-16 DIAGNOSIS — Z79890 Hormone replacement therapy (postmenopausal): Secondary | ICD-10-CM | POA: Diagnosis not present

## 2019-03-16 DIAGNOSIS — I130 Hypertensive heart and chronic kidney disease with heart failure and stage 1 through stage 4 chronic kidney disease, or unspecified chronic kidney disease: Secondary | ICD-10-CM | POA: Diagnosis not present

## 2019-03-16 DIAGNOSIS — G4730 Sleep apnea, unspecified: Secondary | ICD-10-CM | POA: Diagnosis not present

## 2019-03-16 DIAGNOSIS — F329 Major depressive disorder, single episode, unspecified: Secondary | ICD-10-CM | POA: Diagnosis not present

## 2019-03-16 DIAGNOSIS — M1990 Unspecified osteoarthritis, unspecified site: Secondary | ICD-10-CM | POA: Diagnosis not present

## 2019-03-16 DIAGNOSIS — M810 Age-related osteoporosis without current pathological fracture: Secondary | ICD-10-CM | POA: Diagnosis not present

## 2019-03-16 DIAGNOSIS — J449 Chronic obstructive pulmonary disease, unspecified: Secondary | ICD-10-CM | POA: Diagnosis not present

## 2019-03-16 DIAGNOSIS — Z87442 Personal history of urinary calculi: Secondary | ICD-10-CM | POA: Diagnosis not present

## 2019-03-16 DIAGNOSIS — I509 Heart failure, unspecified: Secondary | ICD-10-CM | POA: Diagnosis not present

## 2019-03-16 DIAGNOSIS — G309 Alzheimer's disease, unspecified: Secondary | ICD-10-CM | POA: Diagnosis not present

## 2019-03-16 DIAGNOSIS — F0280 Dementia in other diseases classified elsewhere without behavioral disturbance: Secondary | ICD-10-CM | POA: Diagnosis not present

## 2019-03-16 DIAGNOSIS — Z85828 Personal history of other malignant neoplasm of skin: Secondary | ICD-10-CM | POA: Diagnosis not present

## 2019-03-16 DIAGNOSIS — Z7982 Long term (current) use of aspirin: Secondary | ICD-10-CM | POA: Diagnosis not present

## 2019-03-16 DIAGNOSIS — Z794 Long term (current) use of insulin: Secondary | ICD-10-CM | POA: Diagnosis not present

## 2019-03-16 DIAGNOSIS — Z923 Personal history of irradiation: Secondary | ICD-10-CM | POA: Diagnosis not present

## 2019-03-16 DIAGNOSIS — Z85118 Personal history of other malignant neoplasm of bronchus and lung: Secondary | ICD-10-CM | POA: Diagnosis not present

## 2019-03-16 DIAGNOSIS — E785 Hyperlipidemia, unspecified: Secondary | ICD-10-CM | POA: Diagnosis not present

## 2019-03-16 DIAGNOSIS — N135 Crossing vessel and stricture of ureter without hydronephrosis: Secondary | ICD-10-CM | POA: Diagnosis present

## 2019-03-16 DIAGNOSIS — E1122 Type 2 diabetes mellitus with diabetic chronic kidney disease: Secondary | ICD-10-CM | POA: Diagnosis not present

## 2019-03-16 DIAGNOSIS — E039 Hypothyroidism, unspecified: Secondary | ICD-10-CM | POA: Diagnosis not present

## 2019-03-16 DIAGNOSIS — N183 Chronic kidney disease, stage 3 (moderate): Secondary | ICD-10-CM | POA: Diagnosis not present

## 2019-03-16 HISTORY — DX: Other complications of anesthesia, initial encounter: T88.59XA

## 2019-03-16 LAB — BASIC METABOLIC PANEL
Anion gap: 11 (ref 5–15)
BUN: 15 mg/dL (ref 8–23)
CO2: 26 mmol/L (ref 22–32)
Calcium: 8.6 mg/dL — ABNORMAL LOW (ref 8.9–10.3)
Chloride: 105 mmol/L (ref 98–111)
Creatinine, Ser: 1.06 mg/dL — ABNORMAL HIGH (ref 0.44–1.00)
GFR calc Af Amer: >60 mL/min (ref >60)
GFR calc non Af Amer: 52 mL/min — ABNORMAL LOW (ref >60)
Glucose, Bld: 92 mg/dL (ref 70–99)
Potassium: 3.9 mmol/L (ref 3.5–5.1)
Sodium: 142 mmol/L (ref 135–145)

## 2019-03-16 LAB — CBC
HCT: 35.8 % — ABNORMAL LOW (ref 36.0–46.0)
Hemoglobin: 10.6 g/dL — ABNORMAL LOW (ref 12.0–15.0)
MCH: 23.5 pg — ABNORMAL LOW (ref 26.0–34.0)
MCHC: 29.6 g/dL — ABNORMAL LOW (ref 30.0–36.0)
MCV: 79.2 fL — ABNORMAL LOW (ref 80.0–100.0)
Platelets: 264 10*3/uL (ref 150–400)
RBC: 4.52 MIL/uL (ref 3.87–5.11)
RDW: 16.2 % — ABNORMAL HIGH (ref 11.5–15.5)
WBC: 8.4 10*3/uL (ref 4.0–10.5)
nRBC: 0 % (ref 0.0–0.2)

## 2019-03-16 NOTE — Pre-Procedure Instructions (Signed)
CLEARANCE BY DR Raul Del MEDIUM RISK ON CHART

## 2019-03-16 NOTE — Patient Instructions (Signed)
Your procedure is scheduled on: 03/18/19 Report to Day Surgery.[facility]ICAL [facility] SECOND To find out your arrival time please call [phone] between 1PM - 3PM on  03/17/19.  Remember: Instructions that are not followed completely may result in serious medical risk,  up to and including death, or upon the discretion of your surgeon and anesthesiologist your  surgery may need to be rescheduled.     _X__ 1. Do not eat food after midnight the night before your procedure.                 No gum chewing or hard candies. You may drink clear liquids up to 2 hours                 before you are scheduled to arrive for your surgery- DO not drink clear                 liquids within 2 hours of the start of your surgery.                 Clear Liquids include:  water, apple juice without pulp, clear carbohydrate                 drink such as Clearfast of Gatorade, Black Coffee or Tea (Do not add                 anything to coffee or tea).  __X__2.  On the morning of surgery brush your teeth with toothpaste and water, you                may rinse your mouth with mouthwash if you wish.  Do not swallow any toothpaste of mouthwash.     _X__ 3.  No Alcohol for 24 hours before or after surgery.   _X__ 4.  Do Not Smoke or use e-cigarettes For 24 Hours Prior to Your Surgery.                 Do not use any chewable tobacco products for at least 6 hours prior to                 surgery.  ____  5.  Bring all medications with you on the day of surgery if instructed.   _X___  6.  Notify your doctor if there is any change in your medical condition      (cold, fever, infections).     Do not wear jewelry, make-up, hairpins, clips or nail polish. Do not wear lotions, powders, or perfumes. You may wear deodorant. Do not shave 48 hours prior to surgery. Men may shave face and neck. Do not bring valuables to the hospital.    [facility] is not responsible for any belongings or  valuables.  Contacts, dentures or bridgework may not be worn into surgery. Leave your suitcase in the car. After surgery it may be brought to your room. For patients admitted to the hospital, discharge time is determined by your treatment team.   Patients discharged the day of surgery will not be allowed to drive home.             X____ Take these medicines the morning of surgery with A SIP OF WATER:    1. TELMISARTAN  2. LEVOTHYROXINE  3. WELLBUTRIN  4. GABAPENTIN  5.  PANTORAZOLE  6.  ____ Fleet Enema (as directed)   ____ Use CHG Soap as directed  _X_ Use inhalers on the day of  surgery  AND BRING  __X__ Stop metformin 2 days prior to surgery    __X__ Take 1/2 of usual insulin dose the night before surgery. No insulin the morning          of surgery.   __X__ Stop Coumadin/Plavix/aspirin on  STOP ASPIRIN TODAY  ____ Stop Anti-inflammatories on    ____ Stop supplements until after surgery.    ___X_ Bring C-Pap to the hospital.

## 2019-03-17 MED ORDER — CEFAZOLIN SODIUM-DEXTROSE 2-4 GM/100ML-% IV SOLN
2.0000 g | INTRAVENOUS | Status: AC
  Administered 2019-03-18: 2 g via INTRAVENOUS

## 2019-03-18 ENCOUNTER — Encounter
Admission: RE | Disposition: A | Discharge status: Home / Self Care | Payer: Self-pay | Source: Home / Self Care | Attending: Urology

## 2019-03-18 ENCOUNTER — Ambulatory Visit: Payer: Medicare Other | Admitting: Anesthesiology

## 2019-03-18 ENCOUNTER — Ambulatory Visit
Admission: RE | Admit: 2019-03-18 | Discharge: 2019-03-18 | Disposition: A | Discharge status: Home / Self Care | Payer: Medicare Other | Attending: Urology | Admitting: Urology

## 2019-03-18 ENCOUNTER — Other Ambulatory Visit: Payer: Self-pay

## 2019-03-18 ENCOUNTER — Encounter: Payer: Self-pay | Admitting: Emergency Medicine

## 2019-03-18 DIAGNOSIS — M1990 Unspecified osteoarthritis, unspecified site: Secondary | ICD-10-CM | POA: Insufficient documentation

## 2019-03-18 DIAGNOSIS — I130 Hypertensive heart and chronic kidney disease with heart failure and stage 1 through stage 4 chronic kidney disease, or unspecified chronic kidney disease: Secondary | ICD-10-CM | POA: Insufficient documentation

## 2019-03-18 DIAGNOSIS — Z923 Personal history of irradiation: Secondary | ICD-10-CM | POA: Insufficient documentation

## 2019-03-18 DIAGNOSIS — Z85828 Personal history of other malignant neoplasm of skin: Secondary | ICD-10-CM | POA: Insufficient documentation

## 2019-03-18 DIAGNOSIS — Z79899 Other long term (current) drug therapy: Secondary | ICD-10-CM | POA: Insufficient documentation

## 2019-03-18 DIAGNOSIS — N135 Crossing vessel and stricture of ureter without hydronephrosis: Secondary | ICD-10-CM

## 2019-03-18 DIAGNOSIS — G309 Alzheimer's disease, unspecified: Secondary | ICD-10-CM | POA: Insufficient documentation

## 2019-03-18 DIAGNOSIS — F329 Major depressive disorder, single episode, unspecified: Secondary | ICD-10-CM | POA: Insufficient documentation

## 2019-03-18 DIAGNOSIS — Z794 Long term (current) use of insulin: Secondary | ICD-10-CM | POA: Insufficient documentation

## 2019-03-18 DIAGNOSIS — E785 Hyperlipidemia, unspecified: Secondary | ICD-10-CM | POA: Insufficient documentation

## 2019-03-18 DIAGNOSIS — K219 Gastro-esophageal reflux disease without esophagitis: Secondary | ICD-10-CM | POA: Insufficient documentation

## 2019-03-18 DIAGNOSIS — M810 Age-related osteoporosis without current pathological fracture: Secondary | ICD-10-CM | POA: Insufficient documentation

## 2019-03-18 DIAGNOSIS — Z85118 Personal history of other malignant neoplasm of bronchus and lung: Secondary | ICD-10-CM | POA: Insufficient documentation

## 2019-03-18 DIAGNOSIS — E1122 Type 2 diabetes mellitus with diabetic chronic kidney disease: Secondary | ICD-10-CM | POA: Insufficient documentation

## 2019-03-18 DIAGNOSIS — G4730 Sleep apnea, unspecified: Secondary | ICD-10-CM | POA: Insufficient documentation

## 2019-03-18 DIAGNOSIS — N183 Chronic kidney disease, stage 3 (moderate): Secondary | ICD-10-CM | POA: Insufficient documentation

## 2019-03-18 DIAGNOSIS — I509 Heart failure, unspecified: Secondary | ICD-10-CM | POA: Insufficient documentation

## 2019-03-18 DIAGNOSIS — E039 Hypothyroidism, unspecified: Secondary | ICD-10-CM | POA: Insufficient documentation

## 2019-03-18 DIAGNOSIS — J449 Chronic obstructive pulmonary disease, unspecified: Secondary | ICD-10-CM | POA: Insufficient documentation

## 2019-03-18 DIAGNOSIS — Z79890 Hormone replacement therapy (postmenopausal): Secondary | ICD-10-CM | POA: Insufficient documentation

## 2019-03-18 DIAGNOSIS — Z7982 Long term (current) use of aspirin: Secondary | ICD-10-CM | POA: Insufficient documentation

## 2019-03-18 DIAGNOSIS — Z87442 Personal history of urinary calculi: Secondary | ICD-10-CM | POA: Insufficient documentation

## 2019-03-18 DIAGNOSIS — F0280 Dementia in other diseases classified elsewhere without behavioral disturbance: Secondary | ICD-10-CM | POA: Insufficient documentation

## 2019-03-18 HISTORY — PX: CYSTOSCOPY W/ URETERAL STENT PLACEMENT: SHX1429

## 2019-03-18 LAB — GLUCOSE, CAPILLARY
Glucose-Capillary: 131 mg/dL — ABNORMAL HIGH (ref 70–99)
Glucose-Capillary: 164 mg/dL — ABNORMAL HIGH (ref 70–99)

## 2019-03-18 SURGERY — CYSTOSCOPY, FLEXIBLE, WITH STENT REPLACEMENT
Anesthesia: General | Site: Ureter | Laterality: Right

## 2019-03-18 MED ORDER — HYDROCODONE-ACETAMINOPHEN 5-325 MG PO TABS: 0.5000 | ORAL_TABLET | Freq: Four times a day (QID) | ORAL | 0 refills | Status: DC | PRN

## 2019-03-18 MED ORDER — PROPOFOL 10 MG/ML IV BOLUS
INTRAVENOUS | Status: AC
  Filled 2019-03-18: qty 20

## 2019-03-18 MED ORDER — PROPOFOL 10 MG/ML IV BOLUS
INTRAVENOUS | Status: DC | PRN
  Administered 2019-03-18: 50 mg via INTRAVENOUS

## 2019-03-18 MED ORDER — PROPOFOL 500 MG/50ML IV EMUL
INTRAVENOUS | Status: DC | PRN
  Administered 2019-03-18: 75 ug/kg/min via INTRAVENOUS

## 2019-03-18 MED ORDER — FENTANYL CITRATE (PF) 100 MCG/2ML IJ SOLN
INTRAMUSCULAR | Status: AC
  Filled 2019-03-18: qty 2

## 2019-03-18 MED ORDER — ONDANSETRON HCL 4 MG/2ML IJ SOLN
INTRAMUSCULAR | Status: AC
  Filled 2019-03-18: qty 2

## 2019-03-18 MED ORDER — LIDOCAINE HCL (CARDIAC) PF 100 MG/5ML IV SOSY
PREFILLED_SYRINGE | INTRAVENOUS | Status: DC | PRN
  Administered 2019-03-18: 50 mg via INTRAVENOUS

## 2019-03-18 MED ORDER — LIDOCAINE HCL (PF) 2 % IJ SOLN
INTRAMUSCULAR | Status: AC
  Filled 2019-03-18: qty 10

## 2019-03-18 MED ORDER — ONDANSETRON HCL 4 MG/2ML IJ SOLN
INTRAMUSCULAR | Status: DC | PRN
  Administered 2019-03-18: 4 mg via INTRAVENOUS

## 2019-03-18 MED ORDER — DEXMEDETOMIDINE HCL 200 MCG/2ML IV SOLN
INTRAVENOUS | Status: DC | PRN
  Administered 2019-03-18: 12 ug via INTRAVENOUS

## 2019-03-18 MED ORDER — SODIUM CHLORIDE 0.9 % IV SOLN
INTRAVENOUS | Status: DC
  Administered 2019-03-18: 07:00:00 via INTRAVENOUS

## 2019-03-18 MED ORDER — CEFAZOLIN SODIUM-DEXTROSE 2-4 GM/100ML-% IV SOLN
INTRAVENOUS | Status: AC
  Filled 2019-03-18: qty 100

## 2019-03-18 MED ORDER — FENTANYL CITRATE (PF) 100 MCG/2ML IJ SOLN: 25.0000 ug | INTRAMUSCULAR | Status: DC | PRN

## 2019-03-18 MED ORDER — DEXAMETHASONE SODIUM PHOSPHATE 10 MG/ML IJ SOLN
INTRAMUSCULAR | Status: AC
  Filled 2019-03-18: qty 1

## 2019-03-18 MED ORDER — ONDANSETRON HCL 4 MG/2ML IJ SOLN: 4.0000 mg | Freq: Once | INTRAMUSCULAR | Status: DC | PRN

## 2019-03-18 MED ORDER — SUCCINYLCHOLINE CHLORIDE 20 MG/ML IJ SOLN
INTRAMUSCULAR | Status: AC
  Filled 2019-03-18: qty 1

## 2019-03-18 SURGICAL SUPPLY — 19 items
BAG DRAIN CYSTO-URO LG1000N (MISCELLANEOUS) ×3 IMPLANT
BRUSH SCRUB EZ  4% CHG (MISCELLANEOUS)
BRUSH SCRUB EZ 4% CHG (MISCELLANEOUS) IMPLANT
CATH URETL 5X70 OPEN END (CATHETERS) IMPLANT
GLOVE BIO SURGEON STRL SZ8 (GLOVE) ×3 IMPLANT
GOWN STRL REUS W/ TWL XL LVL3 (GOWN DISPOSABLE) ×1 IMPLANT
GOWN STRL REUS W/TWL XL LVL3 (GOWN DISPOSABLE) ×3
GUIDEWIRE STR DUAL SENSOR (WIRE) ×3 IMPLANT
KIT TURNOVER CYSTO (KITS) ×3 IMPLANT
PACK CYSTO AR (MISCELLANEOUS) ×3 IMPLANT
SET CYSTO W/LG BORE CLAMP LF (SET/KITS/TRAYS/PACK) ×3 IMPLANT
SOL .9 NS 3000ML IRR  AL (IV SOLUTION) ×2
SOL .9 NS 3000ML IRR AL (IV SOLUTION) ×1
SOL .9 NS 3000ML IRR UROMATIC (IV SOLUTION) ×1 IMPLANT
STENT URET 6FRX24 CONTOUR (STENTS) IMPLANT
STENT URET 6FRX26 CONTOUR (STENTS) IMPLANT
STENT URO INLAY 6FRX22CM (STENTS) ×3 IMPLANT
SURGILUBE 2OZ TUBE FLIPTOP (MISCELLANEOUS) ×3 IMPLANT
WATER STERILE IRR 1000ML POUR (IV SOLUTION) ×3 IMPLANT

## 2019-03-18 NOTE — Transfer of Care (Signed)
Immediate Anesthesia Transfer of Care Note  Patient: Alonna Minium Labrie  Procedure(s) Performed: CYSTOSCOPY WITH STENT REPLACEMENT (Right Ureter)  Patient Location: PACU  Anesthesia Type:General  Level of Consciousness: drowsy and patient cooperative  Airway & Oxygen Therapy: Patient Spontanous Breathing and Patient connected to face mask oxygen  Post-op Assessment: Report given to RN and Post -op Vital signs reviewed and stable  Post vital signs: Reviewed and stable  Last Vitals:  Vitals Value Taken Time  BP 120/60 03/18/19 0813  Temp    Pulse 89 03/18/19 0814  Resp 23 03/18/19 0814  SpO2 100 % 03/18/19 0814  Vitals shown include unvalidated device data.  Last Pain:  Vitals:   03/18/19 0616  TempSrc: Temporal  PainSc: 1          Complications: No apparent anesthesia complications

## 2019-03-18 NOTE — Anesthesia Preprocedure Evaluation (Signed)
Anesthesia Evaluation  Patient identified by MRN, date of birth, ID band Patient awake    Reviewed: Allergy & Precautions, H&P , NPO status , Patient's Chart, lab work & pertinent test results, reviewed documented beta blocker date and time   History of Anesthesia Complications (+) history of anesthetic complications  Airway Mallampati: II  TM Distance: >3 FB Neck ROM: full    Dental  (+) Teeth Intact   Pulmonary shortness of breath and with exertion, asthma , sleep apnea , COPD,  COPD inhaler,    Pulmonary exam normal        Cardiovascular Exercise Tolerance: Poor hypertension, On Medications +CHF  Normal cardiovascular exam Rate:Normal     Neuro/Psych Anxiety Depression Dementia negative neurological ROS  negative psych ROS   GI/Hepatic Neg liver ROS, GERD  Medicated,  Endo/Other  diabetesHypothyroidism   Renal/GU Renal disease  negative genitourinary   Musculoskeletal   Abdominal   Peds  Hematology  (+) Blood dyscrasia, anemia ,   Anesthesia Other Findings   Reproductive/Obstetrics negative OB ROS                             Anesthesia Physical Anesthesia Plan  ASA: III  Anesthesia Plan: General LMA   Post-op Pain Management:    Induction:   PONV Risk Score and Plan:   Airway Management Planned:   Additional Equipment:   Intra-op Plan:   Post-operative Plan:   Informed Consent: I have reviewed the patients History and Physical, chart, labs and discussed the procedure including the risks, benefits and alternatives for the proposed anesthesia with the patient or authorized representative who has indicated his/her understanding and acceptance.       Plan Discussed with: CRNA  Anesthesia Plan Comments:         Anesthesia Quick Evaluation

## 2019-03-18 NOTE — Interval H&P Note (Signed)
History and Physical Interval Note:  03/18/2019 7:25 AM  Lori Blanchard  has presented today for surgery, with the diagnosis of right UPJ OBSTRUCTION.  The various methods of treatment have been discussed with the patient and family. After consideration of risks, benefits and other options for treatment, the patient has consented to  Procedure(s): CYSTOSCOPY WITH STENT REPLACEMENT (Right) as a surgical intervention.  The patient's history has been reviewed, patient examined, no change in status, stable for surgery.  I have reviewed the patient's chart and labs.  Questions were answered to the patient's satisfaction.     [name]

## 2019-03-18 NOTE — Op Note (Signed)
Preoperative diagnosis:  1. Right UPJ obstruction  Postoperative diagnosis:  1. Right UPJ obstruction  Procedure: 1. Cystoscopy with exchange right ureteral stent  Surgeon: Abbie Sons, MD  Anesthesia: MAC  Complications: None  Intraoperative findings: No encrustation of stent noted  EBL: None  Specimens: None  Indication: Lori Blanchard is a 73 y.o. patient with symptomatic chronic right UPJ obstruction.  She was felt to be a poor surgical candidate and has been managed with ureteral stent drainage.  She presents today for stent exchange.  After reviewing the management options for treatment, he elected to proceed with the above surgical procedure(s). We have discussed the potential benefits and risks of the procedure, side effects of the proposed treatment, the likelihood of the patient achieving the goals of the procedure, and any potential problems that might occur during the procedure or recuperation. Informed consent has been obtained.  Description of procedure:  The patient was taken to the operating room and general anesthesia was induced.  The patient was placed in the dorsal lithotomy position, prepped and draped in the usual sterile fashion, and preoperative antibiotics were administered. A preoperative time-out was performed.   A 21 French cystoscope was lubricated and passed per urethra.  Panendoscopy was performed and the bladder mucosa was normal in appearance without erythema, solid or papillary lesions.  The stent was grasped with endoscopic forceps and brought out through the urethral meatus.  No encrustation was noted.  A 0.038 sensor wire was easily placed through the stent and advanced up to the renal pelvis.  The previous ureteral stent was removed.  A 6 French/22 cm Bard Optima ureteral stent was placed over the wire without difficulty.  There was good curl seen in the renal pelvis under fluoroscopy and the bladder under direct vision.  After anesthetic  reversal patient was transported to the PACU in stable condition.  Plan: Stent exchange in 4-6 months.   Abbie Sons, M.D.

## 2019-03-18 NOTE — Discharge Instructions (Signed)
DISCHARGE INSTRUCTIONS FOR URETERAL STENT   MEDICATIONS:  1. Resume all your other meds from home.  2.  AZO (over-the-counter) can help with the burning/stinging when you urinate. 3.  Hydrocodone is for moderate/severe pain, Rx was sent to your pharmacy.  ACTIVITY:  1. May resume regular activities in 24 hours. 2. No driving while on narcotic pain medications  3. Drink plenty of water  4. Continue to walk at home - you can still get blood clots when you are at home, so keep active, but don't over do it.  5. May return to work/school tomorrow or when you feel ready   BATHING:  1. You can shower or bathe.    SIGNS/SYMPTOMS TO CALL:  Please call us if you have a fever greater than 101.5, uncontrolled nausea/vomiting, uncontrolled pain, dizziness, unable to urinate, bloody urine, chest pain, shortness of breath, leg swelling, leg pain, or any other concerns or questions.   You can reach [place] at [phone].   FOLLOW-UP:  1. You we will be contacted regarding a follow-up appointment.  AMBULATORY SURGERY  DISCHARGE INSTRUCTIONS   1) The drugs that you were given will stay in your system until tomorrow so for the next 24 hours you should not:  A) Drive an automobile B) Make any legal decisions C) Drink any alcoholic beverage   2) You may resume regular meals tomorrow.  Today it is better to start with liquids and gradually work up to solid foods.  You may eat anything you prefer, but it is better to start with liquids, then soup and crackers, and gradually work up to solid foods.   3) Please notify your doctor immediately if you have any unusual bleeding, trouble breathing, redness and pain at the surgery site, drainage, fever, or pain not relieved by medication.    4) Additional Instructions:        Please contact your physician with any problems or Same Day Surgery at [phone], Monday through Friday 6 am to 4 pm, or [place] at [facility] number at  [phone].

## 2019-03-18 NOTE — Anesthesia Post-op Follow-up Note (Signed)
Anesthesia QCDR form completed.        

## 2019-03-19 NOTE — Anesthesia Postprocedure Evaluation (Signed)
Anesthesia Post Note  Patient: Lori Blanchard  Procedure(s) Performed: CYSTOSCOPY WITH STENT REPLACEMENT (Right Ureter)  Patient location during evaluation: PACU Anesthesia Type: General Level of consciousness: awake and alert Pain management: pain level controlled Vital Signs Assessment: post-procedure vital signs reviewed and stable Respiratory status: spontaneous breathing, nonlabored ventilation, respiratory function stable and patient connected to nasal cannula oxygen Cardiovascular status: blood pressure returned to baseline and stable Postop Assessment: no apparent nausea or vomiting Anesthetic complications: no     Last Vitals:  Vitals:   03/18/19 0853 03/18/19 0920  BP: (!) 144/97   Pulse: 87   Resp: 16   Temp: 37.1 C   SpO2: 94% 96%    Last Pain:  Vitals:   03/18/19 0853  TempSrc: Temporal  PainSc: 0-No pain                 Molli Barrows

## 2019-04-03 ENCOUNTER — Ambulatory Visit
Admission: RE | Admit: 2019-04-03 | Discharge: 2019-04-03 | Disposition: A | Discharge status: Home / Self Care | Payer: Medicare Other | Source: Ambulatory Visit | Attending: Radiation Oncology | Admitting: Radiation Oncology

## 2019-04-03 ENCOUNTER — Other Ambulatory Visit: Payer: Self-pay

## 2019-04-03 ENCOUNTER — Other Ambulatory Visit: Payer: Self-pay | Admitting: *Deleted

## 2019-04-03 DIAGNOSIS — R918 Other nonspecific abnormal finding of lung field: Secondary | ICD-10-CM | POA: Diagnosis present

## 2019-04-03 DIAGNOSIS — Z923 Personal history of irradiation: Secondary | ICD-10-CM | POA: Insufficient documentation

## 2019-04-03 NOTE — Progress Notes (Signed)
Radiation Oncology Follow up Note  Name: Lori Blanchard   Date:   04/03/2019 MRN:  [id number] DOB: 1946-06-05    This 73 y.o. female presents to the clinic today for 1 month follow-up status post SBRT to her right lower lobe for presumed non-small cell lung cancer.  REFERRING PROVIDER: [name], MD  HPI: Patient is a 73 year old female now at 1 month having completed SBRT to her right lower lobe for presumed non-small cell lung cancer hypermetabolic on PET CT scan and progressing.  Seen today in routine follow-up she is doing well she is without complaint.  She specifically denies cough hemoptysis or chest tightness..  COMPLICATIONS OF TREATMENT: none  FOLLOW UP COMPLIANCE: keeps appointments   PHYSICAL EXAM:  BP (!) (P) 158/79 (BP Location: Left Arm, Patient Position: Sitting)   Pulse (P) 88   Temp (!) (P) 97.3 F (36.3 C) (Tympanic)   Resp (P) 16   Wt (P) 154 lb 4.8 oz (70 kg)   BMI (P) 34.59 kg/m  Well-developed well-nourished patient in NAD. HEENT reveals PERLA, EOMI, discs not visualized.  Oral cavity is clear. No oral mucosal lesions are identified. Neck is clear without evidence of cervical or supraclavicular adenopathy. Lungs are clear to A&P. Cardiac examination is essentially unremarkable with regular rate and rhythm without murmur rub or thrill. Abdomen is benign with no organomegaly or masses noted. Motor sensory and DTR levels are equal and symmetric in the upper and lower extremities. Cranial nerves II through XII are grossly intact. Proprioception is intact. No peripheral adenopathy or edema is identified. No motor or sensory levels are noted. Crude visual fields are within normal range.  RADIOLOGY RESULTS: No current films for review  PLAN: Present time patient is done well with no side effects from her SBRT.  I am pleased with her overall progress.  I have ordered a CT scan of the chest in 3 months and a follow-up shortly thereafter.  Patient knows to call  with any concerns.  I have personally discussed her case and my recommendations with her daughter.  I would like to take this opportunity to thank you for allowing me to participate in the care of your patient.Noreene Filbert, MD

## 2019-05-04 ENCOUNTER — Ambulatory Visit: Payer: Medicare Other | Admitting: Neurology

## 2019-05-04 ENCOUNTER — Encounter: Payer: Self-pay | Admitting: Neurology

## 2019-05-04 ENCOUNTER — Other Ambulatory Visit: Payer: Self-pay

## 2019-05-04 VITALS — BP 136/70 | HR 112 | Ht <= 58 in | Wt 154.4 lb

## 2019-05-04 DIAGNOSIS — G301 Alzheimer's disease with late onset: Secondary | ICD-10-CM | POA: Diagnosis not present

## 2019-05-04 DIAGNOSIS — F0281 Dementia in other diseases classified elsewhere with behavioral disturbance: Secondary | ICD-10-CM | POA: Diagnosis not present

## 2019-05-04 MED ORDER — BUPROPION HCL ER (XL) 150 MG PO TB24: 150.0000 mg | ORAL_TABLET | Freq: Every day | ORAL | 3 refills | Status: DC

## 2019-05-04 MED ORDER — QUETIAPINE FUMARATE 25 MG PO TABS: 25.0000 mg | ORAL_TABLET | Freq: Every day | ORAL | 3 refills | Status: DC

## 2019-05-04 MED ORDER — RIVASTIGMINE TARTRATE 1.5 MG PO CAPS: 1.5000 mg | ORAL_CAPSULE | Freq: Two times a day (BID) | ORAL | 11 refills | Status: DC

## 2019-05-04 NOTE — Patient Instructions (Signed)
1. Start Rivastigmine 1.5mg  twice a day  2. The dose of Wellbutrin will be changed to 150mg  daily so you do not have to cut it in half: Take Wellbutrin XL 150mg  1 tablet daily  3. Continue Seroquel 25mg  every night  4. Recommend wearing your oxygen regularly  5. If walking becomes an issue, we can order Physical Therapy  6. Follow-up in 6 months, call for any changes  FALL PRECAUTIONS: Be cautious when walking. Scan the area for obstacles that may increase the risk of trips and falls. When getting up in the mornings, sit up at the edge of the bed for a few minutes before getting out of bed. Consider elevating the bed at the head end to avoid drop of blood pressure when getting up. Walk always in a well-lit room (use night lights in the walls). Avoid area rugs or power cords from appliances in the middle of the walkways. Use a walker or a cane if necessary and consider physical therapy for balance exercise. Get your eyesight checked regularly.  FINANCIAL OVERSIGHT: Supervision, especially oversight when making financial decisions or transactions is also recommended.  HOME SAFETY: Consider the safety of the kitchen when operating appliances like stoves, microwave oven, and blender. Consider having supervision and share cooking responsibilities until no longer able to participate in those. Accidents with firearms and other hazards in the house should be identified and addressed as well.  DRIVING: Regarding driving, in patients with progressive memory problems, driving will be impaired. We advise to have someone else do the driving if trouble finding directions or if minor accidents are reported. Independent driving assessment is available to determine safety of driving.  ABILITY TO BE LEFT ALONE: If patient is unable to contact 911 operator, consider using LifeLine, or when the need is there, arrange for someone to stay with patients. Smoking is a fire hazard, consider supervision or cessation. Risk  of wandering should be assessed by caregiver and if detected at any point, supervision and safe proof recommendations should be instituted.  MEDICATION SUPERVISION: Inability to self-administer medication needs to be constantly addressed. Implement a mechanism to ensure safe administration of the medications.  RECOMMENDATIONS FOR ALL PATIENTS WITH MEMORY PROBLEMS: 1. Continue to exercise (Recommend 30 minutes of walking everyday, or 3 hours every week) 2. Increase social interactions - continue going to [place] and enjoy social gatherings with friends and family 3. Eat healthy, avoid fried foods and eat more fruits and vegetables 4. Maintain adequate blood pressure, blood sugar, and blood cholesterol level. Reducing the risk of stroke and cardiovascular disease also helps promoting better memory. 5. Avoid stressful situations. Live a simple life and avoid aggravations. Organize your time and prepare for the next day in anticipation. 6. Sleep well, avoid any interruptions of sleep and avoid any distractions in the bedroom that may interfere with adequate sleep quality 7. Avoid sugar, avoid sweets as there is a strong link between excessive sugar intake, diabetes, and cognitive impairment The Mediterranean diet has been shown to help patients reduce the risk of progressive memory disorders and reduces cardiovascular risk. This includes eating fish, eat fruits and green leafy vegetables, nuts like almonds and hazelnuts, walnuts, and also use olive oil. Avoid fast foods and fried foods as much as possible. Avoid sweets and sugar as sugar use has been linked to worsening of memory function.  There is always a concern of gradual progression of memory problems. If this is the case, then we may need to adjust level of  care according to patient needs. Support, both to the patient and caregiver, should then be put into place.

## 2019-05-04 NOTE — Progress Notes (Signed)
NEUROLOGY FOLLOW UP OFFICE NOTE  MISTEY FIMBRES [id number] 1946-04-27  HISTORY OF PRESENT ILLNESS: I had the pleasure of seeing Lori Blanchard in follow-up in the neurology clinic on 05/04/2019.  The patient was last seen 7 months ago for dementia. She is again accompanied by her daughter Lori Blanchard who helps supplement the history today. MMSE 24/30 in February 2020. On her last visit, Wellbutrin was increased to 300mg  daily. She is also on Seroquel 25mg  qhs. She feels her memory is "allright." Lori Blanchard has noticed more decline. She did not tolerate the 300mg  dose of Wellbutrin, Lori Blanchard has been cutting it in half. She still has anxiety, but worse on the higher dose. She gets irritated and frustrated she cannot do things or remember, which causes anxiety. No further hallucinations. They think she has vivid dreams at night. She now has 24/7 care. Lori Blanchard has started managing finances, she is not good with writing checks or bills anymore. She continues to manage her medications well and Lori Blanchard checks behind her. She does not drive. She hates wearing her oxygen, but when active her sats drop to the 80s and she starts feeling dizzy. She wears her CPAP at night and sleeps well with the Seroquel. She is independent with dressing and bathing. She denies any headaches, vision changes, focal numbness/tingling/weakness, no falls. Lori Blanchard reports her walking has changed, she "walks like a penguin."   History on Initial Assessment 09/22/2018: This is a 73 year old ambidextrous right-hand dominant woman with a history of hypertension, hyperlipidemia, diabetes, hypothyroidism, presenting for second opinion regarding dementia. She has been seeing [facility] since October 2016, last visit was in July 2019. On initial visit with Dr. Manuella Ghazi in October 2016, family reported memory decline starting around 2015. She was becoming more forgetful and family started checking behind her with bill payments. They deny getting lost driving. She  had a SLUMS score of 18/30 and was started on Donepezil 5mg  daily. In March 2017, family reported auditory and visual hallucinations of things outside. She had an EEG in April 2017 which showed generalized fronto-central predominant delta slowing of the background. In November 2017, SLUMS score was 17/30, Donepezil increased to 10mg  daily which caused headaches, dizziness, and GI symptoms. Family was reporting more mood disturbances and was started on Lamotrigine for mood stabilization. This caused itching and rash. She was reporting headaches and was started on magnesium. She had an MRI brain in April 2018 which did not show any acute changes. There was note of stable cerebral volume since 2016 with no areas of disproportionate cerebral atrophy, mild to moderate chronic microvascular disease. In January 2019, family reported decline in memory and vivid dreams and hallucinations seeing her deceased son and parents. Donepezil was stopped and Seroquel 25mg  qhs started. There were no further hallucinations or vivid dreams since them.   Lori Blanchard today reports that she has not noticed significant decline since 2015. The patient states her memory is okay. She lives alone but her daughter stays with her during the day and granddaughter stays with her at night. Lori Blanchard reports she writes her own bills and family checks behind her, she had only missed one bill in the past. If she gets confused, she asks for help. She stopped driving [id number] years ago at Dr. Trena Platt recommendation. Her other daughter fixes her pillbox and she is pretty good with taking them by herself. She does not cook. Over the past 6 months, she is having more agitation and anxiety, she gets mad at herself when  she cannot remember. She would start cussing, which is unusual for her. She has been on Wellbutrin XL 150mg  daily for several years. Attempts to stop it in the past caused increased emotionality. Her daughter reports she is very negative and possibly  paranoid. Sleep is good, no wandering behavior. She gets upset when family stays with her, she "just wants to be left some time by herself," getting upset when her daughter has to go to the grocery and bring her when she just wants to stay home and do her coloring.   She denies any further significant headaches, no dizziness, diplopia, dysarthria/dysphagia, neck/back pain, focal numbness/tingling/weakness, bowel/bladder dysfunction, anosmia, or tremor. She has left shoulder arthritis. Her daughter feels she has balance issues, she nearly tripped a month ago and her daughter caught her. Her father had dementia in his 15s. No history of significant head injuries or alcohol use.   Laboratory Data: 06/10/18: TSH 2.865, 10/01/16 B12 [id number]  PAST MEDICAL HISTORY: Past Medical History:  Diagnosis Date   Alzheimer disease ([place])    Anxiety    Anxiety and depression    Asthma    Cancer ([place])    Basal Cell Skin Cancer/ LUNG/RADIATION LAST 8/20   CHF (congestive heart failure) ([place])    patient denies this diagnosis   Complication of anesthesia    2/20 oxygen and stayed overnight   COPD (chronic obstructive pulmonary disease) ([place])    Depression    Diabetes ([place]    Diverticulosis    patient denies problems for this   Dyspnea    Elevated lipids    Fatty liver    GERD (gastroesophageal reflux disease)    Gout    no medication at this time   History of kidney stones    Hydronephrosis    stage III ckd   Hyperlipemia    Hypertension    Hypothyroidism    Lower extremity edema    comes and goes   Osteoarthritis    Osteoarthritis    Osteoporosis    Pulmonary nodule    has been followed for years, not cancerous. causes breathing issues   Retroperitoneal fibrosis    Sleep apnea    CPAP   Ureter injury    Ureteropelvic junction (UPJ) obstruction    requires change of ureteral stent approximately every 2 to 3 months    MEDICATIONS: Current Outpatient Medications  on File Prior to Visit  Medication Sig Dispense Refill   albuterol (PROVENTIL HFA;VENTOLIN HFA) 108 (90 Base) MCG/ACT inhaler Inhale 2 puffs into the lungs every 4 (four) hours as needed for wheezing or shortness of breath.     alendronate (FOSAMAX) 70 MG tablet Take 70 mg by mouth every Friday.      aspirin EC 81 MG tablet Take 81 mg by mouth daily.      atorvastatin (LIPITOR) 40 MG tablet Take 40 mg by mouth daily.      buPROPion (WELLBUTRIN XL) 300 MG 24 hr tablet Take 1 tablet (300 mg total) by mouth daily. 90 tablet 3   furosemide (LASIX) 20 MG tablet Take 20 mg by mouth daily.      gabapentin (NEURONTIN) 100 MG capsule Take 100 mg by mouth 2 (two) times daily.   0   HYDROcodone-acetaminophen (NORCO/VICODIN) 5-325 MG tablet Take 0.5-1 tablets by mouth every 6 (six) hours as needed for moderate pain. 10 tablet 0   insulin lispro (HUMALOG) 100 UNIT/ML KiwkPen Inject 12 Units into the skin 3 (three) times daily before meals.  LANTUS SOLOSTAR 100 UNIT/ML Solostar Pen Inject 35 Units into the skin at bedtime.      levothyroxine (SYNTHROID, LEVOTHROID) 75 MCG tablet Take 75 mcg by mouth daily before breakfast.     metFORMIN (GLUCOPHAGE) 1000 MG tablet Take 1,000 mg by mouth 2 (two) times daily with a meal.     ONETOUCH ULTRA test strip      pantoprazole (PROTONIX) 40 MG tablet Take 40 mg by mouth daily.      QUEtiapine (SEROQUEL) 25 MG tablet Take 1 tablet (25 mg total) by mouth at bedtime. 90 tablet 3   telmisartan (MICARDIS) 40 MG tablet Take 40 mg by mouth daily.      umeclidinium-vilanterol (ANORO ELLIPTA) 62.5-25 MCG/INH AEPB Inhale 1 puff into the lungs daily.     vitamin B-12 (CYANOCOBALAMIN) 1000 MCG tablet Take 1,000 mcg by mouth daily.     No current facility-administered medications on file prior to visit.     ALLERGIES: Allergies  Allergen Reactions   Ibuprofen Itching, Nausea Only and Rash    FAMILY HISTORY: Family History  Problem Relation Age of  Onset   Liver cancer Mother    Colon cancer Mother    Breast cancer Mother 45   Diabetes Daughter    Kidney disease Daughter        adrenal tumors   Breast cancer Maternal Aunt    Kidney cancer Neg Hx    Prostate cancer Neg Hx     SOCIAL HISTORY: Social History   Socioeconomic History   Marital status: Married    Spouse name: Not on file   Number of children: Not on file   Years of education: Not on file   Highest education level: Not on file  Occupational History   Not on file  Social Needs   Financial resource strain: Not on file   Food insecurity    Worry: Not on file    Inability: Not on file   Transportation needs    Medical: Not on file    Non-medical: Not on file  Tobacco Use   Smoking status: Never Smoker   Smokeless tobacco: Never Used  Substance and Sexual Activity   Alcohol use: No    Alcohol/week: 0.0 standard drinks   Drug use: No   Sexual activity: Yes    Birth control/protection: Surgical  Lifestyle   Physical activity    Days per week: Not on file    Minutes per session: Not on file   Stress: Not on file  Relationships   Social connections    Talks on phone: Not on file    Gets together: Not on file    Attends religious service: Not on file    Active member of club or organization: Not on file    Attends meetings of clubs or organizations: Not on file    Relationship status: Not on file   Intimate partner violence    Fear of current or ex partner: Not on file    Emotionally abused: Not on file    Physically abused: Not on file    Forced sexual activity: Not on file  Other Topics Concern   Not on file  Social History Narrative   Pt is right handed   Lives alone in single story home (daughter and granddaughter rotate time there so pt is never alone in the home)   Has 4 children - 1 deceased/3 living   10th grade education   Retired from [employer] educational needs teacher  REVIEW OF SYSTEMS: Constitutional: No fevers, chills, or  sweats, no generalized fatigue, change in appetite Eyes: No visual changes, double vision, eye pain Ear, nose and throat: No hearing loss, ear pain, nasal congestion, sore throat Cardiovascular: No chest pain, palpitations Respiratory:  No shortness of breath at rest or with exertion, wheezes GastrointestinaI: No nausea, vomiting, diarrhea, abdominal pain, fecal incontinence Genitourinary:  No dysuria, urinary retention or frequency Musculoskeletal:  No neck pain, back pain Integumentary: No rash, pruritus, skin lesions Neurological: as above Psychiatric: No depression, insomnia, +anxiety Endocrine: No palpitations, fatigue, diaphoresis, mood swings, change in appetite, change in weight, increased thirst Hematologic/Lymphatic:  No anemia, purpura, petechiae. Allergic/Immunologic: no itchy/runny eyes, nasal congestion, recent allergic reactions, rashes  PHYSICAL EXAM: Vitals:   05/04/19 0948  BP: 136/70  Pulse: (!) 112  SpO2: 99%   General: No acute distress Head:  Normocephalic/atraumatic Skin/Extremities: No rash, no edema Neurological Exam: alert and oriented to person, place, and time. No aphasia or dysarthria. Fund of knowledge is appropriate.  Recent and remote memory are impaired.  Attention and concentration are reduced.    Able to name objects, difficulty with repetition. Montreal Cognitive Assessment  05/04/2019  Visuospatial/ Executive (0/5) 2  Naming (0/3) 2  Attention: Read list of digits (0/2) 2  Attention: Read list of letters (0/1) 1  Attention: Serial 7 subtraction starting at 100 (0/3) 0  Language: Repeat phrase (0/2) 0  Language : Fluency (0/1) 0  Abstraction (0/2) 0  Delayed Recall (0/5) 3  Orientation (0/6) 6  Total 16  Adjusted Score (based on education) 17    Cranial nerves: Pupils equal, round, reactive to light.  Extraocular movements intact with no nystagmus. Visual fields full. Facial sensation intact. No facial asymmetry. Tongue, uvula, palate  midline.  Motor: Bulk and tone normal, muscle strength 5/5 throughout with no pronator drift. Finger to nose testing intact.  Gait slow and cautious, no ataxia.  IMPRESSION: This is a 73 yo RH woman with a history of hypertension, hyperlipidemia, diabetes, hypothyroidism, with mild to moderate dementia, likely Alzheimer's disease. MOCA score today 17/30 (MMSE 24/30 in February 2020). MRI brain in 2018 showed age-appropriate volume loss, mild to moderate chronic microvascular disease. We discussed starting a different cholinesterase inhibitor, she is agreeable to starting low dose Rivastigmine 1.5mg  BID, side effects discussed. Continue Wellbutrin XL 150mg  daily and Seroquel 25mg  qhs. Refills sent. We discussed continued close home supervision, she does not drive. Follow-up in 6 months, they know to call for any changes.   Thank you for allowing me to participate in her care.  Please do not hesitate to call for any questions or concerns.  The duration of this appointment visit was 30 minutes of face-to-face time with the patient.  Greater than 50% of this time was spent in counseling, explanation of diagnosis, planning of further management, and coordination of care.   Ellouise Newer, M.D.   CC: Dr. Ouida Sills

## 2019-07-07 ENCOUNTER — Other Ambulatory Visit: Payer: Self-pay

## 2019-07-08 ENCOUNTER — Inpatient Hospital Stay: Payer: Medicare Other | Attending: Radiation Oncology

## 2019-07-08 ENCOUNTER — Other Ambulatory Visit: Payer: Self-pay

## 2019-07-08 ENCOUNTER — Ambulatory Visit
Admission: RE | Admit: 2019-07-08 | Discharge: 2019-07-08 | Disposition: A | Discharge status: Home / Self Care | Payer: Medicare Other | Source: Ambulatory Visit | Attending: Radiation Oncology | Admitting: Radiation Oncology

## 2019-07-08 DIAGNOSIS — R918 Other nonspecific abnormal finding of lung field: Secondary | ICD-10-CM | POA: Diagnosis present

## 2019-07-08 LAB — RENAL FUNCTION PANEL
Albumin: 4.2 g/dL (ref 3.5–5.0)
Anion gap: 12 (ref 5–15)
BUN: 14 mg/dL (ref 8–23)
CO2: 23 mmol/L (ref 22–32)
Calcium: 8.5 mg/dL — ABNORMAL LOW (ref 8.9–10.3)
Chloride: 104 mmol/L (ref 98–111)
Creatinine, Ser: 1.08 mg/dL — ABNORMAL HIGH (ref 0.44–1.00)
GFR calc Af Amer: 59 mL/min — ABNORMAL LOW (ref >60)
GFR calc non Af Amer: 51 mL/min — ABNORMAL LOW (ref >60)
Glucose, Bld: 128 mg/dL — ABNORMAL HIGH (ref 70–99)
Phosphorus: 4.4 mg/dL (ref 2.5–4.6)
Potassium: 4.2 mmol/L (ref 3.5–5.1)
Sodium: 139 mmol/L (ref 135–145)

## 2019-07-08 LAB — POCT I-STAT CREATININE: Creatinine, Ser: 0.9 mg/dL (ref 0.44–1.00)

## 2019-07-08 MED ORDER — IOHEXOL 300 MG/ML  SOLN
75.0000 mL | Freq: Once | INTRAMUSCULAR | Status: AC | PRN
  Administered 2019-07-08: 11:00:00 75 mL via INTRAVENOUS

## 2019-07-14 ENCOUNTER — Other Ambulatory Visit: Payer: Self-pay

## 2019-07-15 ENCOUNTER — Other Ambulatory Visit: Payer: Self-pay | Admitting: *Deleted

## 2019-07-15 ENCOUNTER — Ambulatory Visit
Admission: RE | Admit: 2019-07-15 | Discharge: 2019-07-15 | Disposition: A | Discharge status: Home / Self Care | Payer: Medicare Other | Source: Ambulatory Visit | Attending: Radiation Oncology | Admitting: Radiation Oncology

## 2019-07-15 ENCOUNTER — Encounter: Payer: Self-pay | Admitting: Radiation Oncology

## 2019-07-15 ENCOUNTER — Other Ambulatory Visit: Payer: Self-pay

## 2019-07-15 VITALS — BP 180/75 | HR 96 | Temp 98.3°F | Resp 16 | Wt 152.4 lb

## 2019-07-15 DIAGNOSIS — R918 Other nonspecific abnormal finding of lung field: Secondary | ICD-10-CM

## 2019-07-15 DIAGNOSIS — R599 Enlarged lymph nodes, unspecified: Secondary | ICD-10-CM | POA: Insufficient documentation

## 2019-07-15 DIAGNOSIS — Z923 Personal history of irradiation: Secondary | ICD-10-CM | POA: Insufficient documentation

## 2019-07-15 NOTE — Progress Notes (Signed)
Radiation Oncology Follow up Note  Name: Lori Blanchard   Date:   07/15/2019 MRN:  [id number] DOB: 25-Jul-1946    This 73 y.o. female presents to the clinic today for 41-month follow-up status post SBRT to her right lower lobe for presumed non-small cell lung cancer.  REFERRING PROVIDER: [name], MD  HPI: Patient is a 73 year old.  Who is now out 4 months having completed SBRT to right lower lobe for presumed non-small cell lung cancer stage I seen today in routine follow-up she continues to do fairly well although is having problems with breathing cough no hemoptysis or chest tightness.  She had a recent CT scans demonstrating a residual 7 mm peripheral peripheral right lower lobe nodule with surrounding changes of radiation.  She has stable mediastinal adenopathy.  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  COMPLICATIONS OF TREATMENT: none  FOLLOW UP COMPLIANCE: keeps appointments   PHYSICAL EXAM:  BP (!) 180/75 (BP Location: Left Arm, Patient Position: Sitting)   Pulse 96   Temp 98.3 F (36.8 C) (Tympanic)   Resp 16   Wt 152 lb 6.4 oz (69.1 kg)   BMI 34.17 kg/m  Well-developed obese female in NAD.  Well-developed well-nourished patient in NAD. HEENT reveals PERLA, EOMI, discs not visualized.  Oral cavity is clear. No oral mucosal lesions are identified. Neck is clear without evidence of cervical or supraclavicular adenopathy. Lungs are clear to A&P. Cardiac examination is essentially unremarkable with regular rate and rhythm without murmur rub or thrill. Abdomen is benign with no organomegaly or masses noted. Motor sensory and DTR levels are equal and symmetric in the upper and lower extremities. Cranial nerves II through XII are grossly intact. Proprioception is intact. No peripheral adenopathy or edema is identified. No motor or sensory levels are noted. Crude visual fields are within normal range.  RADIOLOGY RESULTS: CT  scans reviewed compatible with above-stated findings  PLAN: At the present time patient is stable by CT criteria.  I have suggested follow-up with Dr. Raul Del to try pulmonary tuneup as much as possible.  I have otherwise asked to see her back in 6 months for follow-up with a CT scan without contrast prior to that visit.  Patient and daughter both comprehend my treatment plan and recommendations well.  I would like to take this opportunity to thank you for allowing me to participate in the care of your patient.Noreene Filbert, MD

## 2019-11-11 ENCOUNTER — Ambulatory Visit
Admission: RE | Admit: 2019-11-11 | Discharge: 2019-11-11 | Disposition: A | Discharge status: Home / Self Care | Payer: Medicare Other | Source: Ambulatory Visit | Attending: Radiation Oncology | Admitting: Radiation Oncology

## 2019-11-11 ENCOUNTER — Other Ambulatory Visit: Payer: Self-pay

## 2019-11-11 DIAGNOSIS — R918 Other nonspecific abnormal finding of lung field: Secondary | ICD-10-CM | POA: Insufficient documentation

## 2019-11-25 ENCOUNTER — Encounter: Payer: Self-pay | Admitting: Radiation Oncology

## 2019-11-25 ENCOUNTER — Other Ambulatory Visit: Payer: Self-pay | Admitting: *Deleted

## 2019-11-25 ENCOUNTER — Other Ambulatory Visit: Payer: Self-pay

## 2019-11-25 ENCOUNTER — Ambulatory Visit
Admission: RE | Admit: 2019-11-25 | Discharge: 2019-11-25 | Disposition: A | Discharge status: Home / Self Care | Payer: Medicare Other | Source: Ambulatory Visit | Attending: Radiation Oncology | Admitting: Radiation Oncology

## 2019-11-25 VITALS — BP 133/80 | HR 92 | Temp 97.8°F | Resp 24 | Wt 144.6 lb

## 2019-11-25 DIAGNOSIS — R05 Cough: Secondary | ICD-10-CM | POA: Insufficient documentation

## 2019-11-25 DIAGNOSIS — R918 Other nonspecific abnormal finding of lung field: Secondary | ICD-10-CM

## 2019-11-25 DIAGNOSIS — Z923 Personal history of irradiation: Secondary | ICD-10-CM | POA: Insufficient documentation

## 2019-11-25 NOTE — Progress Notes (Signed)
Radiation Oncology Follow up Note  Name: Lori Blanchard   Date:   11/25/2019 MRN:  [id number] DOB: 05/18/1946    This 74 y.o. female presents to the clinic today for 55-month follow-up status post SBRT to right lower lobe for presumed non-small cell lung cancer.  REFERRING PROVIDER: [name], MD  HPI: Patient is a 74 year old female now at 8 months having completed SBRT to right lower lobe for presumed non-small cell lung cancer.  She is seen today in routine follow-up and is fairly stable from previous exam.  She is still on continuous nasal oxygen.  She specifically denies hemoptysis.  She does have a slight productive cough no dysphagia.Marland Kitchen  Recent CT scan from earlier this month showed scarring in the right lower lobe with a 7 mm peripheral nodule no longer appreciated 7 mm right subpleural nodule stable.  She has mid and lower lung zone fibrotic changes similar to previous scans.  According to her daughter her main problem is weight loss.    COMPLICATIONS OF TREATMENT: none  FOLLOW UP COMPLIANCE: keeps appointments   PHYSICAL EXAM:  BP 133/80 (BP Location: Right Arm, Patient Position: Sitting, Cuff Size: Small)   Pulse 92   Temp 97.8 F (36.6 C) (Tympanic)   Resp (!) 24   Wt 144 lb 9.6 oz (65.6 kg)   BMI 32.42 kg/m  Well-developed slightly obese female in NAD on nasal oxygen.  Well-developed well-nourished patient in NAD. HEENT reveals PERLA, EOMI, discs not visualized.  Oral cavity is clear. No oral mucosal lesions are identified. Neck is clear without evidence of cervical or supraclavicular adenopathy. Lungs are clear to A&P. Cardiac examination is essentially unremarkable with regular rate and rhythm without murmur rub or thrill. Abdomen is benign with no organomegaly or masses noted. Motor sensory and DTR levels are equal and symmetric in the upper and lower extremities. Cranial nerves II through XII are grossly intact. Proprioception is intact. No peripheral adenopathy or  edema is identified. No motor or sensory levels are noted. Crude visual fields are within normal range.  RADIOLOGY RESULTS: CT scans reviewed compatible with above-stated findings  PLAN: Present time she is stable based on her CT findings.  I have asked to see her back in 6 months for follow-up with a CT scan prior to that visit.  I am also scheduling the patient for an appointment with dietary to help her with some of her nutritional needs.  Patient and family know to call with any concerns.  I would like to take this opportunity to thank you for allowing me to participate in the care of your patient.Noreene Filbert, MD

## 2019-12-04 ENCOUNTER — Ambulatory Visit: Payer: Medicare Other | Admitting: Neurology

## 2019-12-04 ENCOUNTER — Other Ambulatory Visit: Payer: Self-pay

## 2019-12-04 ENCOUNTER — Encounter: Payer: Self-pay | Admitting: Neurology

## 2019-12-04 VITALS — BP 156/65 | HR 75 | Resp 20 | Ht <= 58 in | Wt 146.0 lb

## 2019-12-04 DIAGNOSIS — F0281 Dementia in other diseases classified elsewhere with behavioral disturbance: Secondary | ICD-10-CM | POA: Diagnosis not present

## 2019-12-04 DIAGNOSIS — G301 Alzheimer's disease with late onset: Secondary | ICD-10-CM | POA: Diagnosis not present

## 2019-12-04 MED ORDER — RIVASTIGMINE TARTRATE 3 MG PO CAPS: 3.0000 mg | ORAL_CAPSULE | Freq: Two times a day (BID) | ORAL | 3 refills | Status: DC

## 2019-12-04 MED ORDER — BUPROPION HCL ER (XL) 150 MG PO TB24: 150.0000 mg | ORAL_TABLET | Freq: Every day | ORAL | 3 refills | Status: DC

## 2019-12-04 MED ORDER — QUETIAPINE FUMARATE 25 MG PO TABS: ORAL_TABLET | ORAL | 3 refills | Status: DC

## 2019-12-04 NOTE — Progress Notes (Signed)
NEUROLOGY FOLLOW UP OFFICE NOTE  [name] BT:[id number] 10/15/1945  HISTORY OF PRESENT ILLNESS: I had the pleasure of seeing Lori Blanchard in follow-up in the neurology clinic on 12/04/2019.  The patient was last seen 7 months ago for dementia. She is again accompanied by her daughter [name] who helps supplement the history. I spoke to [place] separately before the visit to address concerns. [name] reports progressive decline. She does not remember conversations from 5 minutes prior. Her granddaughter now administers medications because she was forgetting to take them. [employer]. She is not driving. She is independent with dressing and bathing. They have to argue with her to eat, she has not been able to smell anything. [name] also reports "her lungs are awful." She refuses/forgets to wear her O2. She says she is sleeping, and denies napping during the day. [name] reports hallucinations and delusions, she sees sparkly things at night, bugs in her bed. She feels like someone is kicking her in bed when asleep. They have a harder time in the evening hours. The other day she could not walk due to pain from arthritis. She fell 3 months ago, no injuries. She is on Rivastigmine 1.5mg  BID, Wellbutrin 150mg  daily and Seroquel 25mg  qhs without side effects.    History on Initial Assessment 09/22/2018: This is a 74 year old ambidextrous right-hand dominant woman with a history of hypertension, hyperlipidemia, diabetes, hypothyroidism, presenting for second opinion regarding dementia. She has been seeing [facility] since October 2016, last visit was in July 2019. On initial visit with Dr. Manuella Ghazi in October 2016, family reported memory decline starting around 2015. She was becoming more forgetful and family started checking behind her with bill payments. They deny getting lost driving. She had a SLUMS score of 18/30 and was started on Donepezil 5mg  daily. In March 2017, family reported auditory and visual  hallucinations of things outside. She had an EEG in April 2017 which showed generalized fronto-central predominant delta slowing of the background. In November 2017, SLUMS score was 17/30, Donepezil increased to 10mg  daily which caused headaches, dizziness, and GI symptoms. Family was reporting more mood disturbances and was started on Lamotrigine for mood stabilization. This caused itching and rash. She was reporting headaches and was started on magnesium. She had an MRI brain in April 2018 which did not show any acute changes. There was note of stable cerebral volume since 2016 with no areas of disproportionate cerebral atrophy, mild to moderate chronic microvascular disease. In January 2019, family reported decline in memory and vivid dreams and hallucinations seeing her deceased son and parents. Donepezil was stopped and Seroquel 25mg  qhs started. There were no further hallucinations or vivid dreams since them.   [name] today reports that she has not noticed significant decline since 2015. The patient states her memory is okay. She lives alone but her daughter stays with her during the day and granddaughter stays with her at night. [name] reports she writes her own bills and family checks behind her, she had only missed one bill in the past. If she gets confused, she asks for help. She stopped driving [id number] years ago at Dr. Trena Platt recommendation. Her other daughter fixes her pillbox and she is pretty good with taking them by herself. She does not cook. Over the past 6 months, she is having more agitation and anxiety, she gets mad at herself when she cannot remember. She would start cussing, which is unusual for her. She has been on Wellbutrin XL 150mg   daily for several years. Attempts to stop it in the past caused increased emotionality. Her daughter reports she is very negative and possibly paranoid. Sleep is good, no wandering behavior. She gets upset when family stays with her, she "just wants to be left some  time by herself," getting upset when her daughter has to go to the grocery and bring her when she just wants to stay home and do her coloring.   She denies any further significant headaches, no dizziness, diplopia, dysarthria/dysphagia, neck/back pain, focal numbness/tingling/weakness, bowel/bladder dysfunction, anosmia, or tremor. She has left shoulder arthritis. Her daughter feels she has balance issues, she nearly tripped a month ago and her daughter caught her. Her father had dementia in his 79s. No history of significant head injuries or alcohol use.   Laboratory Data: 06/10/18: TSH 2.865, 10/01/16 B12 399  PAST MEDICAL HISTORY: Past Medical History:  Diagnosis Date  . Alzheimer disease ([place])   . Anxiety   . Anxiety and depression   . Asthma   . Cancer ([place])    Basal Cell Skin Cancer/ LUNG/RADIATION LAST 8/20  . CHF (congestive heart failure) ([place])    patient denies this diagnosis  . Complication of anesthesia    2/20 oxygen and stayed overnight  . COPD (chronic obstructive pulmonary disease) ([place])   . Depression   . Diabetes ([place])   . Diverticulosis    patient denies problems for this  . Dyspnea   . Elevated lipids   . Fatty liver   . GERD (gastroesophageal reflux disease)   . Gout    no medication at this time  . History of kidney stones   . Hydronephrosis    stage III ckd  . Hyperlipemia   . Hypertension   . Hypothyroidism   . Lower extremity edema    comes and goes  . Osteoarthritis   . Osteoarthritis   . Osteoporosis   . Pulmonary nodule    has been followed for years, not cancerous. causes breathing issues  . Retroperitoneal fibrosis   . Sleep apnea    CPAP  . Ureter injury   . Ureteropelvic junction (UPJ) obstruction    requires change of ureteral stent approximately every 2 to 3 months    MEDICATIONS: Current Outpatient Medications on File Prior to Visit  Medication Sig Dispense Refill  . albuterol (PROVENTIL HFA;VENTOLIN HFA) 108 (90 Base) MCG/ACT  inhaler Inhale 2 puffs into the lungs every 4 (four) hours as needed for wheezing or shortness of breath.    Marland Kitchen alendronate (FOSAMAX) 70 MG tablet Take 70 mg by mouth every Friday.     Marland Kitchen aspirin EC 81 MG tablet Take 81 mg by mouth daily.     Marland Kitchen atorvastatin (LIPITOR) 40 MG tablet Take 40 mg by mouth daily.     Marland Kitchen buPROPion (WELLBUTRIN XL) 150 MG 24 hr tablet Take 1 tablet (150 mg total) by mouth daily. 90 tablet 3  . furosemide (LASIX) 20 MG tablet Take 20 mg by mouth daily.     Marland Kitchen gabapentin (NEURONTIN) 100 MG capsule Take 100 mg by mouth 2 (two) times daily.   0  . insulin lispro (HUMALOG) 100 UNIT/ML KiwkPen Inject 12 Units into the skin 3 (three) times daily before meals.     Marland Kitchen LANTUS SOLOSTAR 100 UNIT/ML Solostar Pen Inject 35 Units into the skin at bedtime.     Marland Kitchen levothyroxine (SYNTHROID, LEVOTHROID) 75 MCG tablet Take 75 mcg by mouth daily before breakfast.    . metFORMIN (  GLUCOPHAGE) 1000 MG tablet Take 1,000 mg by mouth 2 (two) times daily with a meal.    . ONETOUCH ULTRA test strip     . pantoprazole (PROTONIX) 40 MG tablet Take 40 mg by mouth daily.     . QUEtiapine (SEROQUEL) 25 MG tablet Take 1 tablet (25 mg total) by mouth at bedtime. 90 tablet 3  . rivastigmine (EXELON) 1.5 MG capsule Take 1 capsule (1.5 mg total) by mouth 2 (two) times daily. 60 capsule 11  . telmisartan (MICARDIS) 40 MG tablet Take 40 mg by mouth daily.     . traMADol (ULTRAM) 50 MG tablet     . umeclidinium-vilanterol (ANORO ELLIPTA) 62.5-25 MCG/INH AEPB Inhale 1 puff into the lungs daily.    . vitamin B-12 (CYANOCOBALAMIN) 1000 MCG tablet Take 1,000 mcg by mouth daily.     No current facility-administered medications on file prior to visit.    ALLERGIES: Allergies  Allergen Reactions  . Ibuprofen Itching, Nausea Only and Rash    FAMILY HISTORY: Family History  Problem Relation Age of Onset  . Liver cancer Mother   . Colon cancer Mother   . Breast cancer Mother 75  . Diabetes Daughter   . Kidney  disease Daughter        adrenal tumors  . Breast cancer Maternal Aunt   . Kidney cancer Neg Hx   . Prostate cancer Neg Hx     SOCIAL HISTORY: Social History   Socioeconomic History  . Marital status: Married    Spouse name: Not on file  . Number of children: Not on file  . Years of education: Not on file  . Highest education level: Not on file  Occupational History  . Not on file  Tobacco Use  . Smoking status: Never Smoker  . Smokeless tobacco: Never Used  Substance and Sexual Activity  . Alcohol use: No    Alcohol/week: 0.0 standard drinks  . Drug use: No  . Sexual activity: Yes    Birth control/protection: Surgical  Other Topics Concern  . Not on file  Social History Narrative   Pt is right handed   Lives alone in single story home (daughter and granddaughter rotate time there so pt is never alone in the home)   Has 4 children - 1 deceased/3 living   10th grade education   Retired from [employer] Strain:   . Difficulty of Paying Living Expenses:   Food Insecurity:   . Worried About Charity fundraiser in the Last Year:   . Arboriculturist in the Last Year:   Transportation Needs:   . Film/video editor (Medical):   Marland Kitchen Lack of Transportation (Non-Medical):   Physical Activity:   . Days of Exercise per Week:   . Minutes of Exercise per Session:   Stress:   . Feeling of Stress :   Social Connections:   . Frequency of Communication with Friends and Family:   . Frequency of Social Gatherings with Friends and Family:   . Attends Religious Services:   . Active Member of Clubs or Organizations:   . Attends Archivist Meetings:   Marland Kitchen Marital Status:   Intimate Partner Violence:   . Fear of Current or Ex-Partner:   . Emotionally Abused:   Marland Kitchen Physically Abused:   . Sexually Abused:     REVIEW OF SYSTEMS: Constitutional: No fevers, chills, or sweats, no generalized fatigue,  change in  appetite Eyes: No visual changes, double vision, eye pain Ear, nose and throat: No hearing loss, ear pain, nasal congestion, sore throat Cardiovascular: No chest pain, palpitations Respiratory:  No shortness of breath at rest or with exertion, wheezes GastrointestinaI: No nausea, vomiting, diarrhea, abdominal pain, fecal incontinence Genitourinary:  No dysuria, urinary retention or frequency Musculoskeletal:  + neck pain, back pain Integumentary: No rash, pruritus, skin lesions Neurological: as above Psychiatric: No depression, insomnia, anxiety Endocrine: No palpitations, fatigue, diaphoresis, mood swings, change in appetite, change in weight, increased thirst Hematologic/Lymphatic:  No anemia, purpura, petechiae. Allergic/Immunologic: no itchy/runny eyes, nasal congestion, recent allergic reactions, rashes  PHYSICAL EXAM: Vitals:   12/04/19 0957  BP: (!) 156/65  Pulse: 75  Resp: 20  SpO2: 100%   General: No acute distress Head:  Normocephalic/atraumatic Skin/Extremities: No rash, no edema Neurological Exam: alert and oriented to person, city/state. Month is March, did not know year. No aphasia or dysarthria. Fund of knowledge is reduced. Recent and remote memory are impaired. Attention and concentration are normal.    Able to name objects, difficulty with repetition. MMSE 19/30. MMSE - Mini Mental State Exam 12/04/2019 09/22/2018  Orientation to time 0 4  Orientation to Place 3 3  Registration 3 3  Attention/ Calculation 4 5  Recall 2 2  Language- name 2 objects 2 2  Language- repeat 0 0  Language- follow 3 step command 2 3  Language- read & follow direction 1 1  Write a sentence 1 1  Copy design 1 0  Total score 19 24   Cranial nerves: Pupils equal, round, reactive to light. Extraocular movements intact with no nystagmus. Visual fields full. No facial asymmetry. Motor: Bulk and tone normal, muscle strength 5/5 throughout with no pronator drift.  Finger to nose testing  intact.  Gait slow and cautious with foot turned out (reporting knot on the side).   IMPRESSION: This is a 74 yo RH woman with a history of hypertension, hyperlipidemia, diabetes, hypothyroidism, with moderate dementia, likely Alzheimer's disease. MMSE today 19/30 (24/30 in 09/2018). Increase Rivastigmine to 3mg  BID. She is also having more behavioral changes and hallucinations at night, increase Seroquel to 50mg  qhs. Continue Wellbutrin. She was advised to use her oxygen at all times. She is not driving. Continue close supervision. Follow-up in 6 months, they know to call for any changes.   Thank you for allowing me to participate in her care.  Please do not hesitate to call for any questions or concerns.   Ellouise Newer, M.D.   CC: Dr. Ouida Sills

## 2019-12-04 NOTE — Patient Instructions (Addendum)
1. Increase Rivastigmine to 3mg : take 1 capsule twice a day   2. Increase Seroquel 25mg : take 2 tablets every evening  3. Use your oxygen at all times  4. No further driving  5. Continue close supervision  6. Follow-up in 6 months, call for any changes  FALL PRECAUTIONS: Be cautious when walking. Scan the area for obstacles that may increase the risk of trips and falls. When getting up in the mornings, sit up at the edge of the bed for a few minutes before getting out of bed. Consider elevating the bed at the head end to avoid drop of blood pressure when getting up. Walk always in a well-lit room (use night lights in the walls). Avoid area rugs or power cords from appliances in the middle of the walkways. Use a walker or a cane if necessary and consider physical therapy for balance exercise. Get your eyesight checked regularly.   HOME SAFETY: Consider the safety of the kitchen when operating appliances like stoves, microwave oven, and blender. Consider having supervision and share cooking responsibilities until no longer able to participate in those. Accidents with firearms and other hazards in the house should be identified and addressed as well.   ABILITY TO BE LEFT ALONE: If patient is unable to contact 911 operator, consider using LifeLine, or when the need is there, arrange for someone to stay with patients. Smoking is a fire hazard, consider supervision or cessation. Risk of wandering should be assessed by caregiver and if detected at any point, supervision and safe proof recommendations should be instituted.   RECOMMENDATIONS FOR ALL PATIENTS WITH MEMORY PROBLEMS: 1. Continue to exercise (Recommend 30 minutes of walking everyday, or 3 hours every week) 2. Increase social interactions - continue going to [place] and enjoy social gatherings with friends and family 3. Eat healthy, avoid fried foods and eat more fruits and vegetables 4. Maintain adequate blood pressure, blood sugar, and  blood cholesterol level. Reducing the risk of stroke and cardiovascular disease also helps promoting better memory. 5. Avoid stressful situations. Live a simple life and avoid aggravations. Organize your time and prepare for the next day in anticipation. 6. Sleep well, avoid any interruptions of sleep and avoid any distractions in the bedroom that may interfere with adequate sleep quality 7. Avoid sugar, avoid sweets as there is a strong link between excessive sugar intake, diabetes, and cognitive impairment The Mediterranean diet has been shown to help patients reduce the risk of progressive memory disorders and reduces cardiovascular risk. This includes eating fish, eat fruits and green leafy vegetables, nuts like almonds and hazelnuts, walnuts, and also use olive oil. Avoid fast foods and fried foods as much as possible. Avoid sweets and sugar as sugar use has been linked to worsening of memory function.  There is always a concern of gradual progression of memory problems. If this is the case, then we may need to adjust level of care according to patient needs. Support, both to the patient and caregiver, should then be put into place.

## 2019-12-10 ENCOUNTER — Inpatient Hospital Stay: Payer: Medicare Other

## 2019-12-10 NOTE — Progress Notes (Signed)
Nutrition  Received notification that patient and family have cancelled nutrition appointment for today. RD available as needed if future appointment is wanted.   Murriel Holwerda B. Zenia Resides, [place], [place] Registered Dietitian [phone] (pager)

## 2020-03-01 ENCOUNTER — Ambulatory Visit (INDEPENDENT_AMBULATORY_CARE_PROVIDER_SITE_OTHER): Payer: Medicare Other

## 2020-03-01 ENCOUNTER — Other Ambulatory Visit: Payer: Self-pay | Admitting: Radiology

## 2020-03-01 ENCOUNTER — Other Ambulatory Visit: Payer: Self-pay

## 2020-03-01 DIAGNOSIS — N135 Crossing vessel and stricture of ureter without hydronephrosis: Secondary | ICD-10-CM

## 2020-03-01 DIAGNOSIS — Z01818 Encounter for other preprocedural examination: Secondary | ICD-10-CM

## 2020-03-01 NOTE — Progress Notes (Signed)
In and Out Catheterization  Patient is present today for a I & O catheterization due to pre-op UA and CX. Patient was cleaned and prepped in a sterile fashion with betadine . A 14FR cath was inserted no complications were noted , 226ml of urine return was noted, urine was clear in color. A clean urine sample was collected for UA & CX. Bladder was drained  And catheter was removed with out difficulty.    Preformed by: Fonnie Jarvis, CMA

## 2020-03-03 ENCOUNTER — Other Ambulatory Visit: Payer: Self-pay

## 2020-03-03 ENCOUNTER — Encounter
Admission: RE | Admit: 2020-03-03 | Discharge: 2020-03-03 | Disposition: A | Discharge status: Home / Self Care | Payer: Medicare Other | Source: Ambulatory Visit | Attending: Urology | Admitting: Urology

## 2020-03-03 DIAGNOSIS — I10 Essential (primary) hypertension: Secondary | ICD-10-CM | POA: Insufficient documentation

## 2020-03-03 DIAGNOSIS — Z20822 Contact with and (suspected) exposure to covid-19: Secondary | ICD-10-CM | POA: Insufficient documentation

## 2020-03-03 DIAGNOSIS — Z01818 Encounter for other preprocedural examination: Secondary | ICD-10-CM | POA: Insufficient documentation

## 2020-03-03 NOTE — Patient Instructions (Signed)
COVID TESTING Date: March 04, 2020  Testing site:  [facility] Thru Hours:  7:62 am - 1:00 pm Once you are tested, you are asked to stay quarantined (avoiding public places) until after your surgery.   Your procedure is scheduled on: March 08, 2020 TUESDAY Report to Day Surgery on the 2nd floor of the [facility]. To find out your arrival time, please call [phone] between 1PM - 3PM on: March 07, 2020 MONDAY  REMEMBER: Instructions that are not followed completely may result in serious medical risk, up to and including death; or upon the discretion of your surgeon and anesthesiologist your surgery may need to be rescheduled.  Do not eat food after midnight the night before surgery.  No gum chewing, lozengers or hard candies.  You may however, drink CLEAR liquids up to 2 hours before you are scheduled to arrive for your surgery. Do not drink anything within 2 hours of your scheduled arrival time.  Clear liquids include: - water   Do NOT drink anything that is not on this list.  Type 1 and Type 2 diabetics should only drink water.   TAKE THESE MEDICATIONS THE MORNING OF SURGERY WITH A SIP OF WATER: LEVOTHYROXINE ATORVASTATIN WELLBUTRIN GABAPENTIN PANTOPRAZOLE (take one the night before and one on the morning of surgery - helps to prevent nausea after surgery.)  Use inhalers on the day of surgery and bring to the hospital.  Stop Metformin  2 days prior to surgery. LAST DOSE Saturday 03/05/2020  Take 1/2 of usual insulin dose the night before surgery and none on the morning of surgery.  Follow recommendations from Cardiologist, Pulmonologist or PCP regarding stopping Aspirin, Coumadin, Plavix, Eliquis, Pradaxa, or Pletal. STOP ASPIRIN  Stop Anti-inflammatories (NSAIDS) such as Advil, Aleve, Ibuprofen, Motrin, Naproxen, Naprosyn and Aspirin based products such as Excedrin, Goodys Powder, BC Powder. (May take Tylenol or  Acetaminophen if needed.)  Stop ANY OVER THE COUNTER supplements until after surgery. (May continue Vitamin D, Vitamin B, and multivitamin.)  No Alcohol for 24 hours before or after surgery.  No Smoking including e-cigarettes for 24 hours prior to surgery.  No chewable tobacco products for at least 6 hours prior to surgery.  No nicotine patches on the day of surgery.  Do not use any "recreational" drugs for at least a week prior to your surgery.  Please be advised that the combination of cocaine and anesthesia may have negative outcomes, up to and including death. If you test positive for cocaine, your surgery will be cancelled.  On the morning of surgery brush your teeth with toothpaste and water, you may rinse your mouth with mouthwash if you wish. Do not swallow any toothpaste or mouthwash.  Do not wear jewelry, make-up, hairpins, clips or nail polish.  Do not wear lotions, powders, or perfumes.   Do not shave 48 hours prior to surgery.   Contact lenses, hearing aids and dentures may not be worn into surgery.  Do not bring valuables to the hospital. [facility] is not responsible for any missing/lost belongings or valuables.   TAKE SHOWER DAY OF SURGERY.  Bring your C-PAP to the hospital with you in case you may have to spend the night.   Notify your doctor if there is any change in your medical condition (cold, fever, infection).  Wear comfortable clothing (specific to your surgery type) to the hospital.  Plan for stool softeners for home use; pain medications have a tendency to  cause constipation. You can also help prevent constipation by eating foods high in fiber such as fruits and vegetables and drinking plenty of fluids as your diet allows.  After surgery, you can help prevent lung complications by doing breathing exercises.  Take deep breaths and cough every 1-2 hours. Your doctor may order a device called an Incentive Spirometer to help you take deep breaths. When  coughing or sneezing, hold a pillow firmly against your incision with both hands. This is called "splinting." Doing this helps protect your incision. It also decreases belly discomfort.  If you are being discharged the day of surgery, you will not be allowed to drive home. You will need a responsible adult (18 years or older) to drive you home and stay with you that night.   Please call the [facility]. at [phone] if you have any questions about these instructions.  Visitation Policy:  Patients undergoing a surgery or procedure may have one family member or support person with them as long as that person is not COVID-19 positive or experiencing its symptoms.  That person may remain in the waiting area during the procedure.  Children under 57 years of age may have both parents or legal guardians with them during their procedure.  Inpatient Visitation Update:   Two designated support people may visit a patient during visiting hours 7 am to 8 pm. It must be the same two designated people for the duration of the patient stay. The visitors may come and go during the day, and there is no switching out to have different visitors. A mask must be worn at all times, including in the patient room.  Children under 66 years of age:  a total of 4 designated visitors for the child's entire stay are allowed. Only 2 in the room at a time and only one staying overnight at a time. The overnight guest can now rotate during the child's hospital stay.  As a reminder, masks are still required for all [facility] team members, patients and visitors in all [facility] facilities.   Systemwide, no visitors 17 or younger.

## 2020-03-04 ENCOUNTER — Other Ambulatory Visit
Admission: RE | Admit: 2020-03-04 | Discharge: 2020-03-04 | Disposition: A | Discharge status: Home / Self Care | Payer: Medicare Other | Source: Ambulatory Visit | Attending: Urology | Admitting: Urology

## 2020-03-04 DIAGNOSIS — Z01818 Encounter for other preprocedural examination: Secondary | ICD-10-CM | POA: Diagnosis present

## 2020-03-04 DIAGNOSIS — Z20822 Contact with and (suspected) exposure to covid-19: Secondary | ICD-10-CM | POA: Diagnosis not present

## 2020-03-04 DIAGNOSIS — I10 Essential (primary) hypertension: Secondary | ICD-10-CM | POA: Diagnosis not present

## 2020-03-04 LAB — BASIC METABOLIC PANEL
Anion gap: 10 (ref 5–15)
BUN: 18 mg/dL (ref 8–23)
CO2: 24 mmol/L (ref 22–32)
Calcium: 9.2 mg/dL (ref 8.9–10.3)
Chloride: 103 mmol/L (ref 98–111)
Creatinine, Ser: 1 mg/dL (ref 0.44–1.00)
GFR calc Af Amer: >60 mL/min (ref >60)
GFR calc non Af Amer: 55 mL/min — ABNORMAL LOW (ref >60)
Glucose, Bld: 115 mg/dL — ABNORMAL HIGH (ref 70–99)
Potassium: 4.3 mmol/L (ref 3.5–5.1)
Sodium: 137 mmol/L (ref 135–145)

## 2020-03-04 LAB — CULTURE, URINE COMPREHENSIVE

## 2020-03-04 LAB — CBC
HCT: 33.9 % — ABNORMAL LOW (ref 36.0–46.0)
Hemoglobin: 10.5 g/dL — ABNORMAL LOW (ref 12.0–15.0)
MCH: 23.7 pg — ABNORMAL LOW (ref 26.0–34.0)
MCHC: 31 g/dL (ref 30.0–36.0)
MCV: 76.5 fL — ABNORMAL LOW (ref 80.0–100.0)
Platelets: 312 10*3/uL (ref 150–400)
RBC: 4.43 MIL/uL (ref 3.87–5.11)
RDW: 16.2 % — ABNORMAL HIGH (ref 11.5–15.5)
WBC: 8 10*3/uL (ref 4.0–10.5)
nRBC: 0 % (ref 0.0–0.2)

## 2020-03-04 LAB — SARS CORONAVIRUS 2 (TAT 6-24 HRS): SARS Coronavirus 2: NEGATIVE

## 2020-03-07 MED ORDER — SODIUM CHLORIDE 0.9 % IV SOLN: INTRAVENOUS | Status: DC

## 2020-03-07 MED ORDER — CHLORHEXIDINE GLUCONATE 0.12 % MT SOLN: 15.0000 mL | Freq: Once | OROMUCOSAL | Status: AC

## 2020-03-07 MED ORDER — ORAL CARE MOUTH RINSE: 15.0000 mL | Freq: Once | OROMUCOSAL | Status: AC

## 2020-03-07 MED ORDER — CEFAZOLIN SODIUM-DEXTROSE 2-4 GM/100ML-% IV SOLN
2.0000 g | INTRAVENOUS | Status: AC
  Administered 2020-03-08: 2 g via INTRAVENOUS

## 2020-03-08 ENCOUNTER — Ambulatory Visit: Payer: Medicare Other | Admitting: Certified Registered"

## 2020-03-08 ENCOUNTER — Ambulatory Visit: Payer: Medicare Other

## 2020-03-08 ENCOUNTER — Ambulatory Visit
Admission: RE | Admit: 2020-03-08 | Discharge: 2020-03-08 | Disposition: A | Discharge status: Home / Self Care | Payer: Medicare Other | Attending: Urology | Admitting: Urology

## 2020-03-08 ENCOUNTER — Other Ambulatory Visit: Payer: Self-pay

## 2020-03-08 ENCOUNTER — Encounter
Admission: RE | Disposition: A | Discharge status: Home / Self Care | Payer: Self-pay | Source: Home / Self Care | Attending: Urology

## 2020-03-08 ENCOUNTER — Encounter: Payer: Self-pay | Admitting: Urology

## 2020-03-08 DIAGNOSIS — F0280 Dementia in other diseases classified elsewhere without behavioral disturbance: Secondary | ICD-10-CM | POA: Diagnosis not present

## 2020-03-08 DIAGNOSIS — J449 Chronic obstructive pulmonary disease, unspecified: Secondary | ICD-10-CM | POA: Diagnosis not present

## 2020-03-08 DIAGNOSIS — F419 Anxiety disorder, unspecified: Secondary | ICD-10-CM | POA: Insufficient documentation

## 2020-03-08 DIAGNOSIS — Z466 Encounter for fitting and adjustment of urinary device: Secondary | ICD-10-CM | POA: Insufficient documentation

## 2020-03-08 DIAGNOSIS — I130 Hypertensive heart and chronic kidney disease with heart failure and stage 1 through stage 4 chronic kidney disease, or unspecified chronic kidney disease: Secondary | ICD-10-CM | POA: Diagnosis not present

## 2020-03-08 DIAGNOSIS — K219 Gastro-esophageal reflux disease without esophagitis: Secondary | ICD-10-CM | POA: Insufficient documentation

## 2020-03-08 DIAGNOSIS — N183 Chronic kidney disease, stage 3 unspecified: Secondary | ICD-10-CM | POA: Diagnosis not present

## 2020-03-08 DIAGNOSIS — E039 Hypothyroidism, unspecified: Secondary | ICD-10-CM | POA: Diagnosis not present

## 2020-03-08 DIAGNOSIS — Z794 Long term (current) use of insulin: Secondary | ICD-10-CM | POA: Insufficient documentation

## 2020-03-08 DIAGNOSIS — Z79890 Hormone replacement therapy (postmenopausal): Secondary | ICD-10-CM | POA: Diagnosis not present

## 2020-03-08 DIAGNOSIS — Z7983 Long term (current) use of bisphosphonates: Secondary | ICD-10-CM | POA: Insufficient documentation

## 2020-03-08 DIAGNOSIS — G309 Alzheimer's disease, unspecified: Secondary | ICD-10-CM | POA: Insufficient documentation

## 2020-03-08 DIAGNOSIS — N131 Hydronephrosis with ureteral stricture, not elsewhere classified: Secondary | ICD-10-CM | POA: Diagnosis not present

## 2020-03-08 DIAGNOSIS — G4730 Sleep apnea, unspecified: Secondary | ICD-10-CM | POA: Diagnosis not present

## 2020-03-08 DIAGNOSIS — Z96653 Presence of artificial knee joint, bilateral: Secondary | ICD-10-CM | POA: Insufficient documentation

## 2020-03-08 DIAGNOSIS — E1122 Type 2 diabetes mellitus with diabetic chronic kidney disease: Secondary | ICD-10-CM | POA: Diagnosis not present

## 2020-03-08 DIAGNOSIS — I509 Heart failure, unspecified: Secondary | ICD-10-CM | POA: Diagnosis not present

## 2020-03-08 DIAGNOSIS — Z7982 Long term (current) use of aspirin: Secondary | ICD-10-CM | POA: Diagnosis not present

## 2020-03-08 DIAGNOSIS — M810 Age-related osteoporosis without current pathological fracture: Secondary | ICD-10-CM | POA: Diagnosis not present

## 2020-03-08 DIAGNOSIS — F329 Major depressive disorder, single episode, unspecified: Secondary | ICD-10-CM | POA: Insufficient documentation

## 2020-03-08 DIAGNOSIS — Z79899 Other long term (current) drug therapy: Secondary | ICD-10-CM | POA: Diagnosis not present

## 2020-03-08 DIAGNOSIS — E785 Hyperlipidemia, unspecified: Secondary | ICD-10-CM | POA: Diagnosis not present

## 2020-03-08 DIAGNOSIS — Z85828 Personal history of other malignant neoplasm of skin: Secondary | ICD-10-CM | POA: Diagnosis not present

## 2020-03-08 DIAGNOSIS — N130 Hydronephrosis with ureteropelvic junction obstruction: Secondary | ICD-10-CM

## 2020-03-08 DIAGNOSIS — N135 Crossing vessel and stricture of ureter without hydronephrosis: Secondary | ICD-10-CM

## 2020-03-08 HISTORY — PX: CYSTOSCOPY WITH STENT PLACEMENT: SHX5790

## 2020-03-08 LAB — GLUCOSE, CAPILLARY
Glucose-Capillary: 110 mg/dL — ABNORMAL HIGH (ref 70–99)
Glucose-Capillary: 110 mg/dL — ABNORMAL HIGH (ref 70–99)
Glucose-Capillary: 151 mg/dL — ABNORMAL HIGH (ref 70–99)

## 2020-03-08 SURGERY — CYSTOSCOPY, WITH STENT INSERTION
Anesthesia: General | Laterality: Right

## 2020-03-08 MED ORDER — HYDROCODONE-ACETAMINOPHEN 5-325 MG PO TABS: 0.5000 | ORAL_TABLET | Freq: Four times a day (QID) | ORAL | 0 refills | Status: DC | PRN

## 2020-03-08 MED ORDER — DEXMEDETOMIDINE HCL IN NACL 200 MCG/50ML IV SOLN
INTRAVENOUS | Status: DC | PRN
Start: 2020-03-08 — End: 2020-03-08
  Administered 2020-03-08: 12 ug via INTRAVENOUS
  Administered 2020-03-08: 16 ug via INTRAVENOUS
  Administered 2020-03-08: 12 ug via INTRAVENOUS

## 2020-03-08 MED ORDER — FENTANYL CITRATE (PF) 100 MCG/2ML IJ SOLN: 25.0000 ug | INTRAMUSCULAR | Status: DC | PRN

## 2020-03-08 MED ORDER — DEXMEDETOMIDINE HCL IN NACL 80 MCG/20ML IV SOLN
INTRAVENOUS | Status: AC
  Filled 2020-03-08: qty 20

## 2020-03-08 MED ORDER — IOHEXOL 180 MG/ML  SOLN
INTRAMUSCULAR | Status: DC | PRN
  Administered 2020-03-08: 15 mL

## 2020-03-08 MED ORDER — CEFAZOLIN SODIUM-DEXTROSE 2-4 GM/100ML-% IV SOLN
INTRAVENOUS | Status: AC
  Filled 2020-03-08: qty 100

## 2020-03-08 MED ORDER — PROPOFOL 10 MG/ML IV BOLUS
INTRAVENOUS | Status: AC
  Filled 2020-03-08: qty 40

## 2020-03-08 MED ORDER — CHLORHEXIDINE GLUCONATE 0.12 % MT SOLN
OROMUCOSAL | Status: AC
  Administered 2020-03-08: 15 mL via OROMUCOSAL
  Filled 2020-03-08: qty 15

## 2020-03-08 MED ORDER — PROPOFOL 500 MG/50ML IV EMUL
INTRAVENOUS | Status: DC | PRN
  Administered 2020-03-08: 77 ug/kg/min via INTRAVENOUS

## 2020-03-08 MED ORDER — ONDANSETRON HCL 4 MG/2ML IJ SOLN: 4.0000 mg | Freq: Once | INTRAMUSCULAR | Status: DC | PRN

## 2020-03-08 MED ORDER — LIDOCAINE HCL (PF) 2 % IJ SOLN
INTRAMUSCULAR | Status: AC
  Filled 2020-03-08: qty 5

## 2020-03-08 SURGICAL SUPPLY — 19 items
BAG DRAIN CYSTO-URO LG1000N (MISCELLANEOUS) ×2 IMPLANT
BRUSH SCRUB EZ 1% IODOPHOR (MISCELLANEOUS) ×2 IMPLANT
CATH URETL 5X70 OPEN END (CATHETERS) IMPLANT
GLOVE BIOGEL PI IND STRL 7.5 (GLOVE) ×1 IMPLANT
GLOVE BIOGEL PI INDICATOR 7.5 (GLOVE) ×1
GOWN STRL REUS W/ TWL XL LVL3 (GOWN DISPOSABLE) ×1 IMPLANT
GOWN STRL REUS W/TWL XL LVL3 (GOWN DISPOSABLE) ×2
GUIDEWIRE STR DUAL SENSOR (WIRE) ×2 IMPLANT
KIT TURNOVER CYSTO (KITS) ×2 IMPLANT
PACK CYSTO AR (MISCELLANEOUS) ×2 IMPLANT
SET CYSTO W/LG BORE CLAMP LF (SET/KITS/TRAYS/PACK) ×2 IMPLANT
SOL .9 NS 3000ML IRR  AL (IV SOLUTION) ×2
SOL .9 NS 3000ML IRR AL (IV SOLUTION) ×1
SOL .9 NS 3000ML IRR UROMATIC (IV SOLUTION) ×1 IMPLANT
STENT URET 6FRX24 CONTOUR (STENTS) IMPLANT
STENT URET 6FRX26 CONTOUR (STENTS) IMPLANT
STENT URO INLAY 6FRX22CM (STENTS) ×2 IMPLANT
SURGILUBE 2OZ TUBE FLIPTOP (MISCELLANEOUS) ×2 IMPLANT
WATER STERILE IRR 1000ML POUR (IV SOLUTION) ×2 IMPLANT

## 2020-03-08 NOTE — Progress Notes (Signed)
°   03/08/20 0755  Clinical Encounter Type  Visited With Family  Visit Type Initial  Referral From Chaplain  Consult/Referral To Chaplain  While rounding [facility] waiting area, chaplain stopped and spoke with patient's family member. Chaplain wanted to make sure they were alright while they were waiting and they were.

## 2020-03-08 NOTE — Anesthesia Preprocedure Evaluation (Addendum)
Anesthesia Evaluation  Patient identified by MRN, date of birth, ID band Patient awake    Reviewed: Allergy & Precautions, H&P , NPO status , Patient's Chart, lab work & pertinent test results, reviewed documented beta blocker date and time   History of Anesthesia Complications (+) history of anesthetic complications  Airway Mallampati: II  TM Distance: >3 FB Neck ROM: full    Dental  (+) Teeth Intact   Pulmonary shortness of breath and with exertion, asthma , sleep apnea , COPD,  COPD inhaler,    Pulmonary exam normal        Cardiovascular Exercise Tolerance: Poor hypertension, On Medications +CHF  Normal cardiovascular exam Rate:Normal     Neuro/Psych Anxiety Depression Dementia negative neurological ROS  negative psych ROS   GI/Hepatic Neg liver ROS, GERD  Medicated,  Endo/Other  diabetesHypothyroidism   Renal/GU Renal disease  negative genitourinary   Musculoskeletal  (+) Arthritis , Osteoarthritis,    Abdominal   Peds  Hematology  (+) Blood dyscrasia, anemia ,   Anesthesia Other Findings Past Medical History: No date: Alzheimer disease ([place]) No date: Anxiety No date: Anxiety and depression No date: Asthma No date: Cancer [facility])     Comment:  Basal Cell Skin Cancer/ LUNG/RADIATION LAST 8/20 No date: CHF (congestive heart failure) (HCC)     Comment:  patient denies this diagnosis No date: Complication of anesthesia     Comment:  2/20 oxygen and stayed overnight No date: COPD (chronic obstructive pulmonary disease) (HCC) No date: Depression No date: Diabetes ([place]) No date: Diverticulosis     Comment:  patient denies problems for this No date: Dyspnea No date: Elevated lipids No date: Fatty liver No date: GERD (gastroesophageal reflux disease) No date: Gout     Comment:  no medication at this time No date: History of kidney stones No date: Hydronephrosis     Comment:  stage III ckd No date:  Hyperlipemia No date: Hypertension No date: Hypothyroidism No date: Lower extremity edema     Comment:  comes and goes No date: Osteoarthritis No date: Osteoarthritis No date: Osteoporosis No date: Pulmonary nodule     Comment:  has been followed for years, not cancerous. 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 No date: Sleep apnea     Comment:  CPAP No date: Ureter injury No date: Ureteropelvic junction (UPJ) obstruction     Comment:  requires change of ureteral stent approximately every 2               to 3 months  Reproductive/Obstetrics negative OB ROS                             Anesthesia Physical  Anesthesia Plan  ASA: III  Anesthesia Plan: General   Post-op Pain Management:    Induction: Intravenous  PONV Risk Score and Plan: TIVA and Propofol infusion  Airway Management Planned: Nasal Cannula  Additional Equipment:   Intra-op Plan:   Post-operative Plan: Extubation in OR  Informed Consent: I have reviewed the patients History and Physical, chart, labs and discussed the procedure including the risks, benefits and alternatives for the proposed anesthesia with the patient or authorized representative who has indicated his/her understanding and acceptance.       Plan Discussed with: CRNA  Anesthesia Plan Comments: (Surgeon and family request TIVA, if possible, to avoid GOT.  Family understands that Pulmonary Hx presents a high risk.)     Anesthesia Quick Evaluation

## 2020-03-08 NOTE — Discharge Instructions (Addendum)
.  DISCHARGE INSTRUCTIONS FOR URETERAL STENT   MEDICATIONS:  1. Resume all your other meds from home.  2.  AZO (over-the-counter) can help with the burning/stinging when you urinate. 3.  Hydrocodone is for moderate/severe pain, Rx was sent to your pharmacy.  ACTIVITY:  1. May resume regular activities in 24 hours. 2. No driving while on narcotic pain medications  3. Drink plenty of water  4. Continue to walk at home - you can still get blood clots when you are at home, so keep active, but don't over do it.    BATHING:  1. You can shower or bathe.  SIGNS/SYMPTOMS TO CALL:  Please call us if you have a fever greater than 101.5, uncontrolled nausea/vomiting, uncontrolled pain, dizziness, unable to urinate, excessively bloody urine, chest pain, shortness of breath, leg swelling, leg pain, or any other concerns or questions.   You can reach [place] at [phone].   FOLLOW-UP:  1.  We will tentatively schedule stent exchange in 1 year.  Call earlier for any problems.   AMBULATORY SURGERY  DISCHARGE INSTRUCTIONS   1) The drugs that you were given will stay in your system until tomorrow so for the next 24 hours you should not:  A) Drive an automobile B) Make any legal decisions C) Drink any alcoholic beverage   2) You may resume regular meals tomorrow.  Today it is better to start with liquids and gradually work up to solid foods.  You may eat anything you prefer, but it is better to start with liquids, then soup and crackers, and gradually work up to solid foods.   3) Please notify your doctor immediately if you have any unusual bleeding, trouble breathing, redness and pain at the surgery site, drainage, fever, or pain not relieved by medication.    4) Additional Instructions:        Please contact your physician with any problems or Same Day Surgery at [phone], Monday through Friday 6 am to 4 pm, or [place] at [facility] number at [phone].

## 2020-03-08 NOTE — Anesthesia Procedure Notes (Signed)
Date/Time: 03/08/2020 9:04 AM Performed by: Gaynelle Cage, CRNA Pre-anesthesia Checklist: Patient identified, Emergency Drugs available, Suction available, Patient being monitored and Timeout performed Patient Re-evaluated:Patient Re-evaluated prior to induction Oxygen Delivery Method: Simple face mask Preoxygenation: Pre-oxygenation with 100% oxygen Induction Type: IV induction

## 2020-03-08 NOTE — H&P (Signed)
03/08/2020 8:49 AM   Lori Blanchard 1946-06-16 [id number]  Referring provider: No referring provider defined for this encounter.  No chief complaint on file.   HPI: 74 y.o. 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  Her stent was last exchanged 03/18/2019.  She has no complaints and presents today for stent exchange.   PMH: Past Medical History:  Diagnosis Date  . Alzheimer disease ([place])   . Anxiety   . Anxiety and depression   . Asthma   . Cancer ([place])    Basal Cell Skin Cancer/ LUNG/RADIATION LAST 8/20  . CHF (congestive heart failure) ([place])    patient denies this diagnosis  . Complication of anesthesia    2/20 oxygen and stayed overnight  . COPD (chronic obstructive pulmonary disease) ([place])   . Depression   . Diabetes ([place])   . Diverticulosis    patient denies problems for this  . Dyspnea   . Elevated lipids   . Fatty liver   . GERD (gastroesophageal reflux disease)   . Gout    no medication at this time  . History of kidney stones   . Hydronephrosis    stage III ckd  . Hyperlipemia   . Hypertension   . Hypothyroidism   . Lower extremity edema    comes and goes  . Osteoarthritis   . Osteoarthritis   . Osteoporosis   . Pulmonary nodule    has been followed for years, not cancerous. causes breathing issues  . Retroperitoneal fibrosis   . Sleep apnea    CPAP  . Ureter injury   . Ureteropelvic junction (UPJ) obstruction    requires change of ureteral stent approximately every 2 to 3 months    Surgical History: Past Surgical History:  Procedure Laterality Date  . ABDOMINAL HYSTERECTOMY     partial  . ANKLE FRACTURE SURGERY Left    pins in ankle but not in correct location  . APPENDECTOMY  1965  . BREAST SURGERY Bilateral 1990   Reduction  . c setion    . CARPAL TUNNEL RELEASE Bilateral 2000  . CESAREAN SECTION  4132,4401, 1969   x 3  .  CYSTOSCOPY W/ RETROGRADES Right 11/02/2015   Procedure: CYSTOSCOPY WITH RETROGRADE PYELOGRAM, URETERAL STENT EXCHANGE;  Surgeon: [name], MD;  Location: [facility];  Service: Urology;  Laterality: Right;  . CYSTOSCOPY W/ RETROGRADES Right 01/31/2016   Procedure: CYSTOSCOPY WITH RETROGRADE PYELOGRAM;  Surgeon: [name], MD;  Location: [facility];  Service: Urology;  Laterality: Right;  . CYSTOSCOPY W/ RETROGRADES Bilateral 05/09/2016   Procedure: CYSTOSCOPY WITH RETROGRADE PYELOGRAM;  Surgeon: [name], MD;  Location: [facility];  Service: Urology;  Laterality: Bilateral;  . CYSTOSCOPY W/ RETROGRADES Left 11/21/2016   Procedure: CYSTOSCOPY WITH RETROGRADE PYELOGRAM;  Surgeon: [name], MD;  Location: [facility];  Service: Urology;  Laterality: Left;  . CYSTOSCOPY W/ RETROGRADES Right 03/27/2017   Procedure: CYSTOSCOPY WITH RETROGRADE PYELOGRAM;  Surgeon: [name], MD;  Location: [facility];  Service: Urology;  Laterality: Right;  . CYSTOSCOPY W/ RETROGRADES Bilateral 05/31/2017   Procedure: CYSTOSCOPY WITH RETROGRADE PYELOGRAM;  Surgeon: [name], MD;  Location: [facility];  Service: Urology;  Laterality: Bilateral;  . CYSTOSCOPY W/ RETROGRADES Right 07/22/2018   Procedure: CYSTOSCOPY WITH RETROGRADE PYELOGRAM;  Surgeon: Abbie Sons, MD;  Location: [facility];  Service: Urology;  Laterality:  Right;  Marland Kitchen CYSTOSCOPY W/ URETERAL STENT PLACEMENT Right 05/11/2015   Procedure: CYSTOSCOPY WITH STENT REPLACEMENT;  Surgeon: [name], MD;  Location: [facility];  Service: Urology;  Laterality: Right;  . CYSTOSCOPY W/ URETERAL STENT PLACEMENT Right 01/31/2016   Procedure: CYSTOSCOPY, RETROGRADE PYELOGRAMS WITH STENT REPLACEMENT;  Surgeon: [name], MD;  Location: [facility];  Service: Urology;  Laterality: Right;  . CYSTOSCOPY W/ URETERAL STENT PLACEMENT Right 05/09/2016   Procedure: CYSTOSCOPY WITH STENT REPLACEMENT;  Surgeon: [name], MD;   Location: [facility];  Service: Urology;  Laterality: Right;  . CYSTOSCOPY W/ URETERAL STENT PLACEMENT Bilateral 07/04/2016   Procedure: CYSTOSCOPY WITH STENT REPLACEMENT;  Surgeon: [name], MD;  Location: [facility];  Service: Urology;  Laterality: Bilateral;  . CYSTOSCOPY W/ URETERAL STENT PLACEMENT Right 09/14/2016   Procedure: CYSTOSCOPY WITH STENT REPLACEMENT;  Surgeon: [name], MD;  Location: [facility];  Service: Urology;  Laterality: Right;  . CYSTOSCOPY W/ URETERAL STENT PLACEMENT Right 11/21/2016   Procedure: CYSTOSCOPY WITH STENT REPLACEMENT;  Surgeon: [name], MD;  Location: [facility];  Service: Urology;  Laterality: Right;  . CYSTOSCOPY W/ URETERAL STENT PLACEMENT Right 01/25/2017   Procedure: CYSTOSCOPY WITH STENT REPLACEMENT;  Surgeon: [name], MD;  Location: [facility];  Service: Urology;  Laterality: Right;  . CYSTOSCOPY W/ URETERAL STENT PLACEMENT Right 03/27/2017   Procedure: CYSTOSCOPY WITH STENT REPLACEMENT;  Surgeon: [name], MD;  Location: [facility];  Service: Urology;  Laterality: Right;  . CYSTOSCOPY W/ URETERAL STENT PLACEMENT Right 05/31/2017   Procedure: CYSTOSCOPY WITH STENT REPLACEMENT;  Surgeon: [name], MD;  Location: [facility];  Service: Urology;  Laterality: Right;  . CYSTOSCOPY W/ URETERAL STENT PLACEMENT Right 08/02/2017   Procedure: CYSTOSCOPY WITH STENT REPLACEMENT;  Surgeon: [name], MD;  Location: [facility];  Service: Urology;  Laterality: Right;  . CYSTOSCOPY W/ URETERAL STENT PLACEMENT Right 10/29/2017   Procedure: CYSTOSCOPY WITH STENT REPLACEMENT;  Surgeon: Abbie Sons, MD;  Location: [facility];  Service: Urology;  Laterality: Right;  . CYSTOSCOPY W/ URETERAL STENT PLACEMENT Right 02/14/2018   Procedure: CYSTOSCOPY WITH RETROGRADE PYELOGRAM/URETERAL STENT Exchange;  Surgeon: Abbie Sons, MD;  Location: [facility];  Service: Urology;  Laterality: Right;  . CYSTOSCOPY W/ URETERAL STENT  PLACEMENT Right 03/18/2019   Procedure: CYSTOSCOPY WITH STENT REPLACEMENT;  Surgeon: Abbie Sons, MD;  Location: [facility];  Service: Urology;  Laterality: Right;  . CYSTOSCOPY W/ URETERAL STENT REMOVAL Left 09/14/2016   Procedure: CYSTOSCOPY WITH STENT REMOVAL;  Surgeon: [name], MD;  Location: [facility];  Service: Urology;  Laterality: Left;  . CYSTOSCOPY WITH STENT PLACEMENT Left 05/09/2016   Procedure: CYSTOSCOPY WITH STENT PLACEMENT;  Surgeon: [name], MD;  Location: [facility];  Service: Urology;  Laterality: Left;  . CYSTOSCOPY WITH STENT PLACEMENT Right 07/22/2018   Procedure: CYSTOSCOPY WITH STENT Exchange;  Surgeon: Abbie Sons, MD;  Location: [facility];  Service: Urology;  Laterality: Right;  . EYE SURGERY Left 2018   tear duct reconstruction  . HERNIA REPAIR  1884   umbilical  . JOINT REPLACEMENT Left 2013   TKR  . JOINT REPLACEMENT Right 2011   TKR  . LAPAROSCOPIC HYSTERECTOMY    . REDUCTION MAMMAPLASTY    . REPLACEMENT TOTAL KNEE Bilateral   . SKIN CANCER EXCISION Left 2018   lower eye lid  . URETEROSCOPY Bilateral 05/09/2016   Procedure: URETEROSCOPY;  Surgeon: [name],  MD;  Location: [facility];  Service: Urology;  Laterality: Bilateral;  . URETEROSCOPY WITH HOLMIUM LASER LITHOTRIPSY Left 07/04/2016   Procedure: URETEROSCOPY WITH HOLMIUM LASER LITHOTRIPSY;  Surgeon: [name], MD;  Location: [facility];  Service: Urology;  Laterality: Left;    Home Medications:  . albuterol (PROVENTIL HFA;VENTOLIN HFA) 108 (90 Base) MCG/ACT inhaler Inhale 2 puffs into the lungs every 4 (four) hours as needed for wheezing or shortness of breath.    Marland Kitchen alendronate (FOSAMAX) 70 MG tablet Take 70 mg by mouth every Friday.     Marland Kitchen aspirin EC 81 MG tablet Take 81 mg by mouth daily.     Marland Kitchen atorvastatin (LIPITOR) 40 MG tablet Take 40 mg by mouth daily.     Marland Kitchen buPROPion (WELLBUTRIN XL) 150 MG 24 hr tablet Take 1 tablet (150 mg total) by mouth daily. 90  tablet 3  . furosemide (LASIX) 20 MG tablet Take 20 mg by mouth daily.     Marland Kitchen gabapentin (NEURONTIN) 100 MG capsule Take 100 mg by mouth 2 (two) times daily.   0  . insulin lispro (HUMALOG) 100 UNIT/ML KiwkPen Inject 12 Units into the skin 3 (three) times daily before meals.     Marland Kitchen LANTUS SOLOSTAR 100 UNIT/ML Solostar Pen Inject 35 Units into the skin at bedtime.     Marland Kitchen levothyroxine (SYNTHROID, LEVOTHROID) 75 MCG tablet Take 75 mcg by mouth daily before breakfast.    . metFORMIN (GLUCOPHAGE) 1000 MG tablet Take 1,000 mg by mouth 2 (two) times daily with a meal.    . ONETOUCH ULTRA test strip     . pantoprazole (PROTONIX) 40 MG tablet Take 40 mg by mouth daily.     . QUEtiapine (SEROQUEL) 25 MG tablet Take 1 tablet (25 mg total) by mouth at bedtime. 90 tablet 3  . rivastigmine (EXELON) 1.5 MG capsule Take 1 capsule (1.5 mg total) by mouth 2 (two) times daily. 60 capsule 11  . telmisartan (MICARDIS) 40 MG tablet Take 40 mg by mouth daily.     . traMADol (ULTRAM) 50 MG tablet     . umeclidinium-vilanterol (ANORO ELLIPTA) 62.5-25 MCG/INH AEPB Inhale 1 puff into the lungs daily.    . vitamin B-12 (CYANOCOBALAMIN) 1000 MCG tablet Take 1,000 mcg by mouth daily.      Allergies:  Allergies  Allergen Reactions  . Ibuprofen Itching, Nausea Only and Rash    Family History: Family History  Problem Relation Age of Onset  . Liver cancer Mother   . Colon cancer Mother   . Breast cancer Mother 59  . Diabetes Daughter   . Kidney disease Daughter        adrenal tumors  . Breast cancer Maternal Aunt   . Kidney cancer Neg Hx   . Prostate cancer Neg Hx     Social History:  reports that she has never smoked. She has never used smokeless tobacco. She reports that she does not drink alcohol and does not use drugs.   Physical Exam: Pulse 88   Temp 97.8 F (36.6 C) (Temporal)   Resp 20   SpO2 100%   Constitutional:  Alert, No acute distress. HEENT: [place] AT, moist mucus  membranes.  Trachea midline, no masses. Cardiovascular: No clubbing, cyanosis, or edema.  RRR Respiratory: Normal respiratory effort, no increased work of breathing.  Clear GI: Abdomen is soft, nontender, nondistended, no abdominal masses GU: No CVA tenderness Lymph: No cervical or inguinal lymphadenopathy. Skin: No rashes, bruises or suspicious lesions.  Neurologic: Grossly intact, no focal deficits, moving all 4 extremities.   Assessment & Plan:    1.  Right UPJ obstruction  She presents today for cystoscopy with stent exchange.  The indications and interest plan procedure discussed as well as potential risks of bleeding, ureteral injury and infection/sepsis.  Discussed with patient and her daughter and they desire to proceed.   Abbie Sons, [place] [address] [place], [place] [address] [phone]

## 2020-03-08 NOTE — Op Note (Signed)
Preoperative diagnosis:  1. Right UPJ obstruction  Postoperative diagnosis:  1. Same  Procedure:  1. Cystoscopy 2. Right ureteral stent exchange (Bard Optima 6FR/22 cm) 3. Right retrograde pyelography with interpretation   Surgeon: Lori Blanchard, M.D.  Anesthesia: General  Complications: None  Intraoperative findings:   Right retrograde pyelogram-moderate right hydronephrosis secondary to UPJ obstruction  EBL: None  Specimens: None  Indication: Lori Blanchard is a 74 y.o. patient with a symptomatic right UPJ obstruction managed with stent exchange since 2014.  Stent was last exchanged 1 year ago. After reviewing the management options for treatment, he elected to proceed with the above surgical procedure(s). We have discussed the potential benefits and risks of the procedure, side effects of the proposed treatment, the likelihood of the patient achieving the goals of the procedure, and any potential problems that might occur during the procedure or recuperation. Informed consent has been obtained.  Description of procedure:  The patient was taken to the operating room and general anesthesia was induced.  The patient was placed in the dorsal lithotomy position, prepped and draped in the usual sterile fashion, and preoperative antibiotics were administered. A preoperative time-out was performed.   A 21 French cystoscope was lubricated and passed per urethra.  The bladder was systematically examined in its entirety. There was no evidence for any bladder tumors, stones, or other mucosal pathology.  The indwelling ureteral stent showed no encrustation.  Attention was then directed to the right ureteral orifice and a 0.038 Sensor wire was placed in the right ureteral orifice alongside the indwelling stent and advanced to the renal pelvis under fluoroscopic guidance.  The cystoscope was removed, repassed and the indwelling ureteral stent was grasped with endoscopic forceps and removed  without difficulty.  A 5 French open-ended ureteral catheter was then placed over the guidewire and right retrograde pyelogram was performed with findings as described above.  The guidewire was removed and a Bard Optima 6FR/22 cm was placed under fluoroscopic guidance without difficulty.  There was good curl seen in the renal pelvis and bladder under fluoroscopy.  The cystoscope was repassed to verify bladder position.  The bladder was emptied and the cystoscope was removed.  The patient appeared to tolerate the procedure well and without complications.  She was transferred to the PACU in satisfactory condition.   Plan: The stent was not encrusted and will plan annual stent exchange   [name], MD

## 2020-03-08 NOTE — OR Nursing (Signed)
6x22 stent removed from the right ureter

## 2020-03-08 NOTE — Transfer of Care (Signed)
Immediate Anesthesia Transfer of Care Note  Patient: Lori Blanchard  Procedure(s) Performed: CYSTOSCOPY WITH STENT Exchange (Right )  Patient Location: PACU  Anesthesia Type:MAC  Level of Consciousness: sedated  Airway & Oxygen Therapy: Patient Spontanous Breathing and Patient connected to face mask oxygen  Post-op Assessment: Report given to RN and Post -op Vital signs reviewed and stable  Post vital signs: Reviewed and stable  Last Vitals:  Vitals Value Taken Time  BP 92/55 03/08/20 0939  Temp 36.1 C 03/08/20 0939  Pulse 72 03/08/20 0941  Resp 22 03/08/20 0941  SpO2 100 % 03/08/20 0941  Vitals shown include unvalidated device data.  Last Pain:  Vitals:   03/08/20 0737  TempSrc: Temporal  PainSc: 0-No pain         Complications: No complications documented.

## 2020-03-08 NOTE — Interval H&P Note (Signed)
History and Physical Interval Note:  03/08/2020 8:52 AM  [place]  has presented today for surgery, with the diagnosis of right ureteropelvic junction obstruction.  The various methods of treatment have been discussed with the patient and family. After consideration of risks, benefits and other options for treatment, the patient has consented to  Procedure(s): CYSTOSCOPY WITH STENT Exchange (Right) as a surgical intervention.  The patient's history has been reviewed, patient examined, no change in status, stable for surgery.  I have reviewed the patient's chart and labs.  Questions were answered to the patient's satisfaction.     [place]

## 2020-03-09 ENCOUNTER — Encounter: Payer: Self-pay | Admitting: Urology

## 2020-03-09 NOTE — Anesthesia Postprocedure Evaluation (Signed)
Anesthesia Post Note  Patient: Lori Blanchard  Procedure(s) Performed: CYSTOSCOPY WITH STENT Exchange (Right )  Patient location during evaluation: PACU Anesthesia Type: General Level of consciousness: awake and alert and oriented Pain management: pain level controlled Vital Signs Assessment: post-procedure vital signs reviewed and stable Respiratory status: spontaneous breathing Cardiovascular status: blood pressure returned to baseline Anesthetic complications: no   No complications documented.   Last Vitals:  Vitals:   03/08/20 1018 03/08/20 1034  BP: 103/64 (!) 114/47  Pulse: 76 73  Resp: 16 18  Temp:  (!) 36.3 C  SpO2: 98% 97%    Last Pain:  Vitals:   03/08/20 1034  TempSrc: Temporal  PainSc: 0-No pain                 Malka Bocek

## 2020-04-05 ENCOUNTER — Telehealth: Payer: Self-pay

## 2020-04-05 ENCOUNTER — Other Ambulatory Visit: Payer: Self-pay

## 2020-04-05 ENCOUNTER — Ambulatory Visit: Payer: Medicare Other | Admitting: Physician Assistant

## 2020-04-05 ENCOUNTER — Encounter: Payer: Self-pay | Admitting: Physician Assistant

## 2020-04-05 VITALS — BP 123/79 | HR 96 | Ht <= 58 in | Wt 138.0 lb

## 2020-04-05 DIAGNOSIS — N135 Crossing vessel and stricture of ureter without hydronephrosis: Secondary | ICD-10-CM | POA: Diagnosis not present

## 2020-04-05 DIAGNOSIS — R109 Unspecified abdominal pain: Secondary | ICD-10-CM

## 2020-04-05 LAB — URINALYSIS, COMPLETE
Bilirubin, UA: NEGATIVE
Glucose, UA: NEGATIVE
Ketones, UA: NEGATIVE
Nitrite, UA: NEGATIVE
Protein,UA: NEGATIVE
RBC, UA: NEGATIVE
Specific Gravity, UA: 1.01 (ref 1.005–1.030)
Urobilinogen, Ur: 0.2 mg/dL (ref 0.2–1.0)
pH, UA: 7 (ref 5.0–7.5)

## 2020-04-05 LAB — MICROSCOPIC EXAMINATION
Bacteria, UA: NONE SEEN
RBC, Urine: NONE SEEN /hpf (ref 0–2)

## 2020-04-05 MED ORDER — TROSPIUM CHLORIDE 20 MG PO TABS: 20.0000 mg | ORAL_TABLET | Freq: Every day | ORAL | 2 refills | Status: AC

## 2020-04-05 NOTE — Progress Notes (Signed)
04/05/2020 4:24 PM   Lori Blanchard Mar 18, 1946 [id number]  CC: Chief Complaint  Patient presents with  . Routine Post Op    HPI: Lori Blanchard is a 74 y.o. 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 Bernardo Heater on 03/08/2020 who presents for postop follow-up.  Today she reports intermittent right flank pain that radiates to the left flank.  She denies dysuria, urgency, frequency, gross hematuria, nausea, vomiting, fever, or chills.  She reports that she is typically sore for 1 to 2 days after stent exchange, but has not had lingering symptoms like these with prior exchanges.  In-office UA today positive for trace leukocyte esterase; urine microscopy with 6-10 WBCs/HPF.  PMH: Past Medical History:  Diagnosis Date  . Alzheimer disease ([place])   . Anxiety   . Anxiety and depression   . Asthma   . Cancer ([place])    Basal Cell Skin Cancer/ LUNG/RADIATION LAST 8/20  . CHF (congestive heart failure) ([place])    patient denies this diagnosis  . Complication of anesthesia    2/20 oxygen and stayed overnight  . COPD (chronic obstructive pulmonary disease) ([place])   . Depression   . Diabetes ([place])   . Diverticulosis    patient denies problems for this  . Dyspnea   . Elevated lipids   . Fatty liver   . GERD (gastroesophageal reflux disease)   . Gout    no medication at this time  . History of kidney stones   . Hydronephrosis    stage III ckd  . Hyperlipemia   . Hypertension   . Hypothyroidism   . Lower extremity edema    comes and goes  . Osteoarthritis   . Osteoarthritis   . Osteoporosis   . Pulmonary nodule    has been followed for years, not cancerous. causes breathing issues  . Retroperitoneal fibrosis   . Sleep apnea    CPAP  . Ureter injury   . Ureteropelvic junction (UPJ) obstruction    requires change of ureteral stent approximately every 2 to 3 months    Surgical  History: Past Surgical History:  Procedure Laterality Date  . ABDOMINAL HYSTERECTOMY     partial  . ANKLE FRACTURE SURGERY Left    pins in ankle but not in correct location  . APPENDECTOMY  1965  . BREAST SURGERY Bilateral 1990   Reduction  . c setion    . CARPAL TUNNEL RELEASE Bilateral 2000  . CESAREAN SECTION  2751,7001, 1969   x 3  . CYSTOSCOPY W/ RETROGRADES Right 11/02/2015   Procedure: CYSTOSCOPY WITH RETROGRADE PYELOGRAM, URETERAL STENT EXCHANGE;  Surgeon: [name], MD;  Location: [facility];  Service: Urology;  Laterality: Right;  . CYSTOSCOPY W/ RETROGRADES Right 01/31/2016   Procedure: CYSTOSCOPY WITH RETROGRADE PYELOGRAM;  Surgeon: [name], MD;  Location: [facility];  Service: Urology;  Laterality: Right;  . CYSTOSCOPY W/ RETROGRADES Bilateral 05/09/2016   Procedure: CYSTOSCOPY WITH RETROGRADE PYELOGRAM;  Surgeon: [name], MD;  Location: [facility];  Service: Urology;  Laterality: Bilateral;  . CYSTOSCOPY W/ RETROGRADES Left 11/21/2016   Procedure: CYSTOSCOPY WITH RETROGRADE PYELOGRAM;  Surgeon: [name], MD;  Location: [facility];  Service: Urology;  Laterality: Left;  . CYSTOSCOPY W/ RETROGRADES Right 03/27/2017   Procedure: CYSTOSCOPY WITH RETROGRADE PYELOGRAM;  Surgeon: [name], MD;  Location: [facility];  Service: Urology;  Laterality: Right;  .  CYSTOSCOPY W/ RETROGRADES Bilateral 05/31/2017   Procedure: CYSTOSCOPY WITH RETROGRADE PYELOGRAM;  Surgeon: [name], MD;  Location: [facility];  Service: Urology;  Laterality: Bilateral;  . CYSTOSCOPY W/ RETROGRADES Right 07/22/2018   Procedure: CYSTOSCOPY WITH RETROGRADE PYELOGRAM;  Surgeon: Abbie Sons, MD;  Location: [facility];  Service: Urology;  Laterality: Right;  . CYSTOSCOPY W/ URETERAL STENT PLACEMENT Right 05/11/2015   Procedure: CYSTOSCOPY WITH STENT REPLACEMENT;  Surgeon: [name], MD;  Location: [facility];  Service: Urology;  Laterality: Right;  .  CYSTOSCOPY W/ URETERAL STENT PLACEMENT Right 01/31/2016   Procedure: CYSTOSCOPY, RETROGRADE PYELOGRAMS WITH STENT REPLACEMENT;  Surgeon: [name], MD;  Location: [facility];  Service: Urology;  Laterality: Right;  . CYSTOSCOPY W/ URETERAL STENT PLACEMENT Right 05/09/2016   Procedure: CYSTOSCOPY WITH STENT REPLACEMENT;  Surgeon: [name], MD;  Location: [facility];  Service: Urology;  Laterality: Right;  . CYSTOSCOPY W/ URETERAL STENT PLACEMENT Bilateral 07/04/2016   Procedure: CYSTOSCOPY WITH STENT REPLACEMENT;  Surgeon: [name], MD;  Location: [facility];  Service: Urology;  Laterality: Bilateral;  . CYSTOSCOPY W/ URETERAL STENT PLACEMENT Right 09/14/2016   Procedure: CYSTOSCOPY WITH STENT REPLACEMENT;  Surgeon: [name], MD;  Location: [facility];  Service: Urology;  Laterality: Right;  . CYSTOSCOPY W/ URETERAL STENT PLACEMENT Right 11/21/2016   Procedure: CYSTOSCOPY WITH STENT REPLACEMENT;  Surgeon: [name], MD;  Location: [facility];  Service: Urology;  Laterality: Right;  . CYSTOSCOPY W/ URETERAL STENT PLACEMENT Right 01/25/2017   Procedure: CYSTOSCOPY WITH STENT REPLACEMENT;  Surgeon: [name], MD;  Location: [facility];  Service: Urology;  Laterality: Right;  . CYSTOSCOPY W/ URETERAL STENT PLACEMENT Right 03/27/2017   Procedure: CYSTOSCOPY WITH STENT REPLACEMENT;  Surgeon: [name], MD;  Location: [facility];  Service: Urology;  Laterality: Right;  . CYSTOSCOPY W/ URETERAL STENT PLACEMENT Right 05/31/2017   Procedure: CYSTOSCOPY WITH STENT REPLACEMENT;  Surgeon: [name], MD;  Location: [facility];  Service: Urology;  Laterality: Right;  . CYSTOSCOPY W/ URETERAL STENT PLACEMENT Right 08/02/2017   Procedure: CYSTOSCOPY WITH STENT REPLACEMENT;  Surgeon: [name], MD;  Location: [facility];  Service: Urology;  Laterality: Right;  . CYSTOSCOPY W/ URETERAL STENT PLACEMENT Right 10/29/2017   Procedure: CYSTOSCOPY WITH STENT  REPLACEMENT;  Surgeon: Abbie Sons, MD;  Location: [facility];  Service: Urology;  Laterality: Right;  . CYSTOSCOPY W/ URETERAL STENT PLACEMENT Right 02/14/2018   Procedure: CYSTOSCOPY WITH RETROGRADE PYELOGRAM/URETERAL STENT Exchange;  Surgeon: Abbie Sons, MD;  Location: [facility];  Service: Urology;  Laterality: Right;  . CYSTOSCOPY W/ URETERAL STENT PLACEMENT Right 03/18/2019   Procedure: CYSTOSCOPY WITH STENT REPLACEMENT;  Surgeon: Abbie Sons, MD;  Location: [facility];  Service: Urology;  Laterality: Right;  . CYSTOSCOPY W/ URETERAL STENT REMOVAL Left 09/14/2016   Procedure: CYSTOSCOPY WITH STENT REMOVAL;  Surgeon: [name], MD;  Location: [facility];  Service: Urology;  Laterality: Left;  . CYSTOSCOPY WITH STENT PLACEMENT Left 05/09/2016   Procedure: CYSTOSCOPY WITH STENT PLACEMENT;  Surgeon: [name], MD;  Location: [facility];  Service: Urology;  Laterality: Left;  . CYSTOSCOPY WITH STENT PLACEMENT Right 07/22/2018   Procedure: CYSTOSCOPY WITH STENT Exchange;  Surgeon: Abbie Sons, MD;  Location: [facility];  Service: Urology;  Laterality: Right;  . CYSTOSCOPY WITH STENT PLACEMENT Right 03/08/2020   Procedure: CYSTOSCOPY WITH STENT Exchange;  Surgeon: Abbie Sons, MD;  Location: [facility];  Service: Urology;  Laterality: Right;  . EYE SURGERY Left 2018   tear duct reconstruction  . HERNIA REPAIR  4967   umbilical  . JOINT REPLACEMENT Left 2013   TKR  . JOINT REPLACEMENT Right 2011   TKR  . LAPAROSCOPIC HYSTERECTOMY    . REDUCTION MAMMAPLASTY    . REPLACEMENT TOTAL KNEE Bilateral   . SKIN CANCER EXCISION Left 2018   lower eye lid  . URETEROSCOPY Bilateral 05/09/2016   Procedure: URETEROSCOPY;  Surgeon: [name], MD;  Location: [facility];  Service: Urology;  Laterality: Bilateral;  . URETEROSCOPY WITH HOLMIUM LASER LITHOTRIPSY Left 07/04/2016   Procedure: URETEROSCOPY WITH HOLMIUM LASER LITHOTRIPSY;  Surgeon: [name], MD;  Location:  [facility];  Service: Urology;  Laterality: Left;    Home Medications:  Allergies as of 04/05/2020      Reactions   Ibuprofen Itching, Nausea Only, Rash      Medication List       Accurate as of April 05, 2020  4:24 PM. If you have any questions, ask your nurse or doctor.        albuterol 108 (90 Base) MCG/ACT inhaler Commonly known as: VENTOLIN HFA Inhale 2 puffs into the lungs every 4 (four) hours as needed for wheezing or shortness of breath.   alendronate 70 MG tablet Commonly known as: FOSAMAX Take 70 mg by mouth every Friday.   Anoro Ellipta 62.5-25 MCG/INH Aepb Generic drug: umeclidinium-vilanterol Inhale 1 puff into the lungs daily.   atorvastatin 40 MG tablet Commonly known as: LIPITOR Take 40 mg by mouth daily.   buPROPion 150 MG 24 hr tablet Commonly known as: Wellbutrin XL Take 1 tablet (150 mg total) by mouth daily.   Colchicine 0.6 MG Caps Take 0.6 mg by mouth at bedtime.   furosemide 20 MG tablet Commonly known as: LASIX Take 20 mg by mouth daily.   gabapentin 100 MG capsule Commonly known as: NEURONTIN Take 100 mg by mouth 2 (two) times daily.   HYDROcodone-acetaminophen 5-325 MG tablet Commonly known as: NORCO/VICODIN Take 0.5-1 tablets by mouth every 6 (six) hours as needed for moderate pain.   insulin lispro 100 UNIT/ML KiwkPen Commonly known as: HUMALOG Inject 12 Units into the skin 3 (three) times daily before meals.   Lantus SoloStar 100 UNIT/ML Solostar Pen Generic drug: insulin glargine Inject 35 Units into the skin at bedtime.   levothyroxine 75 MCG tablet Commonly known as: SYNTHROID Take 75 mcg by mouth daily before breakfast.   metFORMIN 1000 MG tablet Commonly known as: GLUCOPHAGE Take 1,000 mg by mouth 2 (two) times daily with a meal.   mirtazapine 7.5 MG tablet Commonly known as: REMERON Take 7.5 mg by mouth at bedtime.   OneTouch Ultra test strip Generic drug: glucose blood   pantoprazole 40 MG tablet Commonly  known as: PROTONIX Take 40 mg by mouth daily.   QUEtiapine 25 MG tablet Commonly known as: SEROQUEL Take 2 tablets every evening   telmisartan 40 MG tablet Commonly known as: MICARDIS Take 40 mg by mouth daily.   trospium 20 MG tablet Commonly known as: SANCTURA Take 1 tablet (20 mg total) by mouth daily. Started by: Debroah Loop, PA-C   vitamin B-12 1000 MCG tablet Commonly known as: CYANOCOBALAMIN Take 1,000 mcg by mouth daily.       Allergies:  Allergies  Allergen Reactions  . Ibuprofen Itching, Nausea Only and Rash    Family History: Family History  Problem Relation Age of Onset  .  Liver cancer Mother   . Colon cancer Mother   . Breast cancer Mother 72  . Diabetes Daughter   . Kidney disease Daughter        adrenal tumors  . Breast cancer Maternal Aunt   . Kidney cancer Neg Hx   . Prostate cancer Neg Hx     Social History:   reports that she has never smoked. She has never used smokeless tobacco. She reports that she does not drink alcohol and does not use drugs.  Physical Exam: BP 123/79 (BP Location: Left Arm, Patient Position: Sitting, Cuff Size: Normal)   Pulse 96   Ht 4\' 6"  (1.372 m)   Wt 138 lb (62.6 kg)   BMI 33.27 kg/m   Constitutional:  Alert and oriented, no acute distress, nontoxic appearing HEENT: [place], AT Cardiovascular: No clubbing, cyanosis, or edema Respiratory: Normal respiratory effort, no increased work of breathing Skin: No rashes, bruises or suspicious lesions Neurologic: Grossly intact, no focal deficits, moving all 4 extremities Psychiatric: Normal mood and affect  Laboratory Data: Results for orders placed or performed in visit on 04/05/20  Microscopic Examination   Urine  Result Value Ref Range   WBC, UA 6-10 (A) 0 - 5 /hpf   RBC None seen 0 - 2 /hpf   Epithelial Cells (non renal) 0-10 0 - 10 /hpf   Bacteria, UA None seen None seen/Few  Urinalysis, Complete  Result Value Ref Range   Specific Gravity, UA 1.010  1.005 - 1.030   pH, UA 7.0 5.0 - 7.5   Color, UA Yellow Yellow   Appearance Ur Clear Clear   Leukocytes,UA Trace (A) Negative   Protein,UA Negative Negative/Trace   Glucose, UA Negative Negative   Ketones, UA Negative Negative   RBC, UA Negative Negative   Bilirubin, UA Negative Negative   Urobilinogen, Ur 0.2 0.2 - 1.0 mg/dL   Nitrite, UA Negative Negative   Microscopic Examination See below:    Assessment & Plan:   1. Ureteropelvic junction (UPJ) obstruction S/p right ureteral stent exchange.  Will proceed with exchanges on an annual basis per Dr. Bernardo Heater.  2. Right flank pain Likely stent discomfort, however UA notable for mild pyuria today consistent with postoperative inflammation versus mild infection.  Will send urine for culture to rule out infection.  I offered her trospium 20 mg daily for management of right ureteral spasm.  I do not recommend other anticholinergics given her history of dementia. - Urinalysis, Complete - CULTURE, URINE COMPREHENSIVE - trospium (SANCTURA) 20 MG tablet; Take 1 tablet (20 mg total) by mouth daily.  Dispense: 30 tablet; Refill: 2   Return in about 4 weeks (around 05/03/2020) for Symptom recheck with PVR.  Debroah Loop, PA-C  [facility] [address], [place], [place] [address] [phone]

## 2020-04-05 NOTE — Telephone Encounter (Signed)
Pt calls and LM on triage line stating that the medication sent in for her today is not covered by her insurance company. The pharmacy will send a list of medication alternatives that are covered.

## 2020-04-05 NOTE — Telephone Encounter (Signed)
Alternative anticholinergics are contraindicated in this patient with a history of dementia. I recommend offering her a 1 month trial of Myrbetriq 50mg  daily with follow-up as planned. Please offer her samples from clinic.

## 2020-04-05 NOTE — Patient Instructions (Signed)
I think you are having spasms in your ureter around your stent that are causing your pain. I have sent a medication, trospium, to your pharmacy today to help with this. I would like to see you back in clinic in 1 month to check on your symptoms on this medication and scan your bladder to make sure you are emptying well.  I have sent your urine for culture today and will call you if I need to start you on antibiotics.

## 2020-04-06 NOTE — Telephone Encounter (Signed)
Attempted to contact patient. PA initiated for [facility] 9/1.

## 2020-04-08 LAB — CULTURE, URINE COMPREHENSIVE

## 2020-04-12 ENCOUNTER — Telehealth: Payer: Self-pay | Admitting: Physician Assistant

## 2020-04-12 NOTE — Telephone Encounter (Signed)
Please contact the patient and inform her that her urine culture has come back negative; she does not have an infection. I believe her symptoms are due to ureteral spasms, as discussed. Myrbetriq should help.

## 2020-04-12 NOTE — Telephone Encounter (Signed)
ERROR: PA initiated for Trospium 9/1.

## 2020-04-12 NOTE — Telephone Encounter (Signed)
Notified patient as advised. Patients daughter will be coming by to pick up Myrbetriq samples as stated in previous note.

## 2020-05-05 ENCOUNTER — Ambulatory Visit: Payer: Self-pay | Admitting: Physician Assistant

## 2020-05-10 ENCOUNTER — Other Ambulatory Visit: Payer: Self-pay | Admitting: *Deleted

## 2020-05-10 DIAGNOSIS — R918 Other nonspecific abnormal finding of lung field: Secondary | ICD-10-CM

## 2020-05-20 ENCOUNTER — Ambulatory Visit
Admission: RE | Admit: 2020-05-20 | Discharge: 2020-05-20 | Disposition: A | Discharge status: Home / Self Care | Payer: Medicare Other | Source: Ambulatory Visit | Attending: Radiation Oncology | Admitting: Radiation Oncology

## 2020-05-20 ENCOUNTER — Other Ambulatory Visit: Payer: Self-pay

## 2020-05-20 DIAGNOSIS — R918 Other nonspecific abnormal finding of lung field: Secondary | ICD-10-CM | POA: Insufficient documentation

## 2020-05-20 LAB — POCT I-STAT CREATININE: Creatinine, Ser: 1.3 mg/dL — ABNORMAL HIGH (ref 0.44–1.00)

## 2020-05-20 MED ORDER — IOHEXOL 300 MG/ML  SOLN: 75.0000 mL | Freq: Once | INTRAMUSCULAR | Status: DC | PRN

## 2020-05-20 MED ORDER — IOHEXOL 300 MG/ML  SOLN
60.0000 mL | Freq: Once | INTRAMUSCULAR | Status: AC | PRN
  Administered 2020-05-20: 60 mL via INTRAVENOUS

## 2020-05-24 ENCOUNTER — Encounter: Payer: Self-pay | Admitting: Radiation Oncology

## 2020-05-25 ENCOUNTER — Ambulatory Visit
Admission: RE | Admit: 2020-05-25 | Discharge: 2020-05-25 | Disposition: A | Discharge status: Home / Self Care | Payer: Medicare Other | Source: Ambulatory Visit | Attending: Radiation Oncology | Admitting: Radiation Oncology

## 2020-05-25 ENCOUNTER — Other Ambulatory Visit: Payer: Self-pay

## 2020-05-25 ENCOUNTER — Encounter: Payer: Self-pay | Admitting: Radiation Oncology

## 2020-05-25 VITALS — BP 149/79 | HR 91 | Temp 96.4°F | Wt 136.1 lb

## 2020-05-25 DIAGNOSIS — R918 Other nonspecific abnormal finding of lung field: Secondary | ICD-10-CM

## 2020-05-25 NOTE — Progress Notes (Signed)
Radiation Oncology Follow up Note  Name: Lori Blanchard   Date:   05/25/2020 MRN:  [id number] DOB: 1945-10-17    This 74 y.o. female presents to the clinic today for 6-month follow-up status post SBRT to right lower lobe for presumed non-small cell lung cancer.  REFERRING PROVIDER: [name], MD  HPI: Patient is a 74 year old female now out 14 months having completed SBRT to her right lower lobe for presumed non-small cell lung cancer.  She is seen today in routine follow-up is doing fairly well still oxygen dependent.  She has a mild nonproductive cough no and continues to have shortness of breath and dyspnea on exertion.  She is swallowing well her p.o. intake is good..  She had a recent CT scan this month showing increased rounded confluent opacity in right lower lobe in the area of posttreatment changes local recurrence of carcinoma cannot be excluded.  I have reviewed those films.  COMPLICATIONS OF TREATMENT: none  FOLLOW UP COMPLIANCE: keeps appointments   PHYSICAL EXAM:  BP (!) 149/79   Pulse 91   Temp (!) 96.4 F (35.8 C) (Tympanic)   Wt 136 lb 1.6 oz (61.7 kg)   BMI 32.82 kg/m  Frail-appearing elderly female on nasal oxygen in NAD.  Well-developed well-nourished patient in NAD. HEENT reveals PERLA, EOMI, discs not visualized.  Oral cavity is clear. No oral mucosal lesions are identified. Neck is clear without evidence of cervical or supraclavicular adenopathy. Lungs are clear to A&P. Cardiac examination is essentially unremarkable with regular rate and rhythm without murmur rub or thrill. Abdomen is benign with no organomegaly or masses noted. Motor sensory and DTR levels are equal and symmetric in the upper and lower extremities. Cranial nerves II through XII are grossly intact. Proprioception is intact. No peripheral adenopathy or edema is identified. No motor or sensory levels are noted. Crude visual fields are within normal range.  RADIOLOGY RESULTS: CT scans  reviewed compatible with above-stated findings  PLAN: At the present time I have ordered a PET CT scan for better clarification of what is developing in her right lower lobe.  My review these can all be radiation changes.  I have ordered a PET CT scan with follow-up shortly after.  Should there be an area of hypermetabolic activity amenable to biopsy would order that at that time.  Patient and daughter both comprehend my recommendations well.  I would like to take this opportunity to thank you for allowing me to participate in the care of your patient.Lori Filbert, MD

## 2020-06-01 ENCOUNTER — Encounter
Admission: RE | Admit: 2020-06-01 | Discharge: 2020-06-01 | Disposition: A | Discharge status: Home / Self Care | Payer: Medicare Other | Source: Ambulatory Visit | Attending: Radiation Oncology | Admitting: Radiation Oncology

## 2020-06-01 ENCOUNTER — Other Ambulatory Visit: Payer: Self-pay

## 2020-06-01 DIAGNOSIS — I700 Atherosclerosis of aorta: Secondary | ICD-10-CM | POA: Diagnosis not present

## 2020-06-01 DIAGNOSIS — I2510 Atherosclerotic heart disease of native coronary artery without angina pectoris: Secondary | ICD-10-CM | POA: Insufficient documentation

## 2020-06-01 DIAGNOSIS — J439 Emphysema, unspecified: Secondary | ICD-10-CM | POA: Diagnosis not present

## 2020-06-01 DIAGNOSIS — R918 Other nonspecific abnormal finding of lung field: Secondary | ICD-10-CM | POA: Diagnosis present

## 2020-06-01 DIAGNOSIS — I517 Cardiomegaly: Secondary | ICD-10-CM | POA: Diagnosis not present

## 2020-06-01 LAB — GLUCOSE, CAPILLARY: Glucose-Capillary: 123 mg/dL — ABNORMAL HIGH (ref 70–99)

## 2020-06-01 MED ORDER — FLUDEOXYGLUCOSE F - 18 (FDG) INJECTION
7.0000 | Freq: Once | INTRAVENOUS | Status: AC | PRN
  Administered 2020-06-01: 7.001 via INTRAVENOUS

## 2020-06-03 ENCOUNTER — Ambulatory Visit: Payer: Medicare Other | Admitting: Radiation Oncology

## 2020-06-06 ENCOUNTER — Encounter: Payer: Self-pay | Admitting: Radiation Oncology

## 2020-06-06 ENCOUNTER — Other Ambulatory Visit: Payer: Self-pay

## 2020-06-06 ENCOUNTER — Ambulatory Visit
Admission: RE | Admit: 2020-06-06 | Discharge: 2020-06-06 | Disposition: A | Discharge status: Home / Self Care | Payer: Medicare Other | Source: Ambulatory Visit | Attending: Radiation Oncology | Admitting: Radiation Oncology

## 2020-06-06 VITALS — BP 154/93 | HR 84 | Temp 96.3°F | Resp 18 | Wt 136.5 lb

## 2020-06-06 DIAGNOSIS — R918 Other nonspecific abnormal finding of lung field: Secondary | ICD-10-CM | POA: Insufficient documentation

## 2020-06-06 DIAGNOSIS — Z923 Personal history of irradiation: Secondary | ICD-10-CM | POA: Insufficient documentation

## 2020-06-06 NOTE — Progress Notes (Signed)
Radiation Oncology Follow up Note  Name: Lori Blanchard   Date:   06/06/2020 MRN:  [id number] DOB: 07/18/1946    This 74 y.o. female presents to the clinic today for 41-month follow-up status post SBRT to her right lower lobe for presumed non-small cell lung cancer.  REFERRING PROVIDER: [name], MD  HPI: Patient is a 74 year old female I recently saw in follow-up at which time she has CT scan showing increased rounded confluent opacity in the right lower lobe and area of posttreatment changes with local recurrence cannot be excluded. I ordered a PET CT scan. Showing low-level activity in the vicinity of the right lower lobe consolidation minimally above the blood pool. Currently no evidence of recurrent or active malignancy was noted. She continues to do fairly well no significant change in her performance status.  COMPLICATIONS OF TREATMENT: none  FOLLOW UP COMPLIANCE: keeps appointments   PHYSICAL EXAM:  BP (!) 154/93 (BP Location: Left Arm, Patient Position: Sitting)   Pulse 84   Temp (!) 96.3 F (35.7 C) (Tympanic)   Resp 18   Wt 136 lb 8 oz (61.9 kg)   BMI 32.91 kg/m  Well-developed well-nourished patient in NAD. HEENT reveals PERLA, EOMI, discs not visualized.  Oral cavity is clear. No oral mucosal lesions are identified. Neck is clear without evidence of cervical or supraclavicular adenopathy. Lungs are clear to A&P. Cardiac examination is essentially unremarkable with regular rate and rhythm without murmur rub or thrill. Abdomen is benign with no organomegaly or masses noted. Motor sensory and DTR levels are equal and symmetric in the upper and lower extremities. Cranial nerves II through XII are grossly intact. Proprioception is intact. No peripheral adenopathy or edema is identified. No motor or sensory levels are noted. Crude visual fields are within normal range.  RADIOLOGY RESULTS: PET CT scans reviewed  PLAN: At this time I believe we are dealing with radiation  changes with no evidence of active malignancy. Of asked to see her back in 6 months I'll obtain a CT scan of her chest around that time. Patient and family know to call sooner with any concerns.  I would like to take this opportunity to thank you for allowing me to participate in the care of your patient.Noreene Filbert, MD

## 2020-06-13 ENCOUNTER — Encounter: Payer: Self-pay | Admitting: Neurology

## 2020-06-13 ENCOUNTER — Other Ambulatory Visit: Payer: Self-pay

## 2020-06-13 ENCOUNTER — Ambulatory Visit: Payer: Medicare Other | Admitting: Neurology

## 2020-06-13 VITALS — BP 132/79 | HR 85 | Ht <= 58 in | Wt 138.6 lb

## 2020-06-13 DIAGNOSIS — F0281 Dementia in other diseases classified elsewhere with behavioral disturbance: Secondary | ICD-10-CM

## 2020-06-13 DIAGNOSIS — G301 Alzheimer's disease with late onset: Secondary | ICD-10-CM | POA: Diagnosis not present

## 2020-06-13 DIAGNOSIS — F02818 Dementia in other diseases classified elsewhere, unspecified severity, with other behavioral disturbance: Secondary | ICD-10-CM

## 2020-06-13 MED ORDER — QUETIAPINE FUMARATE 25 MG PO TABS: ORAL_TABLET | ORAL | 3 refills | Status: DC

## 2020-06-13 MED ORDER — MIRTAZAPINE 15 MG PO TABS: 15.0000 mg | ORAL_TABLET | Freq: Every day | ORAL | 11 refills | Status: DC

## 2020-06-13 NOTE — Progress Notes (Signed)
NEUROLOGY FOLLOW UP OFFICE NOTE  Lori Blanchard [id number] 09-Dec-1945  HISTORY OF PRESENT ILLNESS: I had the pleasure of seeing Lori Blanchard in follow-up in the neurology clinic on 06/13/2020.  The patient was last seen 6 months ago for dementia. She is again accompanied by her daughter Lori Blanchard who helps supplement the history today.  Records and images were personally reviewed where available.  MMSE 19/30 in 11/2019. Lori Blanchard reports that she did not want to take the Rivastigmine because she was feeling more confused. Lori Blanchard has been getting her to wear her O2 via Lori Blanchard more consistently and feels it helps. Memory continues to decline. Her granddaughter manages medications, wanda manages finances. She does not drive. She is independent with dressing and bathing. No bowel/bladder incontinence. Main issues have been behavioral changes. Lori Blanchard reported poor appetite and sleep to her PCP and she was switched from Wellbutrin to Mirtazapine 7.5mg  qhs. Lori Blanchard reports that when she came off the Wellbutrin, behaviors significantly worsened, they are just leveling off now. She fell 2 months ago because she got so agitated and was fighting Lori Blanchard trying to help her down the stairs. Her anxiety is so bad, language is really, really bad. She cannot stand when the children play outside. She has not mentioned hallucinations in a while, she is on Seroquel 50mg  qhs. She cannot walk as far or as long as she used to. She refuses to use her walker or cane. She denies any headaches. She feels dizzy every now and then. She still does not sleep well. She feels tired but does not take naps. She colors all the time. She used to do word searches but after stopping a while, she forgets how to do them.    History on Initial Assessment 09/22/2018: This is a 74 year old ambidextrous right-hand dominant woman with a history of hypertension, hyperlipidemia, diabetes, hypothyroidism, presenting for second opinion regarding dementia. She has been  seeing [facility] since October 2016, last visit was in July 2019. On initial visit with Dr. Manuella Blanchard in October 2016, family reported memory decline starting around 2015. She was becoming more forgetful and family started checking behind her with bill payments. They deny getting lost driving. She had a SLUMS score of 18/30 and was started on Donepezil 5mg  daily. In March 2017, family reported auditory and visual hallucinations of things outside. She had an EEG in April 2017 which showed generalized fronto-central predominant delta slowing of the background. In November 2017, SLUMS score was 17/30, Donepezil increased to 10mg  daily which caused headaches, dizziness, and GI symptoms. Family was reporting more mood disturbances and was started on Lamotrigine for mood stabilization. This caused itching and rash. She was reporting headaches and was started on magnesium. She had an MRI brain in April 2018 which did not show any acute changes. There was note of stable cerebral volume since 2016 with no areas of disproportionate cerebral atrophy, mild to moderate chronic microvascular disease. In January 2019, family reported decline in memory and vivid dreams and hallucinations seeing her deceased son and parents. Donepezil was stopped and Seroquel 25mg  qhs started. There were no further hallucinations or vivid dreams since them.   Lori Blanchard today reports that she has not noticed significant decline since 2015. The patient states her memory is okay. She lives alone but her daughter stays with her during the day and granddaughter stays with her at night. Lori Blanchard reports she writes her own bills and family checks behind her, she had only missed one bill in  the past. If she gets confused, she asks for help. She stopped driving 6.4-4 years ago at Lori Blanchard recommendation. Her other daughter fixes her pillbox and she is pretty good with taking them by herself. She does not cook. Over the past 6 months, she is having more  agitation and anxiety, she gets mad at herself when she cannot remember. She would start cussing, which is unusual for her. She has been on Wellbutrin XL 150mg  daily for several years. Attempts to stop it in the past caused increased emotionality. Her daughter reports she is very negative and possibly paranoid. Sleep is good, no wandering behavior. She gets upset when family stays with her, she "just wants to be left some time by herself," getting upset when her daughter has to go to the grocery and bring her when she just wants to stay home and do her coloring.   She denies any further significant headaches, no dizziness, diplopia, dysarthria/dysphagia, neck/back pain, focal numbness/tingling/weakness, bowel/bladder dysfunction, anosmia, or tremor. She has left shoulder arthritis. Her daughter feels she has balance issues, she nearly tripped a month ago and her daughter caught her. Her father had dementia in his 19s. No history of significant head injuries or alcohol use.   Laboratory Data: 06/10/18: TSH 2.865, 10/01/16 B12 [id number]  PAST MEDICAL HISTORY: Past Medical History:  Diagnosis Date  . Alzheimer disease ([place])   . Anxiety   . Anxiety and depression   . Asthma   . Cancer ([place])    Basal Cell Skin Cancer/ LUNG/RADIATION LAST 8/20  . CHF (congestive heart failure) ([place])    patient denies this diagnosis  . Complication of anesthesia    2/20 oxygen and stayed overnight  . COPD (chronic obstructive pulmonary disease) ([place])   . Depression   . Diabetes ([place])   . Diverticulosis    patient denies problems for this  . Dyspnea   . Elevated lipids   . Fatty liver   . GERD (gastroesophageal reflux disease)   . Gout    no medication at this time  . History of kidney stones   . Hydronephrosis    stage III ckd  . Hyperlipemia   . Hypertension   . Hypothyroidism   . Lower extremity edema    comes and goes  . Osteoarthritis   . Osteoarthritis   . Osteoporosis   . Pulmonary nodule    has  been followed for years, not cancerous. causes breathing issues  . Retroperitoneal fibrosis   . Sleep apnea    CPAP  . Ureter injury   . Ureteropelvic junction (UPJ) obstruction    requires change of ureteral stent approximately every 2 to 3 months    MEDICATIONS: Current Outpatient Medications on File Prior to Visit  Medication Sig Dispense Refill  . albuterol (PROVENTIL HFA;VENTOLIN HFA) 108 (90 Base) MCG/ACT inhaler Inhale 2 puffs into the lungs every 4 (four) hours as needed for wheezing or shortness of breath.    Marland Kitchen alendronate (FOSAMAX) 70 MG tablet Take 70 mg by mouth every Friday.     Marland Kitchen atorvastatin (LIPITOR) 40 MG tablet Take 40 mg by mouth daily.     . Colchicine 0.6 MG CAPS Take 0.6 mg by mouth at bedtime.    . furosemide (LASIX) 20 MG tablet Take 20 mg by mouth daily.     Marland Kitchen gabapentin (NEURONTIN) 100 MG capsule Take 100 mg by mouth 2 (two) times daily.   0  . HYDROcodone-acetaminophen (NORCO/VICODIN) 5-325 MG tablet Take 0.5-1  tablets by mouth every 6 (six) hours as needed for moderate pain. 10 tablet 0  . insulin lispro (HUMALOG) 100 UNIT/ML KiwkPen Inject 12 Units into the skin 3 (three) times daily before meals.     Marland Kitchen LANTUS SOLOSTAR 100 UNIT/ML Solostar Pen Inject 35 Units into the skin at bedtime.     Marland Kitchen levothyroxine (SYNTHROID, LEVOTHROID) 75 MCG tablet Take 75 mcg by mouth daily before breakfast.    . metFORMIN (GLUCOPHAGE) 1000 MG tablet Take 1,000 mg by mouth 2 (two) times daily with a meal.    . mirtazapine (REMERON) 7.5 MG tablet Take 7.5 mg by mouth at bedtime.    Glory Rosebush ULTRA test strip     . pantoprazole (PROTONIX) 40 MG tablet Take 40 mg by mouth daily.     . QUEtiapine (SEROQUEL) 25 MG tablet Take 2 tablets every evening 180 tablet 3  . telmisartan (MICARDIS) 40 MG tablet Take 40 mg by mouth daily.     . trospium (SANCTURA) 20 MG tablet Take 1 tablet (20 mg total) by mouth daily. 30 tablet 2  . umeclidinium-vilanterol (ANORO ELLIPTA) 62.5-25 MCG/INH AEPB  Inhale 1 puff into the lungs daily.    . vitamin B-12 (CYANOCOBALAMIN) 1000 MCG tablet Take 1,000 mcg by mouth daily.     No current facility-administered medications on file prior to visit.    ALLERGIES: Allergies  Allergen Reactions  . Ibuprofen Itching, Nausea Only and Rash    FAMILY HISTORY: Family History  Problem Relation Age of Onset  . Liver cancer Mother   . Colon cancer Mother   . Breast cancer Mother 45  . Diabetes Daughter   . Kidney disease Daughter        adrenal tumors  . Breast cancer Maternal Aunt   . Kidney cancer Neg Hx   . Prostate cancer Neg Hx     SOCIAL HISTORY: Social History   Socioeconomic History  . Marital status: Married    Spouse name: Not on file  . Number of children: Not on file  . Years of education: Not on file  . Highest education level: Not on file  Occupational History  . Not on file  Tobacco Use  . Smoking status: Never Smoker  . Smokeless tobacco: Never Used  Vaping Use  . Vaping Use: Never used  Substance and Sexual Activity  . Alcohol use: No    Alcohol/week: 0.0 standard drinks  . Drug use: No  . Sexual activity: Yes    Birth control/protection: Surgical  Other Topics Concern  . Not on file  Social History Narrative   Pt is right handed   Lives alone in single story home (daughter and granddaughter rotate time there so pt is never alone in the home)   Has 4 children - 1 deceased/3 living   10th grade education   Retired from [employer] educational needs teacher   Social Determinants of Health   Financial Resource Strain:   . Difficulty of Paying Living Expenses: Not on file  Food Insecurity:   . Worried About Charity fundraiser in the Last Year: Not on file  . Ran Out of Food in the Last Year: Not on file  Transportation Needs:   . Lack of Transportation (Medical): Not on file  . Lack of Transportation (Non-Medical): Not on file  Physical Activity:   . Days of Exercise per Week: Not on file  . Minutes of Exercise per Session:  Not on file  Stress:   . Feeling  of Stress : Not on file  Social Connections:   . Frequency of Communication with Friends and Family: Not on file  . Frequency of Social Gatherings with Friends and Family: Not on file  . Attends Religious Services: Not on file  . Active Member of Clubs or Organizations: Not on file  . Attends Archivist Meetings: Not on file  . Marital Status: Not on file  Intimate Partner Violence:   . Fear of Current or Ex-Partner: Not on file  . Emotionally Abused: Not on file  . Physically Abused: Not on file  . Sexually Abused: Not on file     PHYSICAL EXAM: Vitals:   06/13/20 0819  BP: 132/79  Pulse: 85  SpO2: (!) 89%   General: No acute distress Head:  Normocephalic/atraumatic Skin/Extremities: No rash, no edema Neurological Exam: alert and oriented to person, place, and time. No aphasia or dysarthria. Fund of knowledge is appropriate.  Recent and remote memory are impaired. Attention and concentration are reduced. MMSE 21/30 MMSE - Mini Mental State Exam 06/13/2020 12/04/2019 09/22/2018  Orientation to time 4 0 4  Orientation to Place 5 3 3   Registration 3 3 3   Attention/ Calculation 0 4 5  Recall 2 2 2   Language- name 2 objects 2 2 2   Language- repeat 0 0 0  Language- follow 3 step command 2 2 3   Language- read & follow direction 1 1 1   Write a sentence 1 1 1   Copy design 1 1 0  Total score 21 19 24    Cranial nerves: Pupils equal, round. Extraocular movements intact with no nystagmus. Visual fields full.  No facial asymmetry.  Motor: Bulk and tone normal, muscle strength 5/5 throughout with no pronator drift.   Finger to nose testing intact.  Gait narrow-based and steady, able to tandem walk adequately.  Romberg negative.   IMPRESSION: This is a 74 yo RH woman with a history of hypertension, hyperlipidemia, diabetes, hypothyroidism, with moderate dementia, likely Alzheimer's disease. MMSE today 21/30 (19/30 in 11/2019, 24/30 in 09/2018).  She felt more confused on Rivastigmine. Lori Blanchard reports she has had more side effects than benefits with dementia medications, we discussed expectations from medications and agreed to hold off on starting any new medication at this time. Continue to address behavioral changes, increase mirtazapine to 15mg  qhs. If no improvement, can increase to 30mg  qhs in a month. Continue Seroquel 50mg  qhs, we again discussed cardiac black box warning with antipsychotics in dementia. Continue close supervision. Follow-up in 6-8 months, they know to call for any changes.   Thank you for allowing me to participate in her care.  Please do not hesitate to call for any questions or concerns.   Ellouise Newer, M.D.   CC: Dr. Ouida Sills

## 2020-06-13 NOTE — Patient Instructions (Signed)
1. Increase Mirtazapine to 15mg  every night. If no changes, call our office in a month and we will plan to increase to 30mg  every night  2. Continue Quetiapine (Seroquel) 25mg : take 2 tablets every night  3. May use Melatonin at night  4. Continue close supervision  5. Follow-up in 6-8 months, call for any changes   FALL PRECAUTIONS: Be cautious when walking. Scan the area for obstacles that may increase the risk of trips and falls. When getting up in the mornings, sit up at the edge of the bed for a few minutes before getting out of bed. Consider elevating the bed at the head end to avoid drop of blood pressure when getting up. Walk always in a well-lit room (use night lights in the walls). Avoid area rugs or power cords from appliances in the middle of the walkways. Use a walker or a cane if necessary and consider physical therapy for balance exercise. Get your eyesight checked regularly.  HOME SAFETY: Consider the safety of the kitchen when operating appliances like stoves, microwave oven, and blender. Consider having supervision and share cooking responsibilities until no longer able to participate in those. Accidents with firearms and other hazards in the house should be identified and addressed as well.  ABILITY TO BE LEFT ALONE: If patient is unable to contact 911 operator, consider using LifeLine, or when the need is there, arrange for someone to stay with patients. Smoking is a fire hazard, consider supervision or cessation. Risk of wandering should be assessed by caregiver and if detected at any point, supervision and safe proof recommendations should be instituted.  RECOMMENDATIONS FOR ALL PATIENTS WITH MEMORY PROBLEMS: 1. Continue to exercise (Recommend 30 minutes of walking everyday, or 3 hours every week) 2. Increase social interactions - continue going to [place] and enjoy social gatherings with friends and family 3. Eat healthy, avoid fried foods and eat more fruits and  vegetables 4. Maintain adequate blood pressure, blood sugar, and blood cholesterol level. Reducing the risk of stroke and cardiovascular disease also helps promoting better memory. 5. Avoid stressful situations. Live a simple life and avoid aggravations. Organize your time and prepare for the next day in anticipation. 6. Sleep well, avoid any interruptions of sleep and avoid any distractions in the bedroom that may interfere with adequate sleep quality 7. Avoid sugar, avoid sweets as there is a strong link between excessive sugar intake, diabetes, and cognitive impairment The Mediterranean diet has been shown to help patients reduce the risk of progressive memory disorders and reduces cardiovascular risk. This includes eating fish, eat fruits and green leafy vegetables, nuts like almonds and hazelnuts, walnuts, and also use olive oil. Avoid fast foods and fried foods as much as possible. Avoid sweets and sugar as sugar use has been linked to worsening of memory function.  There is always a concern of gradual progression of memory problems. If this is the case, then we may need to adjust level of care according to patient needs. Support, both to the patient and caregiver, should then be put into place.

## 2020-08-02 ENCOUNTER — Other Ambulatory Visit: Payer: Self-pay | Admitting: Internal Medicine

## 2020-08-04 ENCOUNTER — Other Ambulatory Visit: Payer: Self-pay | Admitting: Internal Medicine

## 2020-08-04 DIAGNOSIS — N644 Mastodynia: Secondary | ICD-10-CM

## 2020-08-30 ENCOUNTER — Other Ambulatory Visit: Payer: Self-pay | Admitting: Internal Medicine

## 2020-08-30 ENCOUNTER — Other Ambulatory Visit: Payer: Self-pay

## 2020-08-30 ENCOUNTER — Ambulatory Visit
Admission: RE | Admit: 2020-08-30 | Discharge: 2020-08-30 | Disposition: A | Discharge status: Home / Self Care | Payer: Medicare Other | Source: Ambulatory Visit | Attending: Internal Medicine | Admitting: Internal Medicine

## 2020-08-30 DIAGNOSIS — R1031 Right lower quadrant pain: Secondary | ICD-10-CM

## 2020-08-30 MED ORDER — IOHEXOL 300 MG/ML  SOLN
100.0000 mL | Freq: Once | INTRAMUSCULAR | Status: AC | PRN
  Administered 2020-08-30: 100 mL via INTRAVENOUS

## 2020-11-08 ENCOUNTER — Inpatient Hospital Stay: Payer: Medicare Other | Attending: Oncology | Admitting: Oncology

## 2020-11-08 ENCOUNTER — Telehealth: Payer: Self-pay | Admitting: *Deleted

## 2020-11-08 ENCOUNTER — Inpatient Hospital Stay: Payer: Medicare Other

## 2020-11-08 ENCOUNTER — Other Ambulatory Visit: Payer: Self-pay | Admitting: *Deleted

## 2020-11-08 ENCOUNTER — Encounter: Payer: Self-pay | Admitting: Oncology

## 2020-11-08 VITALS — BP 157/76 | HR 106 | Temp 97.6°F | Resp 20 | Wt 137.2 lb

## 2020-11-08 DIAGNOSIS — C439 Malignant melanoma of skin, unspecified: Secondary | ICD-10-CM

## 2020-11-08 DIAGNOSIS — G4733 Obstructive sleep apnea (adult) (pediatric): Secondary | ICD-10-CM | POA: Insufficient documentation

## 2020-11-08 DIAGNOSIS — Z85118 Personal history of other malignant neoplasm of bronchus and lung: Secondary | ICD-10-CM | POA: Insufficient documentation

## 2020-11-08 DIAGNOSIS — Z808 Family history of malignant neoplasm of other organs or systems: Secondary | ICD-10-CM | POA: Diagnosis not present

## 2020-11-08 DIAGNOSIS — G309 Alzheimer's disease, unspecified: Secondary | ICD-10-CM | POA: Insufficient documentation

## 2020-11-08 DIAGNOSIS — I10 Essential (primary) hypertension: Secondary | ICD-10-CM | POA: Insufficient documentation

## 2020-11-08 DIAGNOSIS — Z800 Family history of malignant neoplasm of digestive organs: Secondary | ICD-10-CM | POA: Insufficient documentation

## 2020-11-08 DIAGNOSIS — Z923 Personal history of irradiation: Secondary | ICD-10-CM | POA: Insufficient documentation

## 2020-11-08 DIAGNOSIS — Z803 Family history of malignant neoplasm of breast: Secondary | ICD-10-CM | POA: Insufficient documentation

## 2020-11-08 DIAGNOSIS — C4359 Malignant melanoma of other part of trunk: Secondary | ICD-10-CM | POA: Diagnosis not present

## 2020-11-08 LAB — COMPREHENSIVE METABOLIC PANEL
ALT: 24 U/L (ref 0–44)
AST: 38 U/L (ref 15–41)
Albumin: 4.4 g/dL (ref 3.5–5.0)
Alkaline Phosphatase: 105 U/L (ref 38–126)
Anion gap: 14 (ref 5–15)
BUN: 22 mg/dL (ref 8–23)
CO2: 25 mmol/L (ref 22–32)
Calcium: 9.6 mg/dL (ref 8.9–10.3)
Chloride: 98 mmol/L (ref 98–111)
Creatinine, Ser: 1.04 mg/dL — ABNORMAL HIGH (ref 0.44–1.00)
GFR, Estimated: 56 mL/min — ABNORMAL LOW (ref >60)
Glucose, Bld: 126 mg/dL — ABNORMAL HIGH (ref 70–99)
Potassium: 4.7 mmol/L (ref 3.5–5.1)
Sodium: 137 mmol/L (ref 135–145)
Total Bilirubin: 1 mg/dL (ref 0.3–1.2)
Total Protein: 7.8 g/dL (ref 6.5–8.1)

## 2020-11-08 LAB — CBC WITH DIFFERENTIAL/PLATELET
Abs Immature Granulocytes: 0.06 10*3/uL (ref 0.00–0.07)
Basophils Absolute: 0.1 10*3/uL (ref 0.0–0.1)
Basophils Relative: 0 %
Eosinophils Absolute: 0.3 10*3/uL (ref 0.0–0.5)
Eosinophils Relative: 2 %
HCT: 35.4 % — ABNORMAL LOW (ref 36.0–46.0)
Hemoglobin: 11.2 g/dL — ABNORMAL LOW (ref 12.0–15.0)
Immature Granulocytes: 1 %
Lymphocytes Relative: 19 %
Lymphs Abs: 2.3 10*3/uL (ref 0.7–4.0)
MCH: 25.7 pg — ABNORMAL LOW (ref 26.0–34.0)
MCHC: 31.6 g/dL (ref 30.0–36.0)
MCV: 81.4 fL (ref 80.0–100.0)
Monocytes Absolute: 1.2 10*3/uL — ABNORMAL HIGH (ref 0.1–1.0)
Monocytes Relative: 10 %
Neutro Abs: 8.4 10*3/uL — ABNORMAL HIGH (ref 1.7–7.7)
Neutrophils Relative %: 68 %
Platelets: 336 10*3/uL (ref 150–400)
RBC: 4.35 MIL/uL (ref 3.87–5.11)
RDW: 15.9 % — ABNORMAL HIGH (ref 11.5–15.5)
WBC: 12.2 10*3/uL — ABNORMAL HIGH (ref 4.0–10.5)
nRBC: 0 % (ref 0.0–0.2)

## 2020-11-08 LAB — LACTATE DEHYDROGENASE: LDH: 180 U/L (ref 98–192)

## 2020-11-08 NOTE — Progress Notes (Deleted)
Hematology/Oncology Consult note [facility]  Telephone:([phone] [phone]  Patient Care Team: [name], MD as PCP - General (Internal Medicine) Erby Pian, MD as Referring Physician (Specialist) Noreene Filbert, MD as Radiation Oncologist (Radiation Oncology) Cameron Sprang, MD as Consulting Physician (Neurology)   Name of the patient: Lori Blanchard  [id number]  [birth year]   Date of visit: 11/08/20  Diagnosis- ***  Chief complaint/ Reason for visit- ***  Heme/Onc history: ***  Interval history- ***  ECOG PS- *** Pain scale- *** Opioid associated constipation- ***  Review of systems- ROS   Current treatment- ***  Allergies  Allergen Reactions  . Ibuprofen Itching, Nausea Only and Rash     Past Medical History:  Diagnosis Date  . Alzheimer disease ([place])   . Anxiety   . Anxiety and depression   . Asthma   . Cancer ([place])    Basal Cell Skin Cancer/ LUNG/RADIATION LAST 8/20  . CHF (congestive heart failure) ([place])    patient denies this diagnosis  . Complication of anesthesia    2/20 oxygen and stayed overnight  . COPD (chronic obstructive pulmonary disease) ([place])   . Depression   . Diabetes ([place])   . Diverticulosis    patient denies problems for this  . Dyspnea   . Elevated lipids   . Fatty liver   . GERD (gastroesophageal reflux disease)   . Gout    no medication at this time  . History of kidney stones   . Hydronephrosis    stage III ckd  . Hyperlipemia   . Hypertension   . Hypothyroidism   . Lower extremity edema    comes and goes  . Osteoarthritis   . Osteoarthritis   . Osteoporosis   . Pulmonary nodule    has been followed for years, not cancerous. causes breathing issues  . Retroperitoneal fibrosis   . Sleep apnea    CPAP  . Ureter injury   . Ureteropelvic junction (UPJ) obstruction    requires change of ureteral stent approximately every 2 to 3 months     Past Surgical  History:  Procedure Laterality Date  . ABDOMINAL HYSTERECTOMY     partial  . ANKLE FRACTURE SURGERY Left    pins in ankle but not in correct location  . APPENDECTOMY  1965  . BREAST SURGERY Bilateral 1990   Reduction  . c setion    . CARPAL TUNNEL RELEASE Bilateral 2000  . CESAREAN SECTION  4010,2725, 1969   x 3  . CYSTOSCOPY W/ RETROGRADES Right 11/02/2015   Procedure: CYSTOSCOPY WITH RETROGRADE PYELOGRAM, URETERAL STENT EXCHANGE;  Surgeon: [name], MD;  Location: [facility];  Service: Urology;  Laterality: Right;  . CYSTOSCOPY W/ RETROGRADES Right 01/31/2016   Procedure: CYSTOSCOPY WITH RETROGRADE PYELOGRAM;  Surgeon: [name], MD;  Location: [facility];  Service: Urology;  Laterality: Right;  . CYSTOSCOPY W/ RETROGRADES Bilateral 05/09/2016   Procedure: CYSTOSCOPY WITH RETROGRADE PYELOGRAM;  Surgeon: [name], MD;  Location: [facility];  Service: Urology;  Laterality: Bilateral;  . CYSTOSCOPY W/ RETROGRADES Left 11/21/2016   Procedure: CYSTOSCOPY WITH RETROGRADE PYELOGRAM;  Surgeon: [name], MD;  Location: [facility];  Service: Urology;  Laterality: Left;  . CYSTOSCOPY W/ RETROGRADES Right 03/27/2017   Procedure: CYSTOSCOPY WITH RETROGRADE PYELOGRAM;  Surgeon: [name], MD;  Location: [facility];  Service: Urology;  Laterality: Right;  . CYSTOSCOPY W/ RETROGRADES Bilateral 05/31/2017   Procedure:  CYSTOSCOPY WITH RETROGRADE PYELOGRAM;  Surgeon: [name], MD;  Location: [facility];  Service: Urology;  Laterality: Bilateral;  . CYSTOSCOPY W/ RETROGRADES Right 07/22/2018   Procedure: CYSTOSCOPY WITH RETROGRADE PYELOGRAM;  Surgeon: Abbie Sons, MD;  Location: [facility];  Service: Urology;  Laterality: Right;  . CYSTOSCOPY W/ URETERAL STENT PLACEMENT Right 05/11/2015   Procedure: CYSTOSCOPY WITH STENT REPLACEMENT;  Surgeon: [name], MD;  Location: [facility];  Service: Urology;  Laterality: Right;  . CYSTOSCOPY W/ URETERAL STENT  PLACEMENT Right 01/31/2016   Procedure: CYSTOSCOPY, RETROGRADE PYELOGRAMS WITH STENT REPLACEMENT;  Surgeon: [name], MD;  Location: [facility];  Service: Urology;  Laterality: Right;  . CYSTOSCOPY W/ URETERAL STENT PLACEMENT Right 05/09/2016   Procedure: CYSTOSCOPY WITH STENT REPLACEMENT;  Surgeon: [name], MD;  Location: [facility];  Service: Urology;  Laterality: Right;  . CYSTOSCOPY W/ URETERAL STENT PLACEMENT Bilateral 07/04/2016   Procedure: CYSTOSCOPY WITH STENT REPLACEMENT;  Surgeon: [name], MD;  Location: [facility];  Service: Urology;  Laterality: Bilateral;  . CYSTOSCOPY W/ URETERAL STENT PLACEMENT Right 09/14/2016   Procedure: CYSTOSCOPY WITH STENT REPLACEMENT;  Surgeon: [name], MD;  Location: [facility];  Service: Urology;  Laterality: Right;  . CYSTOSCOPY W/ URETERAL STENT PLACEMENT Right 11/21/2016   Procedure: CYSTOSCOPY WITH STENT REPLACEMENT;  Surgeon: [name], MD;  Location: [facility];  Service: Urology;  Laterality: Right;  . CYSTOSCOPY W/ URETERAL STENT PLACEMENT Right 01/25/2017   Procedure: CYSTOSCOPY WITH STENT REPLACEMENT;  Surgeon: [name], MD;  Location: [facility];  Service: Urology;  Laterality: Right;  . CYSTOSCOPY W/ URETERAL STENT PLACEMENT Right 03/27/2017   Procedure: CYSTOSCOPY WITH STENT REPLACEMENT;  Surgeon: [name], MD;  Location: [facility];  Service: Urology;  Laterality: Right;  . CYSTOSCOPY W/ URETERAL STENT PLACEMENT Right 05/31/2017   Procedure: CYSTOSCOPY WITH STENT REPLACEMENT;  Surgeon: [name], MD;  Location: [facility];  Service: Urology;  Laterality: Right;  . CYSTOSCOPY W/ URETERAL STENT PLACEMENT Right 08/02/2017   Procedure: CYSTOSCOPY WITH STENT REPLACEMENT;  Surgeon: [name], MD;  Location: [facility];  Service: Urology;  Laterality: Right;  . CYSTOSCOPY W/ URETERAL STENT PLACEMENT Right 10/29/2017   Procedure: CYSTOSCOPY WITH STENT REPLACEMENT;  Surgeon: Abbie Sons, MD;  Location: [facility];  Service: Urology;  Laterality: Right;  . CYSTOSCOPY W/ URETERAL STENT PLACEMENT Right 02/14/2018   Procedure: CYSTOSCOPY WITH RETROGRADE PYELOGRAM/URETERAL STENT Exchange;  Surgeon: Abbie Sons, MD;  Location: [facility];  Service: Urology;  Laterality: Right;  . CYSTOSCOPY W/ URETERAL STENT PLACEMENT Right 03/18/2019   Procedure: CYSTOSCOPY WITH STENT REPLACEMENT;  Surgeon: Abbie Sons, MD;  Location: [facility];  Service: Urology;  Laterality: Right;  . CYSTOSCOPY W/ URETERAL STENT REMOVAL Left 09/14/2016   Procedure: CYSTOSCOPY WITH STENT REMOVAL;  Surgeon: [name], MD;  Location: [facility];  Service: Urology;  Laterality: Left;  . CYSTOSCOPY WITH STENT PLACEMENT Left 05/09/2016   Procedure: CYSTOSCOPY WITH STENT PLACEMENT;  Surgeon: [name], MD;  Location: [facility];  Service: Urology;  Laterality: Left;  . CYSTOSCOPY WITH STENT PLACEMENT Right 07/22/2018   Procedure: CYSTOSCOPY WITH STENT Exchange;  Surgeon: Abbie Sons, MD;  Location: [facility];  Service: Urology;  Laterality: Right;  . CYSTOSCOPY WITH STENT PLACEMENT Right 03/08/2020   Procedure: CYSTOSCOPY WITH STENT Exchange;  Surgeon: Abbie Sons, MD;  Location: [facility];  Service: Urology;  Laterality: Right;  . EYE  SURGERY Left 2018   tear duct reconstruction  . HERNIA REPAIR  1572   umbilical  . JOINT REPLACEMENT Left 2013   TKR  . JOINT REPLACEMENT Right 2011   TKR  . LAPAROSCOPIC HYSTERECTOMY    . REDUCTION MAMMAPLASTY    . REPLACEMENT TOTAL KNEE Bilateral   . SKIN CANCER EXCISION Left 2018   lower eye lid  . URETEROSCOPY Bilateral 05/09/2016   Procedure: URETEROSCOPY;  Surgeon: [name], MD;  Location: [facility];  Service: Urology;  Laterality: Bilateral;  . URETEROSCOPY WITH HOLMIUM LASER LITHOTRIPSY Left 07/04/2016   Procedure: URETEROSCOPY WITH HOLMIUM LASER LITHOTRIPSY;  Surgeon: [name], MD;  Location: [facility];  Service: Urology;   Laterality: Left;    Social History   Socioeconomic History  . Marital status: Married    Spouse name: Not on file  . Number of children: Not on file  . Years of education: Not on file  . Highest education level: Not on file  Occupational History  . Not on file  Tobacco Use  . Smoking status: Never Smoker  . Smokeless tobacco: Never Used  Vaping Use  . Vaping Use: Never used  Substance and Sexual Activity  . Alcohol use: No    Alcohol/week: 0.0 standard drinks  . Drug use: No  . Sexual activity: Yes    Birth control/protection: Surgical  Other Topics Concern  . Not on file  Social History Narrative   Pt is right handed   Lives alone in single story home (daughter and granddaughter rotate time there so pt is never alone in the home)   Has 4 children - 1 deceased/3 living   10th grade education   Retired from [employer] educational needs teacher   Social Determinants of Radio broadcast assistant Strain: Not on file  Food Insecurity: Not on file  Transportation Needs: Not on file  Physical Activity: Not on file  Stress: Not on file  Social Connections: Not on file  Intimate Partner Violence: Not on file    Family History  Problem Relation Age of Onset  . Liver cancer Mother   . Colon cancer Mother   . Breast cancer Mother 89  . Diabetes Daughter   . Kidney disease Daughter        adrenal tumors  . Breast cancer Maternal Aunt   . Kidney cancer Neg Hx   . Prostate cancer Neg Hx      Current Outpatient Medications:  .  alendronate (FOSAMAX) 70 MG tablet, Take 70 mg by mouth every Friday. , Disp: , Rfl:  .  atorvastatin (LIPITOR) 40 MG tablet, Take 40 mg by mouth daily. , Disp: , Rfl:  .  Colchicine 0.6 MG CAPS, Take 0.6 mg by mouth at bedtime., Disp: , Rfl:  .  furosemide (LASIX) 20 MG tablet, Take 20 mg by mouth daily. , Disp: , Rfl:  .  gabapentin (NEURONTIN) 100 MG capsule, Take 100 mg by mouth 2 (two) times daily. , Disp: , Rfl: 0 .  insulin lispro (HUMALOG) 100 UNIT/ML  KiwkPen, Inject 12 Units into the skin 3 (three) times daily before meals. , Disp: , Rfl:  .  levothyroxine (SYNTHROID, LEVOTHROID) 75 MCG tablet, Take 75 mcg by mouth daily before breakfast., Disp: , Rfl:  .  metFORMIN (GLUCOPHAGE) 1000 MG tablet, Take 1,000 mg by mouth 2 (two) times daily with a meal., Disp: , Rfl:  .  mirtazapine (REMERON) 15 MG tablet, Take 1 tablet (15 mg total) by  mouth at bedtime., Disp: 30 tablet, Rfl: 11 .  ONETOUCH ULTRA test strip, , Disp: , Rfl:  .  pantoprazole (PROTONIX) 40 MG tablet, Take 40 mg by mouth daily. , Disp: , Rfl:  .  QUEtiapine (SEROQUEL) 25 MG tablet, Take 2 tablets every evening, Disp: 180 tablet, Rfl: 3 .  telmisartan (MICARDIS) 40 MG tablet, Take 40 mg by mouth daily. , Disp: , Rfl:  .  trospium (SANCTURA) 20 MG tablet, Take 1 tablet (20 mg total) by mouth daily., Disp: 30 tablet, Rfl: 2 .  umeclidinium-vilanterol (ANORO ELLIPTA) 62.5-25 MCG/INH AEPB, Inhale 1 puff into the lungs daily., Disp: , Rfl:  .  vitamin B-12 (CYANOCOBALAMIN) 1000 MCG tablet, Take 1,000 mcg by mouth daily., Disp: , Rfl:  .  albuterol (PROVENTIL HFA;VENTOLIN HFA) 108 (90 Base) MCG/ACT inhaler, Inhale 2 puffs into the lungs every 4 (four) hours as needed for wheezing or shortness of breath. (Patient not taking: Reported on 11/08/2020), Disp: , Rfl:   Physical exam:  Vitals:   11/08/20 1359  BP: (!) 157/76  Pulse: (!) 106  Resp: 20  Temp: 97.6 F (36.4 C)  TempSrc: Tympanic  SpO2: 100%  Weight: 137 lb 3.2 oz (62.2 kg)   Physical Exam   CMP Latest Ref Rng & Units 05/20/2020  Glucose 70 - 99 mg/dL -  BUN 8 - 23 mg/dL -  Creatinine 0.44 - 1.00 mg/dL 1.30(H)  Sodium 135 - 145 mmol/L -  Potassium 3.5 - 5.1 mmol/L -  Chloride 98 - 111 mmol/L -  CO2 22 - 32 mmol/L -  Calcium 8.9 - 10.3 mg/dL -  Total Protein 6.5 - 8.1 g/dL -  Total Bilirubin 0.3 - 1.2 mg/dL -  Alkaline Phos 38 - 126 U/L -  AST 15 - 41 U/L -  ALT 14 - 54 U/L -   CBC Latest Ref Rng & Units 03/04/2020   WBC 4.0 - 10.5 K/uL 8.0  Hemoglobin 12.0 - 15.0 g/dL 10.5(L)  Hematocrit 36.0 - 46.0 % 33.9(L)  Platelets 150 - 400 K/uL 312      No results found.   Assessment and plan- Patient is a 75 y.o. female ***   Visit Diagnosis 1. Melanoma of skin ([place])      Dr. Randa Evens, MD, MPH [facility] [phone] 11/08/2020 3:16 PM

## 2020-11-08 NOTE — Telephone Encounter (Signed)
Called and left message to [name] to let her know that dr Janese Banks spoke to Dr. Dahlia Byes and made an appt for your mother 4/11 at 3:15. I got a message from their office that they said they spoke to pt. Not sure if it was you or your mom.

## 2020-11-13 ENCOUNTER — Encounter: Payer: Self-pay | Admitting: Oncology

## 2020-11-13 NOTE — Progress Notes (Addendum)
Hematology/Oncology Consult note [facility] Telephone:([phone] [phone]  Patient Care Team: [name], MD as PCP - General (Internal Medicine) Erby Pian, MD as Referring Physician (Specialist) Noreene Filbert, MD as Radiation Oncologist (Radiation Oncology) Cameron Sprang, MD as Consulting Physician (Neurology)   Name of the patient: Lori Blanchard  [id number]  January 13, 1946    Reason for referral-new diagnosis of malignant melanoma   Referring physician-Dr. Phillip Heal   Date of visit: 11/13/20   History of presenting illness- patient is a 75 year old female with a past medical history significant for multiple comorbidities including hypertension hyperlipidemia, depression, obstructive sleep apnea, Alzheimer's disease among other medical problems.  Patient was seen by Dr. Phillip Heal for suspicious skin lesions.She underwent 2 biopsies 1 from her left dorsal hand which came back with squamous cell carcinoma with superficial invasion.  Skin lesion from the left medial scapular back was positive for invasive malignant melanoma.  Breslow depth 2.4 mm.  Ulceration not identified.  Satellitosis not identified.  Vascular invasion not identified.  Regression not identified.  Deep margin focally involved.  PT3a.  Patient needs a definitive resection of this lesion at this time.  She is here with her daughter today and her daughter prefers to get surgery done locally at [facility] if possible.  Patient lives with her daughter and independent for IADLs.  She has a prior history of stage I lung cancer which was treated by radiation    ECOG PS- 2  Pain scale- 0   Review of systems- Review of Systems  Constitutional: Positive for malaise/fatigue. Negative for chills, fever and weight loss.  HENT: Negative for congestion, ear discharge and nosebleeds.   Eyes: Negative for blurred vision.  Respiratory: Negative for cough, hemoptysis, sputum  production, shortness of breath and wheezing.   Cardiovascular: Negative for chest pain, palpitations, orthopnea and claudication.  Gastrointestinal: Negative for abdominal pain, blood in stool, constipation, diarrhea, heartburn, melena, nausea and vomiting.  Genitourinary: Negative for dysuria, flank pain, frequency, hematuria and urgency.  Musculoskeletal: Negative for back pain, joint pain and myalgias.  Skin: Negative for rash.  Neurological: Negative for dizziness, tingling, focal weakness, seizures, weakness and headaches.  Endo/Heme/Allergies: Does not bruise/bleed easily.  Psychiatric/Behavioral: Negative for depression and suicidal ideas. The patient does not have insomnia.     Allergies  Allergen Reactions  . Ibuprofen Itching, Nausea Only and Rash    Patient Active Problem List   Diagnosis Date Noted  . Chronic respiratory failure with hypoxia ([place]) 10/14/2018  . Respiratory difficulty 10/10/2018  . Acute on chronic respiratory failure with hypoxia ([place]) 10/10/2018  . Anemia 04/15/2017  . Depression 04/15/2017  . HLD (hyperlipidemia) 04/15/2017  . Memory impairment 04/15/2017  . UPJ (ureteropelvic junction) obstruction 04/15/2017  . OSA on CPAP 06/04/2016  . Recurrent major depressive disorder, in partial remission ([place]) 10/20/2015  . Morbid obesity ([place]) 10/20/2015  . Major depression in remission ([place]) 09/01/2015  . 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 Late onset Alzheimer's disease without behavioral disturbance ([place]) 05/20/2015  . Abdominal hernia 04/22/2015  . Anxiety 04/22/2015  . Asthma without status asthmaticus 04/22/2015  . CAFL (chronic airflow limitation) ([place]) 04/22/2015  . Colon polyp 04/22/2015  . Depression, major, recurrent, in partial remission ([place]) 04/22/2015  . Fatty infiltration of liver 04/22/2015  . Gout 04/22/2015  . Hypersomnia with sleep apnea 04/22/2015  . Extreme obesity 04/22/2015  . Encounter for general  adult medical examination without abnormal findings 12/28/2014  .  Health care maintenance 12/28/2014  . Severe obesity (BMI 35.0-39.9) with comorbidity ([place]) 09/23/2014  . COPD, moderate ([place]) 05/13/2014  . Moderate COPD (chronic obstructive pulmonary disease) ([place]) 05/13/2014  . Chronic kidney disease (CKD), stage III (moderate) ([place]) 01/30/2014  . HTN (hypertension), benign 01/30/2014  . Type 2 diabetes mellitus with other specified complication ([place]) 78/24/2353  . Hypothyroid 01/30/2014  . Arthritis, degenerative 01/30/2014  . Obstructive apnea 01/30/2014  . Type 2 diabetes mellitus with stage 3 chronic kidney disease ([place]) 01/30/2014  . Hyperlipidemia associated with type 2 diabetes mellitus ([place]) 01/30/2014     Past Medical History:  Diagnosis Date  . Alzheimer disease ([place])   . Anxiety   . Anxiety and depression   . Asthma   . Cancer ([place])    Basal Cell Skin Cancer/ LUNG/RADIATION LAST 8/20  . CHF (congestive heart failure) ([place])    patient denies this diagnosis  . Complication of anesthesia    2/20 oxygen and stayed overnight  . COPD (chronic obstructive pulmonary disease) ([place])   . Depression   . Diabetes ([place])   . Diverticulosis    patient denies problems for this  . Dyspnea   . Elevated lipids   . Fatty liver   . GERD (gastroesophageal reflux disease)   . Gout    no medication at this time  . History of kidney stones   . Hydronephrosis    stage III ckd  . Hyperlipemia   . Hypertension   . Hypothyroidism   . Lower extremity edema    comes and goes  . Osteoarthritis   . Osteoarthritis   . Osteoporosis   . Pulmonary nodule    has been followed for years, not cancerous. causes breathing issues  . Retroperitoneal fibrosis   . Sleep apnea    CPAP  . Ureter injury   . Ureteropelvic junction (UPJ) obstruction    requires change of ureteral stent approximately every 2 to 3 months     Past Surgical History:  Procedure Laterality Date  . ABDOMINAL  HYSTERECTOMY     partial  . ANKLE FRACTURE SURGERY Left    pins in ankle but not in correct location  . APPENDECTOMY  1965  . BREAST SURGERY Bilateral 1990   Reduction  . c setion    . CARPAL TUNNEL RELEASE Bilateral 2000  . CESAREAN SECTION  6144,3154, 1969   x 3  . CYSTOSCOPY W/ RETROGRADES Right 11/02/2015   Procedure: CYSTOSCOPY WITH RETROGRADE PYELOGRAM, URETERAL STENT EXCHANGE;  Surgeon: [name], MD;  Location: [facility];  Service: Urology;  Laterality: Right;  . CYSTOSCOPY W/ RETROGRADES Right 01/31/2016   Procedure: CYSTOSCOPY WITH RETROGRADE PYELOGRAM;  Surgeon: [name], MD;  Location: [facility];  Service: Urology;  Laterality: Right;  . CYSTOSCOPY W/ RETROGRADES Bilateral 05/09/2016   Procedure: CYSTOSCOPY WITH RETROGRADE PYELOGRAM;  Surgeon: [name], MD;  Location: [facility];  Service: Urology;  Laterality: Bilateral;  . CYSTOSCOPY W/ RETROGRADES Left 11/21/2016   Procedure: CYSTOSCOPY WITH RETROGRADE PYELOGRAM;  Surgeon: [name], MD;  Location: [facility];  Service: Urology;  Laterality: Left;  . CYSTOSCOPY W/ RETROGRADES Right 03/27/2017   Procedure: CYSTOSCOPY WITH RETROGRADE PYELOGRAM;  Surgeon: [name], MD;  Location: [facility];  Service: Urology;  Laterality: Right;  . CYSTOSCOPY W/ RETROGRADES Bilateral 05/31/2017   Procedure: CYSTOSCOPY WITH RETROGRADE PYELOGRAM;  Surgeon: [name], MD;  Location: [facility];  Service: Urology;  Laterality: Bilateral;  . CYSTOSCOPY W/ RETROGRADES  Right 07/22/2018   Procedure: CYSTOSCOPY WITH RETROGRADE PYELOGRAM;  Surgeon: Abbie Sons, MD;  Location: [facility];  Service: Urology;  Laterality: Right;  . CYSTOSCOPY W/ URETERAL STENT PLACEMENT Right 05/11/2015   Procedure: CYSTOSCOPY WITH STENT REPLACEMENT;  Surgeon: [name], MD;  Location: [facility];  Service: Urology;  Laterality: Right;  . CYSTOSCOPY W/ URETERAL STENT PLACEMENT Right 01/31/2016   Procedure: CYSTOSCOPY,  RETROGRADE PYELOGRAMS WITH STENT REPLACEMENT;  Surgeon: [name], MD;  Location: [facility];  Service: Urology;  Laterality: Right;  . CYSTOSCOPY W/ URETERAL STENT PLACEMENT Right 05/09/2016   Procedure: CYSTOSCOPY WITH STENT REPLACEMENT;  Surgeon: [name], MD;  Location: [facility];  Service: Urology;  Laterality: Right;  . CYSTOSCOPY W/ URETERAL STENT PLACEMENT Bilateral 07/04/2016   Procedure: CYSTOSCOPY WITH STENT REPLACEMENT;  Surgeon: [name], MD;  Location: [facility];  Service: Urology;  Laterality: Bilateral;  . CYSTOSCOPY W/ URETERAL STENT PLACEMENT Right 09/14/2016   Procedure: CYSTOSCOPY WITH STENT REPLACEMENT;  Surgeon: [name], MD;  Location: [facility];  Service: Urology;  Laterality: Right;  . CYSTOSCOPY W/ URETERAL STENT PLACEMENT Right 11/21/2016   Procedure: CYSTOSCOPY WITH STENT REPLACEMENT;  Surgeon: [name], MD;  Location: [facility];  Service: Urology;  Laterality: Right;  . CYSTOSCOPY W/ URETERAL STENT PLACEMENT Right 01/25/2017   Procedure: CYSTOSCOPY WITH STENT REPLACEMENT;  Surgeon: [name], MD;  Location: [facility];  Service: Urology;  Laterality: Right;  . CYSTOSCOPY W/ URETERAL STENT PLACEMENT Right 03/27/2017   Procedure: CYSTOSCOPY WITH STENT REPLACEMENT;  Surgeon: [name], MD;  Location: [facility];  Service: Urology;  Laterality: Right;  . CYSTOSCOPY W/ URETERAL STENT PLACEMENT Right 05/31/2017   Procedure: CYSTOSCOPY WITH STENT REPLACEMENT;  Surgeon: [name], MD;  Location: [facility];  Service: Urology;  Laterality: Right;  . CYSTOSCOPY W/ URETERAL STENT PLACEMENT Right 08/02/2017   Procedure: CYSTOSCOPY WITH STENT REPLACEMENT;  Surgeon: [name], MD;  Location: [facility];  Service: Urology;  Laterality: Right;  . CYSTOSCOPY W/ URETERAL STENT PLACEMENT Right 10/29/2017   Procedure: CYSTOSCOPY WITH STENT REPLACEMENT;  Surgeon: Abbie Sons, MD;  Location: [facility];  Service: Urology;   Laterality: Right;  . CYSTOSCOPY W/ URETERAL STENT PLACEMENT Right 02/14/2018   Procedure: CYSTOSCOPY WITH RETROGRADE PYELOGRAM/URETERAL STENT Exchange;  Surgeon: Abbie Sons, MD;  Location: [facility];  Service: Urology;  Laterality: Right;  . CYSTOSCOPY W/ URETERAL STENT PLACEMENT Right 03/18/2019   Procedure: CYSTOSCOPY WITH STENT REPLACEMENT;  Surgeon: Abbie Sons, MD;  Location: [facility];  Service: Urology;  Laterality: Right;  . CYSTOSCOPY W/ URETERAL STENT REMOVAL Left 09/14/2016   Procedure: CYSTOSCOPY WITH STENT REMOVAL;  Surgeon: [name], MD;  Location: [facility];  Service: Urology;  Laterality: Left;  . CYSTOSCOPY WITH STENT PLACEMENT Left 05/09/2016   Procedure: CYSTOSCOPY WITH STENT PLACEMENT;  Surgeon: [name], MD;  Location: [facility];  Service: Urology;  Laterality: Left;  . CYSTOSCOPY WITH STENT PLACEMENT Right 07/22/2018   Procedure: CYSTOSCOPY WITH STENT Exchange;  Surgeon: Abbie Sons, MD;  Location: [facility];  Service: Urology;  Laterality: Right;  . CYSTOSCOPY WITH STENT PLACEMENT Right 03/08/2020   Procedure: CYSTOSCOPY WITH STENT Exchange;  Surgeon: Abbie Sons, MD;  Location: [facility];  Service: Urology;  Laterality: Right;  . EYE SURGERY Left 2018   tear duct reconstruction  . HERNIA REPAIR  3532   umbilical  . JOINT REPLACEMENT Left 2013  TKR  . JOINT REPLACEMENT Right 2011   TKR  . LAPAROSCOPIC HYSTERECTOMY    . REDUCTION MAMMAPLASTY    . REPLACEMENT TOTAL KNEE Bilateral   . SKIN CANCER EXCISION Left 2018   lower eye lid  . URETEROSCOPY Bilateral 05/09/2016   Procedure: URETEROSCOPY;  Surgeon: [name], MD;  Location: [facility];  Service: Urology;  Laterality: Bilateral;  . URETEROSCOPY WITH HOLMIUM LASER LITHOTRIPSY Left 07/04/2016   Procedure: URETEROSCOPY WITH HOLMIUM LASER LITHOTRIPSY;  Surgeon: [name], MD;  Location: [facility];  Service: Urology;  Laterality: Left;    Social History   Socioeconomic  History  . Marital status: Married    Spouse name: Not on file  . Number of children: Not on file  . Years of education: Not on file  . Highest education level: Not on file  Occupational History  . Not on file  Tobacco Use  . Smoking status: Never Smoker  . Smokeless tobacco: Never Used  Vaping Use  . Vaping Use: Never used  Substance and Sexual Activity  . Alcohol use: No    Alcohol/week: 0.0 standard drinks  . Drug use: No  . Sexual activity: Yes    Birth control/protection: Surgical  Other Topics Concern  . Not on file  Social History Narrative   Pt is right handed   Lives alone in single story home (daughter and granddaughter rotate time there so pt is never alone in the home)   Has 4 children - 1 deceased/3 living   10th grade education   Retired from Special educational needs teacher   Social Determinants of Radio broadcast assistant Strain: Not on file  Food Insecurity: Not on file  Transportation Needs: Not on file  Physical Activity: Not on file  Stress: Not on file  Social Connections: Not on file  Intimate Partner Violence: Not on file     Family History  Problem Relation Age of Onset  . Liver cancer Mother   . Colon cancer Mother   . Breast cancer Mother 55  . Diabetes Daughter   . Kidney disease Daughter        adrenal tumors  . Breast cancer Maternal Aunt   . Kidney cancer Neg Hx   . Prostate cancer Neg Hx      Current Outpatient Medications:  .  alendronate (FOSAMAX) 70 MG tablet, Take 70 mg by mouth every Friday. , Disp: , Rfl:  .  atorvastatin (LIPITOR) 40 MG tablet, Take 40 mg by mouth daily. , Disp: , Rfl:  .  Colchicine 0.6 MG CAPS, Take 0.6 mg by mouth at bedtime., Disp: , Rfl:  .  furosemide (LASIX) 20 MG tablet, Take 20 mg by mouth daily. , Disp: , Rfl:  .  gabapentin (NEURONTIN) 100 MG capsule, Take 100 mg by mouth 2 (two) times daily. , Disp: , Rfl: 0 .  insulin lispro (HUMALOG) 100 UNIT/ML KiwkPen, Inject 12 Units into the skin 3 (three) times daily  before meals. , Disp: , Rfl:  .  levothyroxine (SYNTHROID, LEVOTHROID) 75 MCG tablet, Take 75 mcg by mouth daily before breakfast., Disp: , Rfl:  .  metFORMIN (GLUCOPHAGE) 1000 MG tablet, Take 1,000 mg by mouth 2 (two) times daily with a meal., Disp: , Rfl:  .  mirtazapine (REMERON) 15 MG tablet, Take 1 tablet (15 mg total) by mouth at bedtime., Disp: 30 tablet, Rfl: 11 .  ONETOUCH ULTRA test strip, , Disp: , Rfl:  .  pantoprazole (PROTONIX) 40  MG tablet, Take 40 mg by mouth daily. , Disp: , Rfl:  .  QUEtiapine (SEROQUEL) 25 MG tablet, Take 2 tablets every evening, Disp: 180 tablet, Rfl: 3 .  telmisartan (MICARDIS) 40 MG tablet, Take 40 mg by mouth daily. , Disp: , Rfl:  .  trospium (SANCTURA) 20 MG tablet, Take 1 tablet (20 mg total) by mouth daily., Disp: 30 tablet, Rfl: 2 .  umeclidinium-vilanterol (ANORO ELLIPTA) 62.5-25 MCG/INH AEPB, Inhale 1 puff into the lungs daily., Disp: , Rfl:  .  vitamin B-12 (CYANOCOBALAMIN) 1000 MCG tablet, Take 1,000 mcg by mouth daily., Disp: , Rfl:  .  albuterol (PROVENTIL HFA;VENTOLIN HFA) 108 (90 Base) MCG/ACT inhaler, Inhale 2 puffs into the lungs every 4 (four) hours as needed for wheezing or shortness of breath. (Patient not taking: Reported on 11/08/2020), Disp: , Rfl:    Physical exam:  Vitals:   11/08/20 1359  BP: (!) 157/76  Pulse: (!) 106  Resp: 20  Temp: 97.6 F (36.4 C)  TempSrc: Tympanic  SpO2: 100%  Weight: 137 lb 3.2 oz (62.2 kg)   Physical Exam Constitutional:      Comments: Frail elderly woman on home oxygen  Cardiovascular:     Rate and Rhythm: Normal rate and regular rhythm.     Heart sounds: Normal heart sounds.  Pulmonary:     Effort: Pulmonary effort is normal.     Breath sounds: Normal breath sounds.  Abdominal:     General: Bowel sounds are normal.     Palpations: Abdomen is soft.  Lymphadenopathy:     Comments: No palpable cervical, supraclavicular, axillary or inguinal adenopathy   Skin:    General: Skin is warm and  dry.     Comments: Superficial scab noted over the left dorsal aspect of the hand.  There are 2 lesions noted over the back and third area which has been surgically excised in the medial aspect of left back  Neurological:     Mental Status: She is alert and oriented to person, place, and time.        CMP Latest Ref Rng & Units 11/08/2020  Glucose 70 - 99 mg/dL 126(H)  BUN 8 - 23 mg/dL 22  Creatinine 0.44 - 1.00 mg/dL 1.04(H)  Sodium 135 - 145 mmol/L 137  Potassium 3.5 - 5.1 mmol/L 4.7  Chloride 98 - 111 mmol/L 98  CO2 22 - 32 mmol/L 25  Calcium 8.9 - 10.3 mg/dL 9.6  Total Protein 6.5 - 8.1 g/dL 7.8  Total Bilirubin 0.3 - 1.2 mg/dL 1.0  Alkaline Phos 38 - 126 U/L 105  AST 15 - 41 U/L 38  ALT 0 - 44 U/L 24   CBC Latest Ref Rng & Units 11/08/2020  WBC 4.0 - 10.5 K/uL 12.2(H)  Hemoglobin 12.0 - 15.0 g/dL 11.2(L)  Hematocrit 36.0 - 46.0 % 35.4(L)  Platelets 150 - 400 K/uL 336      No results found.  Assessment and plan- Patient is a 75 y.o. female referred for new diagnosis of malignant melanoma P T3a  Patient has 3 lesions over her back 2 of which appear like superficial inflammation which appears to be resolving with scabbing.  The third lesion was surgically excised near the upper aspect of the left back and was positive for malignant melanoma 2.4 mm with focally involved deep margin.  Pathologic staging was at least a P T3a.  She would need definitive wide excision for this and ideally a sentinel lymph node biopsy also needs  to be considered.  She does not have any palpable adenopathy.  Patient had a recent CT abdomen and pelvis with contrast in January 2022 which showed no acute pathology.  As such staging scans are not absolutely necessary in stage IIa disease.  However given her prior history of lung cancer a prior scan from October showing confluent opacity 2.7 x 2.2 cm in the right lower lobe in the area of prior radiation I will plan to obtain a repeat CT chest with contrast  at this time.  I will refer the patient to Dr.Pabon for definitive surgical management of melanoma.  I will see her after CT scan results are back.  I will check a CBC with differential CMP and LDH today   Thank you for this kind referral and the opportunity to participate in the care of this  Patient   Visit Diagnosis 1. Melanoma of skin ([place])     Dr. Randa Evens, MD, MPH [facility] [phone] 11/13/2020

## 2020-11-13 NOTE — Addendum Note (Signed)
Addended by: Randa Evens C on: 11/13/2020 07:18 PM   Modules accepted: Level of Service

## 2020-11-14 ENCOUNTER — Other Ambulatory Visit: Payer: Self-pay

## 2020-11-14 ENCOUNTER — Ambulatory Visit: Payer: Self-pay | Admitting: Surgery

## 2020-11-14 ENCOUNTER — Ambulatory Visit: Payer: Medicare Other | Admitting: Neurology

## 2020-11-14 ENCOUNTER — Encounter: Payer: Self-pay | Admitting: Neurology

## 2020-11-14 VITALS — BP 124/74 | HR 104 | Ht <= 58 in | Wt 139.0 lb

## 2020-11-14 DIAGNOSIS — F0281 Dementia in other diseases classified elsewhere with behavioral disturbance: Secondary | ICD-10-CM

## 2020-11-14 DIAGNOSIS — F02818 Dementia in other diseases classified elsewhere, unspecified severity, with other behavioral disturbance: Secondary | ICD-10-CM

## 2020-11-14 DIAGNOSIS — G301 Alzheimer's disease with late onset: Secondary | ICD-10-CM | POA: Diagnosis not present

## 2020-11-14 MED ORDER — QUETIAPINE FUMARATE 50 MG PO TABS: 50.0000 mg | ORAL_TABLET | Freq: Every day | ORAL | 11 refills | Status: AC

## 2020-11-14 MED ORDER — MIRTAZAPINE 30 MG PO TABS: 30.0000 mg | ORAL_TABLET | Freq: Every day | ORAL | 11 refills | Status: AC

## 2020-11-14 NOTE — Patient Instructions (Addendum)
1. Proceed with Urology visit and discuss urination issues  2. Increase mirtazapine to 30mg  every night  3. Continue Seroquel 50mg  every night  4. Look into adult day programs, call [phone]  5. Follow-up in 6 months, call for any changes   FALL PRECAUTIONS: Be cautious when walking. Scan the area for obstacles that may increase the risk of trips and falls. When getting up in the mornings, sit up at the edge of the bed for a few minutes before getting out of bed. Consider elevating the bed at the head end to avoid drop of blood pressure when getting up. Walk always in a well-lit room (use night lights in the walls). Avoid area rugs or power cords from appliances in the middle of the walkways. Use a walker or a cane if necessary and consider physical therapy for balance exercise. Get your eyesight checked regularly.   HOME SAFETY: Consider the safety of the kitchen when operating appliances like stoves, microwave oven, and blender. Consider having supervision and share cooking responsibilities until no longer able to participate in those. Accidents with firearms and other hazards in the house should be identified and addressed as well.   ABILITY TO BE LEFT ALONE: If patient is unable to contact 911 operator, consider using LifeLine, or when the need is there, arrange for someone to stay with patients. Smoking is a fire hazard, consider supervision or cessation. Risk of wandering should be assessed by caregiver and if detected at any point, supervision and safe proof recommendations should be instituted.   RECOMMENDATIONS FOR ALL PATIENTS WITH MEMORY PROBLEMS: 1. Continue to exercise (Recommend 30 minutes of walking everyday, or 3 hours every week) 2. Increase social interactions - continue going to [place] and enjoy social gatherings with friends and family 3. Eat healthy, avoid fried foods and eat more fruits and vegetables 4. Maintain adequate blood pressure, blood sugar, and blood  cholesterol level. Reducing the risk of stroke and cardiovascular disease also helps promoting better memory. 5. Avoid stressful situations. Live a simple life and avoid aggravations. Organize your time and prepare for the next day in anticipation. 6. Sleep well, avoid any interruptions of sleep and avoid any distractions in the bedroom that may interfere with adequate sleep quality 7. Avoid sugar, avoid sweets as there is a strong link between excessive sugar intake, diabetes, and cognitive impairment The Mediterranean diet has been shown to help patients reduce the risk of progressive memory disorders and reduces cardiovascular risk. This includes eating fish, eat fruits and green leafy vegetables, nuts like almonds and hazelnuts, walnuts, and also use olive oil. Avoid fast foods and fried foods as much as possible. Avoid sweets and sugar as sugar use has been linked to worsening of memory function.  There is always a concern of gradual progression of memory problems. If this is the case, then we may need to adjust level of care according to patient needs. Support, both to the patient and caregiver, should then be put into place.

## 2020-11-14 NOTE — Progress Notes (Signed)
NEUROLOGY FOLLOW UP OFFICE NOTE  Lori Blanchard [id number] 27-Oct-1945  HISTORY OF PRESENT ILLNESS: I had the pleasure of seeing Lori Blanchard in follow-up in the neurology clinic on 11/14/2020.  The patient was last seen 5 months ago for dementia. She is again accompanied by her daughter Lori Blanchard who helps supplement the history today.  Records and images were personally reviewed where available.  Lori Blanchard reports continued progression, there is a lot of more staring off into space, not remembering names. She cannot hand you anything asked for, "it's not clicking." Lori Blanchard reports there has been a big change in the past 2 weeks. She has started to have an increase in bladder more than bowel incontinence, and does not want to wear Depends. Her urine "stinks really, really bad." She will be seeing the Urologist tomorrow. She is up and down at night, there is a lot of anxiety. She gets very upset with scammers calling all the time (20 calls a day). She is on Mirtazapine 15mg  qhs and Seroquel 50mg  qhs without side effects. Lori Blanchard feels she is also depressed and has given up on her will to live. She denies any headaches, dizziness, no falls. She ambulates with a walker.    History on Initial Assessment 09/22/2018: This is a 75 year old ambidextrous right-hand dominant woman with a history of hypertension, hyperlipidemia, diabetes, hypothyroidism, presenting for second opinion regarding dementia. She has been seeing [facility] since October 2016, last visit was in July 2019. On initial visit with Dr. Manuella Ghazi in October 2016, family reported memory decline starting around 2015. She was becoming more forgetful and family started checking behind her with bill payments. They deny getting lost driving. She had a SLUMS score of 18/30 and was started on Donepezil 5mg  daily. In March 2017, family reported auditory and visual hallucinations of things outside. She had an EEG in April 2017 which showed generalized fronto-central  predominant delta slowing of the background. In November 2017, SLUMS score was 17/30, Donepezil increased to 10mg  daily which caused headaches, dizziness, and GI symptoms. Family was reporting more mood disturbances and was started on Lamotrigine for mood stabilization. This caused itching and rash. She was reporting headaches and was started on magnesium. She had an MRI brain in April 2018 which did not show any acute changes. There was note of stable cerebral volume since 2016 with no areas of disproportionate cerebral atrophy, mild to moderate chronic microvascular disease. In January 2019, family reported decline in memory and vivid dreams and hallucinations seeing her deceased son and parents. Donepezil was stopped and Seroquel 25mg  qhs started. There were no further hallucinations or vivid dreams since them.   Lori Blanchard today reports that she has not noticed significant decline since 2015. The patient states her memory is okay. She lives alone but her daughter stays with her during the day and granddaughter stays with her at night. Lori Blanchard reports she writes her own bills and family checks behind her, she had only missed one bill in the past. If she gets confused, she asks for help. She stopped driving 4.0-0 years ago at Dr. Trena Platt recommendation. Her other daughter fixes her pillbox and she is pretty good with taking them by herself. She does not cook. Over the past 6 months, she is having more agitation and anxiety, she gets mad at herself when she cannot remember. She would start cussing, which is unusual for her. She has been on Wellbutrin XL 150mg  daily for several years. Attempts to stop it in  the past caused increased emotionality. Her daughter reports she is very negative and possibly paranoid. Sleep is good, no wandering behavior. She gets upset when family stays with her, she "just wants to be left some time by herself," getting upset when her daughter has to go to the grocery and bring her when she just  wants to stay home and do her coloring.   She denies any further significant headaches, no dizziness, diplopia, dysarthria/dysphagia, neck/back pain, focal numbness/tingling/weakness, bowel/bladder dysfunction, anosmia, or tremor. She has left shoulder arthritis. Her daughter feels she has balance issues, she nearly tripped a month ago and her daughter caught her. Her father had dementia in his 41s. No history of significant head injuries or alcohol use.   Laboratory Data: 06/10/18: TSH 2.865, 10/01/16 B12 399  PAST MEDICAL HISTORY: Past Medical History:  Diagnosis Date  . Alzheimer disease ([place])   . Anxiety   . Anxiety and depression   . Asthma   . Cancer ([place])    Basal Cell Skin Cancer/ LUNG/RADIATION LAST 8/20  . CHF (congestive heart failure) ([place])    patient denies this diagnosis  . Complication of anesthesia    2/20 oxygen and stayed overnight  . COPD (chronic obstructive pulmonary disease) ([place])   . Depression   . Diabetes ([place])   . Diverticulosis    patient denies problems for this  . Dyspnea   . Elevated lipids   . Fatty liver   . GERD (gastroesophageal reflux disease)   . Gout    no medication at this time  . History of kidney stones   . Hydronephrosis    stage III ckd  . Hyperlipemia   . Hypertension   . Hypothyroidism   . Lower extremity edema    comes and goes  . Osteoarthritis   . Osteoarthritis   . Osteoporosis   . Pulmonary nodule    has been followed for years, not cancerous. causes breathing issues  . Retroperitoneal fibrosis   . Sleep apnea    CPAP  . Ureter injury   . Ureteropelvic junction (UPJ) obstruction    requires change of ureteral stent approximately every 2 to 3 months    MEDICATIONS: Current Outpatient Medications on File Prior to Visit  Medication Sig Dispense Refill  . alendronate (FOSAMAX) 70 MG tablet Take 70 mg by mouth every Friday.     Marland Kitchen atorvastatin (LIPITOR) 40 MG tablet Take 40 mg by mouth daily.     . Colchicine 0.6 MG  CAPS Take 0.6 mg by mouth at bedtime.    . furosemide (LASIX) 20 MG tablet Take 20 mg by mouth daily.     Marland Kitchen gabapentin (NEURONTIN) 100 MG capsule Take 100 mg by mouth 2 (two) times daily.   0  . insulin lispro (HUMALOG) 100 UNIT/ML KiwkPen Inject 12 Units into the skin 3 (three) times daily before meals.     Marland Kitchen levothyroxine (SYNTHROID, LEVOTHROID) 75 MCG tablet Take 75 mcg by mouth daily before breakfast.    . metFORMIN (GLUCOPHAGE) 1000 MG tablet Take 1,000 mg by mouth 2 (two) times daily with a meal.    . mirtazapine (REMERON) 15 MG tablet Take 1 tablet (15 mg total) by mouth at bedtime. 30 tablet 11  . ONETOUCH ULTRA test strip     . pantoprazole (PROTONIX) 40 MG tablet Take 40 mg by mouth daily.     . QUEtiapine (SEROQUEL) 25 MG tablet Take 2 tablets every evening 180 tablet 3  . telmisartan (MICARDIS)  40 MG tablet Take 40 mg by mouth daily.     . trospium (SANCTURA) 20 MG tablet Take 1 tablet (20 mg total) by mouth daily. 30 tablet 2  . umeclidinium-vilanterol (ANORO ELLIPTA) 62.5-25 MCG/INH AEPB Inhale 1 puff into the lungs daily.    . vitamin B-12 (CYANOCOBALAMIN) 1000 MCG tablet Take 1,000 mcg by mouth daily.    Marland Kitchen albuterol (PROVENTIL HFA;VENTOLIN HFA) 108 (90 Base) MCG/ACT inhaler Inhale 2 puffs into the lungs every 4 (four) hours as needed for wheezing or shortness of breath. (Patient not taking: Reported on 11/08/2020)     No current facility-administered medications on file prior to visit.    ALLERGIES: Allergies  Allergen Reactions  . Ibuprofen Itching, Nausea Only and Rash    FAMILY HISTORY: Family History  Problem Relation Age of Onset  . Liver cancer Mother   . Colon cancer Mother   . Breast cancer Mother 28  . Diabetes Daughter   . Kidney disease Daughter        adrenal tumors  . Breast cancer Maternal Aunt   . Kidney cancer Neg Hx   . Prostate cancer Neg Hx     SOCIAL HISTORY: Social History   Socioeconomic History  . Marital status: Married    Spouse name:  Not on file  . Number of children: Not on file  . Years of education: Not on file  . Highest education level: Not on file  Occupational History  . Not on file  Tobacco Use  . Smoking status: Never Smoker  . Smokeless tobacco: Never Used  Vaping Use  . Vaping Use: Never used  Substance and Sexual Activity  . Alcohol use: No    Alcohol/week: 0.0 standard drinks  . Drug use: No  . Sexual activity: Yes    Birth control/protection: Surgical  Other Topics Concern  . Not on file  Social History Narrative   Pt is right handed   Lives alone in single story home (daughter and granddaughter rotate time there so pt is never alone in the home)   Has 4 children - 1 deceased/3 living   10th grade education   Retired from [employer] Strain: Not on file  Food Insecurity: Not on file  Transportation Needs: Not on file  Physical Activity: Not on file  Stress: Not on file  Social Connections: Not on file  Intimate Partner Violence: Not on file     PHYSICAL EXAM: Vitals:   11/14/20 1447  BP: 124/74  Pulse: (!) 104  SpO2: 93%   General: No acute distress Head:  Normocephalic/atraumatic Skin/Extremities: No rash, no edema Neurological Exam: alert and oriented to person, month, does not know year, states she is in [place], [place]. No aphasia or dysarthria. Fund of knowledge is reduced.  Recent and remote memory are impaired, 0/3 delayed recall. Attention and concentration are reduced, unable to spell WORLD. Cranial nerves: Pupils equal, round. Extraocular movements intact with no nystagmus. Visual fields full.  No facial asymmetry.  Motor: Bulk and tone normal, muscle strength 5/5 throughout with no pronator drift.   Finger to nose testing intact.  Gait slow and cautious with small steps on her walker   IMPRESSION: This is a 75 yo RH woman with a history of hypertension, hyperlipidemia, diabetes, hypothyroidism, with moderate dementia,  likely Alzheimer's disease. Her daughter reports continued decline, worse in the last 2 weeks, which may be due to possible UTI, she has  an appointment with Urology tomorrow. She had side effects on Donepezil and Rivastigmine, and after discussing risks and benefits, again agreed to hold off on dementia medications. Continue 24/7 care. She is having more mood changes, increase mirtazapine to 30mg  qhs, continue Seroquel 50mg  qhs. Encouraged to look into adult day programs locally. Follow-up in 6 months, call for any changes.   Thank you for allowing me to participate in her care.  Please do not hesitate to call for any questions or concerns.   Ellouise Newer, M.D.   CC: Dr. Ouida Sills

## 2020-11-15 ENCOUNTER — Encounter: Payer: Self-pay | Admitting: Physician Assistant

## 2020-11-15 ENCOUNTER — Ambulatory Visit (INDEPENDENT_AMBULATORY_CARE_PROVIDER_SITE_OTHER): Payer: Medicare Other | Admitting: Physician Assistant

## 2020-11-15 ENCOUNTER — Ambulatory Visit
Admission: RE | Admit: 2020-11-15 | Discharge: 2020-11-15 | Disposition: A | Discharge status: Home / Self Care | Payer: Medicare Other | Source: Ambulatory Visit | Attending: Physician Assistant | Admitting: Physician Assistant

## 2020-11-15 ENCOUNTER — Ambulatory Visit
Admission: RE | Admit: 2020-11-15 | Discharge: 2020-11-15 | Disposition: A | Discharge status: Home / Self Care | Payer: Medicare Other | Attending: Physician Assistant | Admitting: Physician Assistant

## 2020-11-15 VITALS — BP 121/69 | HR 111 | Ht <= 58 in | Wt 139.0 lb

## 2020-11-15 DIAGNOSIS — R1084 Generalized abdominal pain: Secondary | ICD-10-CM

## 2020-11-15 LAB — URINALYSIS, COMPLETE
Bilirubin, UA: NEGATIVE
Glucose, UA: NEGATIVE
Ketones, UA: NEGATIVE
Leukocytes,UA: NEGATIVE
Nitrite, UA: NEGATIVE
RBC, UA: NEGATIVE
Specific Gravity, UA: <1.005 — ABNORMAL LOW (ref 1.005–1.030)
Urobilinogen, Ur: 0.2 mg/dL (ref 0.2–1.0)
pH, UA: 5 (ref 5.0–7.5)

## 2020-11-15 LAB — MICROSCOPIC EXAMINATION
Bacteria, UA: NONE SEEN
RBC, Urine: NONE SEEN /hpf (ref 0–2)

## 2020-11-15 NOTE — Progress Notes (Signed)
In and Out Catheterization  Patient is present today for a I & O catheterization due to UA. Patient was cleaned and prepped in a sterile fashion with betadine . A 14FR cath was inserted no complications were noted , 68ml of urine return was noted, urine was yellow in color. A clean urine sample was collected for urinalysis. Bladder was drained  And catheter was removed with out difficulty.    Performed by: Bradly Bienenstock, CMA

## 2020-11-15 NOTE — Progress Notes (Signed)
11/15/2020 11:26 AM   Lori Blanchard 1946/07/06 [id number]  CC: Chief Complaint  Patient presents with  . Other    HPI: Lori Blanchard is a 75 y.o. 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  She is accompanied today by her daughter, who contributes to HPI.  She was seen by neurology yesterday and her daughter reported acute worsening of her cognitive deficits over the last 2 weeks associated with increased bladder incontinence and malodorous urine.  She was seen by oncology 1 week ago for evaluation of newly diagnosed malignant melanoma.  Labs drawn at that visit notable for mild leukocytosis of 12.2.  Today she reports generalized abdominal pain and soreness.  She is on supplemental oxygen has had increased cough over the past several weeks.  Her daughter believes her abdominal pain may be soreness secondary to coughing.  Patient underwent CTAP with contrast on 08/30/2020 which revealed a large stool burden throughout the colon.  Patient denies constipation today but is unable to clarify frequency or quality of her stools.  She has been taking fiber supplements to help with BMs.  They believe she may be having diarrhea  In-office catheterized UA today positive for trace protein; urine microscopy with amorphous crystals.  PMH: Past Medical History:  Diagnosis Date  . Alzheimer disease ([place])   . Anxiety   . Anxiety and depression   . Asthma   . Cancer ([place])    Basal Cell Skin Cancer/ LUNG/RADIATION LAST 8/20  . CHF (congestive heart failure) ([place])    patient denies this diagnosis  . Complication of anesthesia    2/20 oxygen and stayed overnight  . COPD (chronic obstructive pulmonary disease) ([place])   . Depression   . Diabetes ([place])   . Diverticulosis    patient denies problems for this  . Dyspnea   . Elevated lipids   . Fatty liver   . GERD (gastroesophageal reflux  disease)   . Gout    no medication at this time  . History of kidney stones   . Hydronephrosis    stage III ckd  . Hyperlipemia   . Hypertension   . Hypothyroidism   . Lower extremity edema    comes and goes  . Osteoarthritis   . Osteoarthritis   . Osteoporosis   . Pulmonary nodule    has been followed for years, not cancerous. causes breathing issues  . Retroperitoneal fibrosis   . Sleep apnea    CPAP  . Ureter injury   . Ureteropelvic junction (UPJ) obstruction    requires change of ureteral stent approximately every 2 to 3 months    Surgical History: Past Surgical History:  Procedure Laterality Date  . ABDOMINAL HYSTERECTOMY     partial  . ANKLE FRACTURE SURGERY Left    pins in ankle but not in correct location  . APPENDECTOMY  1965  . BREAST SURGERY Bilateral 1990   Reduction  . c setion    . CARPAL TUNNEL RELEASE Bilateral 2000  . CESAREAN SECTION  0962,8366, 1969   x 3  . CYSTOSCOPY W/ RETROGRADES Right 11/02/2015   Procedure: CYSTOSCOPY WITH RETROGRADE PYELOGRAM, URETERAL STENT EXCHANGE;  Surgeon: [name], MD;  Location: [facility];  Service: Urology;  Laterality: Right;  . CYSTOSCOPY W/ RETROGRADES Right 01/31/2016   Procedure: CYSTOSCOPY WITH RETROGRADE PYELOGRAM;  Surgeon: [name], MD;  Location: [facility];  Service:  Urology;  Laterality: Right;  . CYSTOSCOPY W/ RETROGRADES Bilateral 05/09/2016   Procedure: CYSTOSCOPY WITH RETROGRADE PYELOGRAM;  Surgeon: [name], MD;  Location: [facility];  Service: Urology;  Laterality: Bilateral;  . CYSTOSCOPY W/ RETROGRADES Left 11/21/2016   Procedure: CYSTOSCOPY WITH RETROGRADE PYELOGRAM;  Surgeon: [name], MD;  Location: [facility];  Service: Urology;  Laterality: Left;  . CYSTOSCOPY W/ RETROGRADES Right 03/27/2017   Procedure: CYSTOSCOPY WITH RETROGRADE PYELOGRAM;  Surgeon: [name], MD;  Location: [facility];  Service: Urology;  Laterality: Right;  . CYSTOSCOPY W/ RETROGRADES  Bilateral 05/31/2017   Procedure: CYSTOSCOPY WITH RETROGRADE PYELOGRAM;  Surgeon: [name], MD;  Location: [facility];  Service: Urology;  Laterality: Bilateral;  . CYSTOSCOPY W/ RETROGRADES Right 07/22/2018   Procedure: CYSTOSCOPY WITH RETROGRADE PYELOGRAM;  Surgeon: Abbie Sons, MD;  Location: [facility];  Service: Urology;  Laterality: Right;  . CYSTOSCOPY W/ URETERAL STENT PLACEMENT Right 05/11/2015   Procedure: CYSTOSCOPY WITH STENT REPLACEMENT;  Surgeon: [name], MD;  Location: [facility];  Service: Urology;  Laterality: Right;  . CYSTOSCOPY W/ URETERAL STENT PLACEMENT Right 01/31/2016   Procedure: CYSTOSCOPY, RETROGRADE PYELOGRAMS WITH STENT REPLACEMENT;  Surgeon: [name], MD;  Location: [facility];  Service: Urology;  Laterality: Right;  . CYSTOSCOPY W/ URETERAL STENT PLACEMENT Right 05/09/2016   Procedure: CYSTOSCOPY WITH STENT REPLACEMENT;  Surgeon: [name], MD;  Location: [facility];  Service: Urology;  Laterality: Right;  . CYSTOSCOPY W/ URETERAL STENT PLACEMENT Bilateral 07/04/2016   Procedure: CYSTOSCOPY WITH STENT REPLACEMENT;  Surgeon: [name], MD;  Location: [facility];  Service: Urology;  Laterality: Bilateral;  . CYSTOSCOPY W/ URETERAL STENT PLACEMENT Right 09/14/2016   Procedure: CYSTOSCOPY WITH STENT REPLACEMENT;  Surgeon: [name], MD;  Location: [facility];  Service: Urology;  Laterality: Right;  . CYSTOSCOPY W/ URETERAL STENT PLACEMENT Right 11/21/2016   Procedure: CYSTOSCOPY WITH STENT REPLACEMENT;  Surgeon: [name], MD;  Location: [facility];  Service: Urology;  Laterality: Right;  . CYSTOSCOPY W/ URETERAL STENT PLACEMENT Right 01/25/2017   Procedure: CYSTOSCOPY WITH STENT REPLACEMENT;  Surgeon: [name], MD;  Location: [facility];  Service: Urology;  Laterality: Right;  . CYSTOSCOPY W/ URETERAL STENT PLACEMENT Right 03/27/2017   Procedure: CYSTOSCOPY WITH STENT REPLACEMENT;  Surgeon: [name],  MD;  Location: [facility];  Service: Urology;  Laterality: Right;  . CYSTOSCOPY W/ URETERAL STENT PLACEMENT Right 05/31/2017   Procedure: CYSTOSCOPY WITH STENT REPLACEMENT;  Surgeon: [name], MD;  Location: [facility];  Service: Urology;  Laterality: Right;  . CYSTOSCOPY W/ URETERAL STENT PLACEMENT Right 08/02/2017   Procedure: CYSTOSCOPY WITH STENT REPLACEMENT;  Surgeon: [name], MD;  Location: [facility];  Service: Urology;  Laterality: Right;  . CYSTOSCOPY W/ URETERAL STENT PLACEMENT Right 10/29/2017   Procedure: CYSTOSCOPY WITH STENT REPLACEMENT;  Surgeon: Abbie Sons, MD;  Location: [facility];  Service: Urology;  Laterality: Right;  . CYSTOSCOPY W/ URETERAL STENT PLACEMENT Right 02/14/2018   Procedure: CYSTOSCOPY WITH RETROGRADE PYELOGRAM/URETERAL STENT Exchange;  Surgeon: Abbie Sons, MD;  Location: [facility];  Service: Urology;  Laterality: Right;  . CYSTOSCOPY W/ URETERAL STENT PLACEMENT Right 03/18/2019   Procedure: CYSTOSCOPY WITH STENT REPLACEMENT;  Surgeon: Abbie Sons, MD;  Location: [facility];  Service: Urology;  Laterality: Right;  . CYSTOSCOPY W/ URETERAL STENT REMOVAL Left 09/14/2016   Procedure: CYSTOSCOPY WITH STENT REMOVAL;  Surgeon: [name], MD;  Location:  [facility];  Service: Urology;  Laterality: Left;  . CYSTOSCOPY WITH STENT PLACEMENT Left 05/09/2016   Procedure: CYSTOSCOPY WITH STENT PLACEMENT;  Surgeon: [name], MD;  Location: [facility];  Service: Urology;  Laterality: Left;  . CYSTOSCOPY WITH STENT PLACEMENT Right 07/22/2018   Procedure: CYSTOSCOPY WITH STENT Exchange;  Surgeon: Abbie Sons, MD;  Location: [facility];  Service: Urology;  Laterality: Right;  . CYSTOSCOPY WITH STENT PLACEMENT Right 03/08/2020   Procedure: CYSTOSCOPY WITH STENT Exchange;  Surgeon: Abbie Sons, MD;  Location: [facility];  Service: Urology;  Laterality: Right;  . EYE SURGERY Left 2018   tear duct reconstruction  . HERNIA REPAIR  6962    umbilical  . JOINT REPLACEMENT Left 2013   TKR  . JOINT REPLACEMENT Right 2011   TKR  . LAPAROSCOPIC HYSTERECTOMY    . REDUCTION MAMMAPLASTY    . REPLACEMENT TOTAL KNEE Bilateral   . SKIN CANCER EXCISION Left 2018   lower eye lid  . URETEROSCOPY Bilateral 05/09/2016   Procedure: URETEROSCOPY;  Surgeon: [name], MD;  Location: [facility];  Service: Urology;  Laterality: Bilateral;  . URETEROSCOPY WITH HOLMIUM LASER LITHOTRIPSY Left 07/04/2016   Procedure: URETEROSCOPY WITH HOLMIUM LASER LITHOTRIPSY;  Surgeon: [name], MD;  Location: [facility];  Service: Urology;  Laterality: Left;    Home Medications:  Allergies as of 11/15/2020      Reactions   Ibuprofen Itching, Nausea Only, Rash      Medication List       Accurate as of November 15, 2020 11:26 AM. If you have any questions, ask your nurse or doctor.        albuterol 108 (90 Base) MCG/ACT inhaler Commonly known as: VENTOLIN HFA Inhale 2 puffs into the lungs every 4 (four) hours as needed for wheezing or shortness of breath.   alendronate 70 MG tablet Commonly known as: FOSAMAX Take 70 mg by mouth every Friday.   allopurinol 300 MG tablet Commonly known as: ZYLOPRIM Take 300 mg by mouth daily.   atorvastatin 40 MG tablet Commonly known as: LIPITOR Take 40 mg by mouth daily.   Colchicine 0.6 MG Caps Take 0.6 mg by mouth at bedtime.   furosemide 20 MG tablet Commonly known as: LASIX Take 20 mg by mouth daily.   gabapentin 100 MG capsule Commonly known as: NEURONTIN Take 100 mg by mouth 2 (two) times daily.   insulin lispro 100 UNIT/ML KiwkPen Commonly known as: HUMALOG Inject 12 Units into the skin 3 (three) times daily before meals.   levothyroxine 75 MCG tablet Commonly known as: SYNTHROID Take 75 mcg by mouth daily before breakfast.   metFORMIN 1000 MG tablet Commonly known as: GLUCOPHAGE Take 1,000 mg by mouth 2 (two) times daily with a meal.   mirtazapine 30 MG tablet Commonly  known as: Remeron Take 1 tablet (30 mg total) by mouth at bedtime.   OneTouch Ultra test strip Generic drug: glucose blood   pantoprazole 40 MG tablet Commonly known as: PROTONIX Take 40 mg by mouth daily.   QUEtiapine 50 MG tablet Commonly known as: SEROquel Take 1 tablet (50 mg total) by mouth at bedtime.   telmisartan 40 MG tablet Commonly known as: MICARDIS Take 40 mg by mouth daily.   trospium 20 MG tablet Commonly known as: SANCTURA Take 1 tablet (20 mg total) by mouth daily.   umeclidinium-vilanterol 62.5-25 MCG/INH Aepb Commonly known as: ANORO ELLIPTA Inhale 1 puff into the lungs daily.  vitamin B-12 1000 MCG tablet Commonly known as: CYANOCOBALAMIN Take 1,000 mcg by mouth daily.       Allergies:  Allergies  Allergen Reactions  . Ibuprofen Itching, Nausea Only and Rash    Family History: Family History  Problem Relation Age of Onset  . Liver cancer Mother   . Colon cancer Mother   . Breast cancer Mother 59  . Diabetes Daughter   . Kidney disease Daughter        adrenal tumors  . Breast cancer Maternal Aunt   . Kidney cancer Neg Hx   . Prostate cancer Neg Hx     Social History:   reports that she has never smoked. She has never used smokeless tobacco. She reports that she does not drink alcohol and does not use drugs.  Physical Exam: BP 121/69   Pulse (!) 111   Ht 4\' 8"  (1.422 m)   Wt 139 lb (63 kg)   BMI 31.16 kg/m   Constitutional:  Alert and oriented, no acute distress, nontoxic appearing HEENT: [place], AT Cardiovascular: No clubbing, cyanosis, or edema Respiratory: Normal respiratory effort, no increased work of breathing GI: Diffuse abdominal tenderness without rebound or guarding Skin: No rashes, bruises or suspicious lesions Neurologic: Grossly intact, no focal deficits, moving all 4 extremities Psychiatric: Normal mood and affect  Laboratory Data: Results for orders placed or performed in visit on 11/15/20  Microscopic Examination    Urine  Result Value Ref Range   WBC, UA 0-5 0 - 5 /hpf   RBC None seen 0 - 2 /hpf   Epithelial Cells (non renal) 0-10 0 - 10 /hpf   Renal Epithel, UA 0-10 (A) None seen /hpf   Casts Present (A) None seen /lpf   Cast Type Hyaline casts N/A   Crystals Present (A) N/A   Crystal Type Amorphous Sediment N/A   Bacteria, UA None seen None seen/Few  Urinalysis, Complete  Result Value Ref Range   Specific Gravity, UA <1.005 (L) 1.005 - 1.030   pH, UA 5.0 5.0 - 7.5   Color, UA Yellow Yellow   Appearance Ur Hazy (A) Clear   Leukocytes,UA Negative Negative   Protein,UA Trace (A) Negative/Trace   Glucose, UA Negative Negative   Ketones, UA Negative Negative   RBC, UA Negative Negative   Bilirubin, UA Negative Negative   Urobilinogen, Ur 0.2 0.2 - 1.0 mg/dL   Nitrite, UA Negative Negative   Microscopic Examination See below:    Assessment & Plan:   1. Generalized abdominal pain UA benign today with diffuse abdominal pain and findings of large stool burden on CT 3 months ago. I recommend KUB today to confirm appropriate ureteral stent placement and assess stool burden.  In the setting of increased stool burden, will recommend MiraLAX and PCP follow-up.  Patient expressed understanding. - Urinalysis, Complete - DG Abd 1 View; Future   Return if symptoms worsen or fail to improve.  Debroah Loop, PA-C  [facility] [address] [place], [place] [address] [phone]

## 2020-11-17 ENCOUNTER — Telehealth: Payer: Self-pay | Admitting: Physician Assistant

## 2020-11-17 NOTE — Telephone Encounter (Signed)
Incoming call from pt's daughter who states she is calling for results. Per DPR daughter informed of the information below, daughter voiced understanding.

## 2020-11-17 NOTE — Telephone Encounter (Signed)
Please contact the patient and inform her that her abdominal x-ray did not show significant constipation.  Additionally, her stent remains in good position.  I recommend she follow-up with her PCP for further evaluation of her abdominal pain.

## 2020-11-17 NOTE — Telephone Encounter (Signed)
OK per DPR, notified patients granddaughter of message, she verbalized understanding.

## 2020-11-25 ENCOUNTER — Other Ambulatory Visit: Payer: Self-pay

## 2020-11-25 ENCOUNTER — Ambulatory Visit
Admission: RE | Admit: 2020-11-25 | Discharge: 2020-11-25 | Disposition: A | Discharge status: Home / Self Care | Payer: Medicare Other | Source: Ambulatory Visit | Attending: Oncology | Admitting: Oncology

## 2020-11-25 DIAGNOSIS — C439 Malignant melanoma of skin, unspecified: Secondary | ICD-10-CM | POA: Diagnosis not present

## 2020-11-25 HISTORY — DX: Disorder of kidney and ureter, unspecified: N28.9

## 2020-11-25 MED ORDER — IOHEXOL 300 MG/ML  SOLN
75.0000 mL | Freq: Once | INTRAMUSCULAR | Status: AC | PRN
  Administered 2020-11-25: 75 mL via INTRAVENOUS

## 2020-11-28 ENCOUNTER — Telehealth: Payer: Self-pay | Admitting: Oncology

## 2020-11-28 ENCOUNTER — Inpatient Hospital Stay (HOSPITAL_BASED_OUTPATIENT_CLINIC_OR_DEPARTMENT_OTHER): Payer: Medicare Other | Admitting: Oncology

## 2020-11-28 ENCOUNTER — Encounter: Payer: Self-pay | Admitting: Oncology

## 2020-11-28 VITALS — BP 129/71 | HR 93 | Temp 97.2°F | Wt 138.7 lb

## 2020-11-28 DIAGNOSIS — C4359 Malignant melanoma of other part of trunk: Secondary | ICD-10-CM | POA: Diagnosis not present

## 2020-11-28 NOTE — Telephone Encounter (Signed)
Spoke with pt's daughter [name]) to make her aware of appointment with Dr. Donella Stade moved from 5/2 to 5/16. She was agreeable. Sending patient reminder in mail.

## 2020-11-28 NOTE — Progress Notes (Signed)
Hematology/Oncology Consult note [facility]  Telephone:([phone] [phone]  Patient Care Team: [name], MD as PCP - General (Internal Medicine) Erby Pian, MD as Referring Physician (Specialist) Noreene Filbert, MD as Radiation Oncologist (Radiation Oncology) Cameron Sprang, MD as Consulting Physician (Neurology)   Name of the patient: Lori Blanchard  [id number]  [birth year]   Date of visit: 11/28/20  Diagnosis-malignant melanoma of the back  Chief complaint/ Reason for visit-discuss CT scan results and further management  Heme/Onc history: patient is a 75 year old female with a past medical history significant for multiple comorbidities including hypertension hyperlipidemia, depression, obstructive sleep apnea, Alzheimer's disease among other medical problems.  Patient was seen by Dr. Phillip Heal for suspicious skin lesions.She underwent 2 biopsies 1 from her left dorsal hand which came back with squamous cell carcinoma with superficial invasion.  Skin lesion from the left medial scapular back was positive for invasive malignant melanoma.  Breslow depth 2.4 mm.  Ulceration not identified.  Satellitosis not identified.  Vascular invasion not identified.  Regression not identified.  Deep margin focally involved.  PT3a.  Patient needs a definitive resection of this lesion at this time.  She is here with her daughter today and her daughter prefers to get surgery done locally at [facility] if possible.  Patient lives with her daughter and independent for IADLs.  She has a prior history of stage I lung cancer which was treated by radiation   Interval history-patient is here with her daughter today.  She has ongoing fatigue.  Exertional shortness of breath is at her baseline.  She follows up with pulmonary for this  ECOG PS- 2 Pain scale- 0   Review of systems- Review of Systems  Constitutional: Positive for malaise/fatigue. Negative  for chills, fever and weight loss.  HENT: Negative for congestion, ear discharge and nosebleeds.   Eyes: Negative for blurred vision.  Respiratory: Positive for shortness of breath. Negative for cough, hemoptysis, sputum production and wheezing.   Cardiovascular: Negative for chest pain, palpitations, orthopnea and claudication.  Gastrointestinal: Negative for abdominal pain, blood in stool, constipation, diarrhea, heartburn, melena, nausea and vomiting.  Genitourinary: Negative for dysuria, flank pain, frequency, hematuria and urgency.  Musculoskeletal: Negative for back pain, joint pain and myalgias.  Skin: Negative for rash.  Neurological: Negative for dizziness, tingling, focal weakness, seizures, weakness and headaches.  Endo/Heme/Allergies: Does not bruise/bleed easily.  Psychiatric/Behavioral: Negative for depression and suicidal ideas. The patient does not have insomnia.      Allergies  Allergen Reactions  . Ibuprofen Itching, Nausea Only and Rash     Past Medical History:  Diagnosis Date  . Alzheimer disease ([place])   . Anxiety   . Anxiety and depression   . Asthma   . Cancer ([place])    Basal Cell Skin Cancer/ LUNG/RADIATION LAST 8/20  . CHF (congestive heart failure) ([place])    patient denies this diagnosis  . Complication of anesthesia    2/20 oxygen and stayed overnight  . COPD (chronic obstructive pulmonary disease) ([place])   . Depression   . Diabetes ([place])   . Diverticulosis    patient denies problems for this  . Dyspnea   . Elevated lipids   . Fatty liver   . GERD (gastroesophageal reflux disease)   . Gout    no medication at this time  . History of kidney stones   . Hydronephrosis    stage III ckd  . Hyperlipemia   . Hypertension   .  Hypothyroidism   . Lower extremity edema    comes and goes  . Osteoarthritis   . Osteoarthritis   . Osteoporosis   . Pulmonary nodule    has been followed for years, not cancerous. causes breathing issues  . Renal  insufficiency   . Retroperitoneal fibrosis   . Sleep apnea    CPAP  . Ureter injury   . Ureteropelvic junction (UPJ) obstruction    requires change of ureteral stent approximately every 2 to 3 months     Past Surgical History:  Procedure Laterality Date  . ABDOMINAL HYSTERECTOMY     partial  . ANKLE FRACTURE SURGERY Left    pins in ankle but not in correct location  . APPENDECTOMY  1965  . BREAST SURGERY Bilateral 1990   Reduction  . c setion    . CARPAL TUNNEL RELEASE Bilateral 2000  . CESAREAN SECTION  4098,1191, 1969   x 3  . CYSTOSCOPY W/ RETROGRADES Right 11/02/2015   Procedure: CYSTOSCOPY WITH RETROGRADE PYELOGRAM, URETERAL STENT EXCHANGE;  Surgeon: [name], MD;  Location: [facility];  Service: Urology;  Laterality: Right;  . CYSTOSCOPY W/ RETROGRADES Right 01/31/2016   Procedure: CYSTOSCOPY WITH RETROGRADE PYELOGRAM;  Surgeon: [name], MD;  Location: [facility];  Service: Urology;  Laterality: Right;  . CYSTOSCOPY W/ RETROGRADES Bilateral 05/09/2016   Procedure: CYSTOSCOPY WITH RETROGRADE PYELOGRAM;  Surgeon: [name], MD;  Location: [facility];  Service: Urology;  Laterality: Bilateral;  . CYSTOSCOPY W/ RETROGRADES Left 11/21/2016   Procedure: CYSTOSCOPY WITH RETROGRADE PYELOGRAM;  Surgeon: [name], MD;  Location: [facility];  Service: Urology;  Laterality: Left;  . CYSTOSCOPY W/ RETROGRADES Right 03/27/2017   Procedure: CYSTOSCOPY WITH RETROGRADE PYELOGRAM;  Surgeon: [name], MD;  Location: [facility];  Service: Urology;  Laterality: Right;  . CYSTOSCOPY W/ RETROGRADES Bilateral 05/31/2017   Procedure: CYSTOSCOPY WITH RETROGRADE PYELOGRAM;  Surgeon: [name], MD;  Location: [facility];  Service: Urology;  Laterality: Bilateral;  . CYSTOSCOPY W/ RETROGRADES Right 07/22/2018   Procedure: CYSTOSCOPY WITH RETROGRADE PYELOGRAM;  Surgeon: Abbie Sons, MD;  Location: [facility];  Service: Urology;  Laterality: Right;  .  CYSTOSCOPY W/ URETERAL STENT PLACEMENT Right 05/11/2015   Procedure: CYSTOSCOPY WITH STENT REPLACEMENT;  Surgeon: [name], MD;  Location: [facility];  Service: Urology;  Laterality: Right;  . CYSTOSCOPY W/ URETERAL STENT PLACEMENT Right 01/31/2016   Procedure: CYSTOSCOPY, RETROGRADE PYELOGRAMS WITH STENT REPLACEMENT;  Surgeon: [name], MD;  Location: [facility];  Service: Urology;  Laterality: Right;  . CYSTOSCOPY W/ URETERAL STENT PLACEMENT Right 05/09/2016   Procedure: CYSTOSCOPY WITH STENT REPLACEMENT;  Surgeon: [name], MD;  Location: [facility];  Service: Urology;  Laterality: Right;  . CYSTOSCOPY W/ URETERAL STENT PLACEMENT Bilateral 07/04/2016   Procedure: CYSTOSCOPY WITH STENT REPLACEMENT;  Surgeon: [name], MD;  Location: [facility];  Service: Urology;  Laterality: Bilateral;  . CYSTOSCOPY W/ URETERAL STENT PLACEMENT Right 09/14/2016   Procedure: CYSTOSCOPY WITH STENT REPLACEMENT;  Surgeon: [name], MD;  Location: [facility];  Service: Urology;  Laterality: Right;  . CYSTOSCOPY W/ URETERAL STENT PLACEMENT Right 11/21/2016   Procedure: CYSTOSCOPY WITH STENT REPLACEMENT;  Surgeon: [name], MD;  Location: [facility];  Service: Urology;  Laterality: Right;  . CYSTOSCOPY W/ URETERAL STENT PLACEMENT Right 01/25/2017   Procedure: CYSTOSCOPY WITH STENT REPLACEMENT;  Surgeon: [name], MD;  Location: [facility];  Service: Urology;  Laterality:  Right;  Marland Kitchen CYSTOSCOPY W/ URETERAL STENT PLACEMENT Right 03/27/2017   Procedure: CYSTOSCOPY WITH STENT REPLACEMENT;  Surgeon: [name], MD;  Location: [facility];  Service: Urology;  Laterality: Right;  . CYSTOSCOPY W/ URETERAL STENT PLACEMENT Right 05/31/2017   Procedure: CYSTOSCOPY WITH STENT REPLACEMENT;  Surgeon: [name], MD;  Location: [facility];  Service: Urology;  Laterality: Right;  . CYSTOSCOPY W/ URETERAL STENT PLACEMENT Right 08/02/2017   Procedure: CYSTOSCOPY WITH STENT  REPLACEMENT;  Surgeon: [name], MD;  Location: [facility];  Service: Urology;  Laterality: Right;  . CYSTOSCOPY W/ URETERAL STENT PLACEMENT Right 10/29/2017   Procedure: CYSTOSCOPY WITH STENT REPLACEMENT;  Surgeon: Abbie Sons, MD;  Location: [facility];  Service: Urology;  Laterality: Right;  . CYSTOSCOPY W/ URETERAL STENT PLACEMENT Right 02/14/2018   Procedure: CYSTOSCOPY WITH RETROGRADE PYELOGRAM/URETERAL STENT Exchange;  Surgeon: Abbie Sons, MD;  Location: [facility];  Service: Urology;  Laterality: Right;  . CYSTOSCOPY W/ URETERAL STENT PLACEMENT Right 03/18/2019   Procedure: CYSTOSCOPY WITH STENT REPLACEMENT;  Surgeon: Abbie Sons, MD;  Location: [facility];  Service: Urology;  Laterality: Right;  . CYSTOSCOPY W/ URETERAL STENT REMOVAL Left 09/14/2016   Procedure: CYSTOSCOPY WITH STENT REMOVAL;  Surgeon: [name], MD;  Location: [facility];  Service: Urology;  Laterality: Left;  . CYSTOSCOPY WITH STENT PLACEMENT Left 05/09/2016   Procedure: CYSTOSCOPY WITH STENT PLACEMENT;  Surgeon: [name], MD;  Location: [facility];  Service: Urology;  Laterality: Left;  . CYSTOSCOPY WITH STENT PLACEMENT Right 07/22/2018   Procedure: CYSTOSCOPY WITH STENT Exchange;  Surgeon: Abbie Sons, MD;  Location: [facility];  Service: Urology;  Laterality: Right;  . CYSTOSCOPY WITH STENT PLACEMENT Right 03/08/2020   Procedure: CYSTOSCOPY WITH STENT Exchange;  Surgeon: Abbie Sons, MD;  Location: [facility];  Service: Urology;  Laterality: Right;  . EYE SURGERY Left 2018   tear duct reconstruction  . HERNIA REPAIR  9323   umbilical  . JOINT REPLACEMENT Left 2013   TKR  . JOINT REPLACEMENT Right 2011   TKR  . LAPAROSCOPIC HYSTERECTOMY    . REDUCTION MAMMAPLASTY    . REPLACEMENT TOTAL KNEE Bilateral   . SKIN CANCER EXCISION Left 2018   lower eye lid  . URETEROSCOPY Bilateral 05/09/2016   Procedure: URETEROSCOPY;  Surgeon: [name], MD;  Location: [facility];  Service:  Urology;  Laterality: Bilateral;  . URETEROSCOPY WITH HOLMIUM LASER LITHOTRIPSY Left 07/04/2016   Procedure: URETEROSCOPY WITH HOLMIUM LASER LITHOTRIPSY;  Surgeon: [name], MD;  Location: [facility];  Service: Urology;  Laterality: Left;    Social History   Socioeconomic History  . Marital status: Married    Spouse name: Not on file  . Number of children: Not on file  . Years of education: Not on file  . Highest education level: Not on file  Occupational History  . Not on file  Tobacco Use  . Smoking status: Never Smoker  . Smokeless tobacco: Never Used  Vaping Use  . Vaping Use: Never used  Substance and Sexual Activity  . Alcohol use: No    Alcohol/week: 0.0 standard drinks  . Drug use: No  . Sexual activity: Yes    Birth control/protection: Surgical  Other Topics Concern  . Not on file  Social History Narrative   Pt is right handed   Lives alone in single story home (daughter and granddaughter rotate time there so pt is never alone in the  home)   Has 4 children - 1 deceased/3 living   10th grade education   Retired from [employer]   Social Determinants of Corporate investment bankerHealth   Financial Resource Strain: Not on file  Food Insecurity: Not on file  Transportation Needs: Not on file  Physical Activity: Not on file  Stress: Not on file  Social Connections: Not on file  Intimate Partner Violence: Not on file    Family History  Problem Relation Age of Onset  . Liver cancer Mother   . Colon cancer Mother   . Breast cancer Mother [id number]  . Diabetes Daughter   . Kidney disease Daughter        adrenal tumors  . Breast cancer Maternal Aunt   . Kidney cancer Neg Hx   . Prostate cancer Neg Hx      Current Outpatient Medications:  .  albuterol (PROVENTIL HFA;VENTOLIN HFA) 108 (90 Base) MCG/ACT inhaler, Inhale 2 puffs into the lungs every 4 (four) hours as needed for wheezing or shortness of breath. (Patient not taking: No sig reported), Disp: , Rfl:  .  alendronate  (FOSAMAX) 70 MG tablet, Take 70 mg by mouth every Friday. , Disp: , Rfl:  .  allopurinol (ZYLOPRIM) 300 MG tablet, Take 300 mg by mouth daily., Disp: , Rfl:  .  atorvastatin (LIPITOR) 40 MG tablet, Take 40 mg by mouth daily. , Disp: , Rfl:  .  Colchicine 0.6 MG CAPS, Take 0.6 mg by mouth at bedtime., Disp: , Rfl:  .  furosemide (LASIX) 20 MG tablet, Take 20 mg by mouth daily. , Disp: , Rfl:  .  gabapentin (NEURONTIN) 100 MG capsule, Take 100 mg by mouth 2 (two) times daily. , Disp: , Rfl: 0 .  insulin lispro (HUMALOG) 100 UNIT/ML KiwkPen, Inject 12 Units into the skin 3 (three) times daily before meals. , Disp: , Rfl:  .  levothyroxine (SYNTHROID, LEVOTHROID) 75 MCG tablet, Take 75 mcg by mouth daily before breakfast., Disp: , Rfl:  .  metFORMIN (GLUCOPHAGE) 1000 MG tablet, Take 1,000 mg by mouth 2 (two) times daily with a meal., Disp: , Rfl:  .  mirtazapine (REMERON) 30 MG tablet, Take 1 tablet (30 mg total) by mouth at bedtime., Disp: 30 tablet, Rfl: 11 .  ONETOUCH ULTRA test strip, , Disp: , Rfl:  .  pantoprazole (PROTONIX) 40 MG tablet, Take 40 mg by mouth daily. , Disp: , Rfl:  .  QUEtiapine (SEROQUEL) 50 MG tablet, Take 1 tablet (50 mg total) by mouth at bedtime., Disp: 30 tablet, Rfl: 11 .  telmisartan (MICARDIS) 40 MG tablet, Take 40 mg by mouth daily. , Disp: , Rfl:  .  trospium (SANCTURA) 20 MG tablet, Take 1 tablet (20 mg total) by mouth daily. (Patient not taking: Reported on 11/15/2020), Disp: 30 tablet, Rfl: 2 .  umeclidinium-vilanterol (ANORO ELLIPTA) 62.5-25 MCG/INH AEPB, Inhale 1 puff into the lungs daily., Disp: , Rfl:  .  vitamin B-12 (CYANOCOBALAMIN) 1000 MCG tablet, Take 1,000 mcg by mouth daily., Disp: , Rfl:   Physical exam:  Vitals:   11/28/20 1455  BP: 129/71  Pulse: 93  Temp: (!) 97.2 F (36.2 C)  TempSrc: Tympanic  SpO2: 97%  Weight: 138 lb 11.2 oz (62.9 kg)   Physical Exam Constitutional:      Comments: Sitting in a wheelchair.  She is on home oxygen   Cardiovascular:     Rate and Rhythm: Normal rate and regular rhythm.     Heart sounds: Normal  heart sounds.  Pulmonary:     Comments: Effort of breathing mildly increased. Skin:    General: Skin is warm and dry.  Neurological:     Mental Status: She is alert and oriented to person, place, and time.      CMP Latest Ref Rng & Units 11/08/2020  Glucose 70 - 99 mg/dL 126(H)  BUN 8 - 23 mg/dL 22  Creatinine 0.44 - 1.00 mg/dL 1.04(H)  Sodium 135 - 145 mmol/L 137  Potassium 3.5 - 5.1 mmol/L 4.7  Chloride 98 - 111 mmol/L 98  CO2 22 - 32 mmol/L 25  Calcium 8.9 - 10.3 mg/dL 9.6  Total Protein 6.5 - 8.1 g/dL 7.8  Total Bilirubin 0.3 - 1.2 mg/dL 1.0  Alkaline Phos 38 - 126 U/L 105  AST 15 - 41 U/L 38  ALT 0 - 44 U/L 24   CBC Latest Ref Rng & Units 11/08/2020  WBC 4.0 - 10.5 K/uL 12.2(H)  Hemoglobin 12.0 - 15.0 g/dL 11.2(L)  Hematocrit 36.0 - 46.0 % 35.4(L)  Platelets 150 - 400 K/uL 336    No images are attached to the encounter.  DG Abd 1 View  Result Date: 11/16/2020 CLINICAL DATA:  Generalized abdominal pain. Large stool burden on prior CT with chronic UPJ obstruction, right ureteral stent. EXAM: ABDOMEN - 1 VIEW COMPARISON:  CT 08/30/2020 FINDINGS: Right ureteral stent in place. No calcifications are stones along the course of the stent. Nonobstructive bowel gas pattern. No bowel dilatation to suggest obstruction. Decreased stool burden from prior CT. Small volume of stool is currently seen in the ascending and rectosigmoid colon. Multiple pelvic calcifications are typical of phleboliths. Mild chronic changes at the lung bases. No acute osseous abnormalities are seen. IMPRESSION: 1. Right ureteral stent in place. 2. Decreased stool burden from prior CT. Small volume of stool on the current exam. 3. No acute radiographic findings or explanation for pain. Electronically Signed   By: Keith Rake M.D.   On: 11/16/2020 20:03   CT Chest W Contrast  Result Date: 11/28/2020 CLINICAL DATA:   Melanoma. EXAM: CT CHEST WITH CONTRAST TECHNIQUE: Multidetector CT imaging of the chest was performed during intravenous contrast administration. CONTRAST:  31mL OMNIPAQUE IOHEXOL 300 MG/ML  SOLN COMPARISON:  PET 06/01/2020 and CT chest 05/20/2020. FINDINGS: Cardiovascular: Atherosclerotic calcification of the aorta, aortic valve and coronary arteries. Pulmonic trunk and heart are enlarged. No pericardial effusion. Mediastinum/Nodes: Low right paratracheal lymph node measures 11 mm, unchanged. Hilar lymph nodes are not enlarged by CT size criteria. No axillary adenopathy. Esophagus is grossly unremarkable. Lungs/Pleura: Mosaic attenuation with mid and lower lung zone predominant interstitial coarsening, traction bronchiectasis, ground-glass and minimal subpleural reticulation, as on 05/20/2020. Patchy parenchymal consolidation in the right lower lobe, unchanged. Subpleural nodular densities in the lateral segment right middle lobe measure up to 6 mm (3/75), unchanged. No pleural fluid. Debris is seen dependently in trachea. Upper Abdomen: Liver margin is slightly irregular. Visualized portions of the liver, gallbladder and right adrenal gland are unremarkable. Slight nodular thickening of the lateral limb left adrenal gland. Visualized portions of the kidneys, spleen, pancreas, stomach and bowel are otherwise unremarkable. Partially imaged duodenal diverticulum. No upper abdominal adenopathy. Musculoskeletal: Postoperative changes are seen in the upper left posterior dermal/subcutaneous soft tissues. Degenerative changes in the spine. Old bilateral rib fractures. No worrisome lytic or sclerotic lesions. IMPRESSION: 1. No evidence of metastatic disease. Post treatment scarring in the right lower lobe. 2. Pulmonary parenchymal pattern of fibrosis may be due to  chronic hypersensitivity pneumonitis or fibrotic nonspecific interstitial pneumonitis. 3. Slight marginal irregularity of the liver raises suspicion for  cirrhosis. 4. Aortic atherosclerosis (ICD10-I70.0). Coronary artery calcification. 5. Enlarged pulmonic trunk, indicative of pulmonary arterial hypertension. Electronically Signed   By: Lorin Picket M.D.   On: 11/28/2020 08:18     Assessment and plan- Patient is a 75 y.o. female with malignant melanoma involving the back pT3a  Patient will be seeing Dr. Adora Fridge next week to discuss definitive surgical excision and possible sentinel lymph node biopsy.  This is likely stage IIa disease.  CT chest did not show any evidence of metastatic disease.  She has findings of chronic interstitial pneumonitis.  Pulmonary arterial hypertension noted on CT.  CT abdomen in January 2022 was negative as well.  It remains to be seen if she is a surgical candidate at this time.  Based on his recommendations we will decide about further management.   Visit Diagnosis 1. Malignant melanoma of back [facility])      Dr. Randa Evens, MD, MPH [facility] [id number] 11/28/2020 2:55 PM

## 2020-12-05 ENCOUNTER — Encounter: Payer: Self-pay | Admitting: Surgery

## 2020-12-05 ENCOUNTER — Other Ambulatory Visit: Payer: Self-pay

## 2020-12-05 ENCOUNTER — Ambulatory Visit: Payer: Medicare Other | Admitting: Surgery

## 2020-12-05 ENCOUNTER — Ambulatory Visit: Payer: Medicare Other | Admitting: Radiation Oncology

## 2020-12-05 VITALS — BP 160/73 | HR 109 | Temp 98.1°F | Ht 59.0 in | Wt 136.8 lb

## 2020-12-05 DIAGNOSIS — C439 Malignant melanoma of skin, unspecified: Secondary | ICD-10-CM

## 2020-12-05 NOTE — Patient Instructions (Addendum)
If you decide to move forward with surgery please call our office. Please follow up with Dr.Crystal radiation Oncology 12/19/20 @ 2 pm.

## 2020-12-05 NOTE — Progress Notes (Signed)
Patient ID: Lori Blanchard, female   DOB: January 14, 1946, 75 y.o.   MRN: [id number]  HPI Lori Blanchard is a 75 y.o. female seen in consultation at the request of Dr. Janese Banks for melanoma of the left shoulder.  To the scapula on the left side.  He does have significant history of Alzheimer's disease chronic pulmonary issues.  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  Deeper margins cannot be assessed for the lesions on the left hand.  Final pathology on the lesion on the left medial scapular was consistent with invasive melanoma Breslow depth of 2.4 mm.  There was a deep margin that was involved as well. She comes accompanied by her daughter.  She lives currently with her granddaughter.  She wears oxygen 2 L 24/7 and has chronic hypoxia.  He also had a recent history of lung cancer and received radiation therapy.  She does have significant COPD, diabetes and CHF. Her daughter is very concerned that asthma may not tolerate major surgical intervention and they wish not to pursue aggressive surgical intervention. The patient needs help with meals.  She is able to eat on her own but requires somebody to cook for her and remind her about drinking water and liquids.  She does have significant dyspnea on exertion.  Further work-up she did have a recent CT of the chest that I have personally reviewed showing no evidence of any metastatic disease.  I have reviewed the records that are quite extensive.  Pathological specimen measured 16 x 15 x 2 mm  HPI  Past Medical History:  Diagnosis Date  . Alzheimer disease ([place])   . Anxiety   . Anxiety and depression   . Asthma   . Cancer ([place])    Basal Cell Skin Cancer/ LUNG/RADIATION LAST 8/20  . CHF (congestive heart failure) ([place])    patient denies this diagnosis  . Complication of anesthesia    2/20 oxygen and stayed overnight  . COPD (chronic obstructive pulmonary disease) ([place])   . Depression   . Diabetes  ([place])   . Diverticulosis    patient denies problems for this  . Dyspnea   . Elevated lipids   . Fatty liver   . GERD (gastroesophageal reflux disease)   . Gout    no medication at this time  . History of kidney stones   . Hydronephrosis    stage III ckd  . Hyperlipemia   . Hypertension   . Hypothyroidism   . Lower extremity edema    comes and goes  . Osteoarthritis   . Osteoarthritis   . Osteoporosis   . Pulmonary nodule    has been followed for years, not cancerous. causes breathing issues  . Renal insufficiency   . Retroperitoneal fibrosis   . Sleep apnea    CPAP  . Ureter injury   . Ureteropelvic junction (UPJ) obstruction    requires change of ureteral stent approximately every 2 to 3 months    Past Surgical History:  Procedure Laterality Date  . ABDOMINAL HYSTERECTOMY     partial  . ANKLE FRACTURE SURGERY Left    pins in ankle but not in correct location  . APPENDECTOMY  1965  . BREAST SURGERY Bilateral 1990   Reduction  . c setion    . CARPAL TUNNEL RELEASE Bilateral 2000  . CESAREAN SECTION  9924,2683, 1969   x 3  . CYSTOSCOPY W/ RETROGRADES Right 11/02/2015  Procedure: CYSTOSCOPY WITH RETROGRADE PYELOGRAM, URETERAL STENT EXCHANGE;  Surgeon: [name], MD;  Location: [facility];  Service: Urology;  Laterality: Right;  . CYSTOSCOPY W/ RETROGRADES Right 01/31/2016   Procedure: CYSTOSCOPY WITH RETROGRADE PYELOGRAM;  Surgeon: [name], MD;  Location: [facility];  Service: Urology;  Laterality: Right;  . CYSTOSCOPY W/ RETROGRADES Bilateral 05/09/2016   Procedure: CYSTOSCOPY WITH RETROGRADE PYELOGRAM;  Surgeon: [name], MD;  Location: [facility];  Service: Urology;  Laterality: Bilateral;  . CYSTOSCOPY W/ RETROGRADES Left 11/21/2016   Procedure: CYSTOSCOPY WITH RETROGRADE PYELOGRAM;  Surgeon: [name], MD;  Location: [facility];  Service: Urology;  Laterality: Left;  . CYSTOSCOPY W/ RETROGRADES Right 03/27/2017   Procedure: CYSTOSCOPY  WITH RETROGRADE PYELOGRAM;  Surgeon: [name], MD;  Location: [facility];  Service: Urology;  Laterality: Right;  . CYSTOSCOPY W/ RETROGRADES Bilateral 05/31/2017   Procedure: CYSTOSCOPY WITH RETROGRADE PYELOGRAM;  Surgeon: [name], MD;  Location: [facility];  Service: Urology;  Laterality: Bilateral;  . CYSTOSCOPY W/ RETROGRADES Right 07/22/2018   Procedure: CYSTOSCOPY WITH RETROGRADE PYELOGRAM;  Surgeon: Abbie Sons, MD;  Location: [facility];  Service: Urology;  Laterality: Right;  . CYSTOSCOPY W/ URETERAL STENT PLACEMENT Right 05/11/2015   Procedure: CYSTOSCOPY WITH STENT REPLACEMENT;  Surgeon: [name], MD;  Location: [facility];  Service: Urology;  Laterality: Right;  . CYSTOSCOPY W/ URETERAL STENT PLACEMENT Right 01/31/2016   Procedure: CYSTOSCOPY, RETROGRADE PYELOGRAMS WITH STENT REPLACEMENT;  Surgeon: [name], MD;  Location: [facility];  Service: Urology;  Laterality: Right;  . CYSTOSCOPY W/ URETERAL STENT PLACEMENT Right 05/09/2016   Procedure: CYSTOSCOPY WITH STENT REPLACEMENT;  Surgeon: [name], MD;  Location: [facility];  Service: Urology;  Laterality: Right;  . CYSTOSCOPY W/ URETERAL STENT PLACEMENT Bilateral 07/04/2016   Procedure: CYSTOSCOPY WITH STENT REPLACEMENT;  Surgeon: [name], MD;  Location: [facility];  Service: Urology;  Laterality: Bilateral;  . CYSTOSCOPY W/ URETERAL STENT PLACEMENT Right 09/14/2016   Procedure: CYSTOSCOPY WITH STENT REPLACEMENT;  Surgeon: [name], MD;  Location: [facility];  Service: Urology;  Laterality: Right;  . CYSTOSCOPY W/ URETERAL STENT PLACEMENT Right 11/21/2016   Procedure: CYSTOSCOPY WITH STENT REPLACEMENT;  Surgeon: [name], MD;  Location: [facility];  Service: Urology;  Laterality: Right;  . CYSTOSCOPY W/ URETERAL STENT PLACEMENT Right 01/25/2017   Procedure: CYSTOSCOPY WITH STENT REPLACEMENT;  Surgeon: [name], MD;  Location: [facility];  Service: Urology;   Laterality: Right;  . CYSTOSCOPY W/ URETERAL STENT PLACEMENT Right 03/27/2017   Procedure: CYSTOSCOPY WITH STENT REPLACEMENT;  Surgeon: [name], MD;  Location: [facility];  Service: Urology;  Laterality: Right;  . CYSTOSCOPY W/ URETERAL STENT PLACEMENT Right 05/31/2017   Procedure: CYSTOSCOPY WITH STENT REPLACEMENT;  Surgeon: [name], MD;  Location: [facility];  Service: Urology;  Laterality: Right;  . CYSTOSCOPY W/ URETERAL STENT PLACEMENT Right 08/02/2017   Procedure: CYSTOSCOPY WITH STENT REPLACEMENT;  Surgeon: [name], MD;  Location: [facility];  Service: Urology;  Laterality: Right;  . CYSTOSCOPY W/ URETERAL STENT PLACEMENT Right 10/29/2017   Procedure: CYSTOSCOPY WITH STENT REPLACEMENT;  Surgeon: Abbie Sons, MD;  Location: [facility];  Service: Urology;  Laterality: Right;  . CYSTOSCOPY W/ URETERAL STENT PLACEMENT Right 02/14/2018   Procedure: CYSTOSCOPY WITH RETROGRADE PYELOGRAM/URETERAL STENT Exchange;  Surgeon: Abbie Sons, MD;  Location: [facility];  Service: Urology;  Laterality: Right;  . CYSTOSCOPY W/ URETERAL STENT  PLACEMENT Right 03/18/2019   Procedure: CYSTOSCOPY WITH STENT REPLACEMENT;  Surgeon: Abbie Sons, MD;  Location: [facility];  Service: Urology;  Laterality: Right;  . CYSTOSCOPY W/ URETERAL STENT REMOVAL Left 09/14/2016   Procedure: CYSTOSCOPY WITH STENT REMOVAL;  Surgeon: [name], MD;  Location: [facility];  Service: Urology;  Laterality: Left;  . CYSTOSCOPY WITH STENT PLACEMENT Left 05/09/2016   Procedure: CYSTOSCOPY WITH STENT PLACEMENT;  Surgeon: [name], MD;  Location: [facility];  Service: Urology;  Laterality: Left;  . CYSTOSCOPY WITH STENT PLACEMENT Right 07/22/2018   Procedure: CYSTOSCOPY WITH STENT Exchange;  Surgeon: Abbie Sons, MD;  Location: [facility];  Service: Urology;  Laterality: Right;  . CYSTOSCOPY WITH STENT PLACEMENT Right 03/08/2020   Procedure: CYSTOSCOPY WITH STENT Exchange;  Surgeon: Abbie Sons, MD;  Location: [facility];  Service: Urology;  Laterality: Right;  . EYE SURGERY Left 2018   tear duct reconstruction  . HERNIA REPAIR  3716   umbilical  . JOINT REPLACEMENT Left 2013   TKR  . JOINT REPLACEMENT Right 2011   TKR  . LAPAROSCOPIC HYSTERECTOMY    . REDUCTION MAMMAPLASTY    . REPLACEMENT TOTAL KNEE Bilateral   . SKIN CANCER EXCISION Left 2018   lower eye lid  . URETEROSCOPY Bilateral 05/09/2016   Procedure: URETEROSCOPY;  Surgeon: [name], MD;  Location: [facility];  Service: Urology;  Laterality: Bilateral;  . URETEROSCOPY WITH HOLMIUM LASER LITHOTRIPSY Left 07/04/2016   Procedure: URETEROSCOPY WITH HOLMIUM LASER LITHOTRIPSY;  Surgeon: [name], MD;  Location: [facility];  Service: Urology;  Laterality: Left;    Family History  Problem Relation Age of Onset  . Liver cancer Mother   . Colon cancer Mother   . Breast cancer Mother 20  . Diabetes Daughter   . Kidney disease Daughter        adrenal tumors  . Breast cancer Maternal Aunt   . Kidney cancer Neg Hx   . Prostate cancer Neg Hx     Social History Social History   Tobacco Use  . Smoking status: Never Smoker  . Smokeless tobacco: Never Used  Vaping Use  . Vaping Use: Never used  Substance Use Topics  . Alcohol use: No    Alcohol/week: 0.0 standard drinks  . Drug use: No    Allergies  Allergen Reactions  . Ibuprofen Itching, Nausea Only and Rash    Current Outpatient Medications  Medication Sig Dispense Refill  . albuterol (PROVENTIL HFA;VENTOLIN HFA) 108 (90 Base) MCG/ACT inhaler Inhale 2 puffs into the lungs every 4 (four) hours as needed for wheezing or shortness of breath.    Marland Kitchen alendronate (FOSAMAX) 70 MG tablet Take 70 mg by mouth every Friday.     Marland Kitchen allopurinol (ZYLOPRIM) 300 MG tablet Take 300 mg by mouth daily.    Marland Kitchen atorvastatin (LIPITOR) 40 MG tablet Take 40 mg by mouth daily.     . Colchicine 0.6 MG CAPS Take 0.6 mg by mouth at bedtime.    . furosemide (LASIX)  20 MG tablet Take 20 mg by mouth daily.     Marland Kitchen gabapentin (NEURONTIN) 100 MG capsule Take 100 mg by mouth 2 (two) times daily.   0  . insulin lispro (HUMALOG) 100 UNIT/ML KiwkPen Inject 12 Units into the skin 3 (three) times daily before meals.     Marland Kitchen levothyroxine (SYNTHROID, LEVOTHROID) 75 MCG tablet Take 75 mcg by mouth daily before breakfast.    .  metFORMIN (GLUCOPHAGE) 1000 MG tablet Take 1,000 mg by mouth 2 (two) times daily with a meal.    . mirtazapine (REMERON) 30 MG tablet Take 1 tablet (30 mg total) by mouth at bedtime. 30 tablet 11  . ONETOUCH ULTRA test strip     . pantoprazole (PROTONIX) 40 MG tablet Take 40 mg by mouth daily.     . QUEtiapine (SEROQUEL) 50 MG tablet Take 1 tablet (50 mg total) by mouth at bedtime. 30 tablet 11  . telmisartan (MICARDIS) 40 MG tablet Take 40 mg by mouth daily.     . trospium (SANCTURA) 20 MG tablet Take 1 tablet (20 mg total) by mouth daily. 30 tablet 2  . umeclidinium-vilanterol (ANORO ELLIPTA) 62.5-25 MCG/INH AEPB Inhale 1 puff into the lungs daily.    . vitamin B-12 (CYANOCOBALAMIN) 1000 MCG tablet Take 1,000 mcg by mouth daily.     No current facility-administered medications for this visit.     Review of Systems Full ROS  was asked and was negative except for the information on the HPI  Physical Exam Blood pressure (!) 160/73, pulse (!) 109, temperature 98.1 F (36.7 C), temperature source Oral, height 4\' 11"  (1.499 m), weight 136 lb 12.8 oz (62.1 kg), SpO2 96 %. CONSTITUTIONAL: debilitated elderly female wearing oxygen EYES: Pupils are equal, roundSclera are non-icteric. EARS, NOSE, MOUTH AND THROAT: she is wearing a mask. Hearing is intact to voice. LYMPH NODES:  Lymph nodes in the neck are normal. RESPIRATORY:  Lungs are decreased on bases bilaterally. There is normal respiratory effort, with equal breath sounds bilaterally, and without pathologic use of accessory muscles. CARDIOVASCULAR: Heart is regular without murmurs, gallops, or  rubs. GI: The abdomen is  soft, nontender, and nondistended. There are no palpable masses. There is no hepatosplenomegaly. There are normal bowel sounds in all quadrants. GU: Rectal deferred.   MUSCULOSKELETAL: Normal muscle strength and tone. No cyanosis or edema.   SKIN: There is evidence of a 4 cm scar superior and medial to the left scapula.  There is no evidence of satellite lesions.  There is an adjacent pink lesion that is inferior more medial.  NEUROLOGIC: Motor and sensation is grossly normal. Cranial nerves are grossly intact. PSYCH:  Oriented to person, place and time. Affect is normal.  Data Reviewed  I have personally reviewed the patient's imaging, laboratory findings and medical records.    Assessment/Plan 75 year old female with dementia and significant pulmonary issues on home oxygen now recent diagnosis with invasive malignant melanoma of the left upper back medial to the left scapula Depth of invasion is 2.4 mm and there was evidence of positive deep margin. Is cussed with the daughter in detail.  Options from radical resection and sentinel lymph node biopsy to local reexcision and potential surveillance were discussed at length.  The main issue is her pulmonary issues as well as her dementia.  I do not necessarily think that she will tolerate a major oncologic resection.  Daughter is not interested in pursuing any sentinel lymph node or radical resection.  She is inquiring about the possibility of radiation therapy.  The best I can do is reexcision under local anesthetic here in the office.  She understands that this is not optimal therapy and would not be able to stage her disease.  She understands.  Extensive counseling provided.  She will call if she wishes to have a local excision here in the office.  Time spent with the patient was 60 minutes, with more than  50% of the time spent in face-to-face education, counseling and care coordination.     Caroleen Hamman, MD  FACS General Surgeon 12/05/2020, 2:20 PM

## 2020-12-19 ENCOUNTER — Encounter: Payer: Self-pay | Admitting: Radiation Oncology

## 2020-12-19 ENCOUNTER — Ambulatory Visit
Admission: RE | Admit: 2020-12-19 | Discharge: 2020-12-19 | Disposition: A | Discharge status: Home / Self Care | Payer: Medicare Other | Source: Ambulatory Visit | Attending: Radiation Oncology | Admitting: Radiation Oncology

## 2020-12-19 ENCOUNTER — Other Ambulatory Visit: Payer: Self-pay

## 2020-12-19 VITALS — BP 118/62 | HR 99 | Temp 98.0°F | Resp 16 | Wt 134.0 lb

## 2020-12-19 DIAGNOSIS — C439 Malignant melanoma of skin, unspecified: Secondary | ICD-10-CM

## 2020-12-19 NOTE — Progress Notes (Signed)
Radiation Oncology Follow up Note old patient new area malignant melanoma of her back  Name: Lori Blanchard   Date:   12/19/2020 MRN:  [id number] DOB: 1945/12/11    This 75 y.o. female presents to the clinic today for evaluation of a malignant melanoma of her back incompletely excised in patient with.  History of SBRT close to 2 years prior for presumed non-small cell lung cancer of the right lower lobe  REFERRING PROVIDER: [name], MD  HPI: Patient is a 75 year old.  Female now out close to 2 years having completed SBRT to her right lower lobe for presumed stage I non-small cell lung cancer.  From that standpoint she is doing well.  She had a recent CT scan showing no evidence of metastatic disease she had some posttreatment scarring to right lower lobe.  She has early Alzheimer's is also on continuous nasal oxygen.  She recently had a malignant melanoma removed from her left back which was a Breslow depth of 2.4 mm.  Deep margin was focally involved it was a P T3a lesion.  She was referred back to the surgeon although based on the patient's overall poor general condition and comorbidities not thought to be a surgical candidate for in office excision.  The daughter has requested radiation oncology opinion and lieu of further surgical excision.  The patient is a poor historian although she is having no significant complaints from her recent excision.  COMPLICATIONS OF TREATMENT: none  FOLLOW UP COMPLIANCE: keeps appointments   PHYSICAL EXAM:  BP 118/62   Pulse 99   Temp 98 F (36.7 C)   Resp 16   Wt 134 lb (60.8 kg)   SpO2 91%   BMI 27.06 kg/m  Area of scar on her posterior back well-healed no evidence of melanoma at this time.  No evidence of supraclavicular or axillary adenopathy.  Well-developed well-nourished patient in NAD. HEENT reveals PERLA, EOMI, discs not visualized.  Oral cavity is clear. No oral mucosal lesions are identified. Neck is clear without evidence of  cervical or supraclavicular adenopathy. Lungs are clear to A&P. Cardiac examination is essentially unremarkable with regular rate and rhythm without murmur rub or thrill. Abdomen is benign with no organomegaly or masses noted. Motor sensory and DTR levels are equal and symmetric in the upper and lower extremities. Cranial nerves II through XII are grossly intact. Proprioception is intact. No peripheral adenopathy or edema is identified. No motor or sensory levels are noted. Crude visual fields are within normal range.  RADIOLOGY RESULTS: CT scans reviewed compatible with above-stated findings  PLAN: Present time I have offered adjuvant radiation therapy based on inability to do reexcision with the involved deep margin.  I would use electron-beam therapy and treat to 5000 cGy in 20 fractions at 2.5 Pearline Cables per fraction.  Risks and benefits of treatment including skin reaction fatigue were reviewed with the patient and her daughter.  We will attempt simulation and treatment planning later this week.  Side effects including skin reaction and fatigue were all reviewed with the patient and her daughter.  I would like to take this opportunity to thank you for allowing me to participate in the care of your patient.Noreene Filbert, MD

## 2020-12-26 ENCOUNTER — Ambulatory Visit: Payer: Medicare Other

## 2021-01-05 ENCOUNTER — Ambulatory Visit: Payer: Medicare Other

## 2021-01-05 DIAGNOSIS — Z510 Encounter for antineoplastic radiation therapy: Secondary | ICD-10-CM | POA: Insufficient documentation

## 2021-01-05 DIAGNOSIS — C439 Malignant melanoma of skin, unspecified: Secondary | ICD-10-CM | POA: Diagnosis present

## 2021-01-09 DIAGNOSIS — Z510 Encounter for antineoplastic radiation therapy: Secondary | ICD-10-CM | POA: Diagnosis not present

## 2021-01-12 ENCOUNTER — Ambulatory Visit
Admission: RE | Admit: 2021-01-12 | Discharge: 2021-01-12 | Disposition: A | Discharge status: Home / Self Care | Payer: Medicare Other | Source: Ambulatory Visit | Attending: Radiation Oncology | Admitting: Radiation Oncology

## 2021-01-12 DIAGNOSIS — Z510 Encounter for antineoplastic radiation therapy: Secondary | ICD-10-CM | POA: Diagnosis not present

## 2021-01-13 ENCOUNTER — Ambulatory Visit
Admission: RE | Admit: 2021-01-13 | Discharge: 2021-01-13 | Disposition: A | Discharge status: Home / Self Care | Payer: Medicare Other | Source: Ambulatory Visit | Attending: Radiation Oncology | Admitting: Radiation Oncology

## 2021-01-13 DIAGNOSIS — Z510 Encounter for antineoplastic radiation therapy: Secondary | ICD-10-CM | POA: Diagnosis not present

## 2021-01-16 ENCOUNTER — Ambulatory Visit
Admission: RE | Admit: 2021-01-16 | Discharge: 2021-01-16 | Disposition: A | Discharge status: Home / Self Care | Payer: Medicare Other | Source: Ambulatory Visit | Attending: Radiation Oncology | Admitting: Radiation Oncology

## 2021-01-16 DIAGNOSIS — Z510 Encounter for antineoplastic radiation therapy: Secondary | ICD-10-CM | POA: Diagnosis not present

## 2021-01-17 ENCOUNTER — Ambulatory Visit
Admission: RE | Admit: 2021-01-17 | Discharge: 2021-01-17 | Disposition: A | Discharge status: Home / Self Care | Payer: Medicare Other | Source: Ambulatory Visit | Attending: Radiation Oncology | Admitting: Radiation Oncology

## 2021-01-17 DIAGNOSIS — Z510 Encounter for antineoplastic radiation therapy: Secondary | ICD-10-CM | POA: Diagnosis not present

## 2021-01-18 ENCOUNTER — Ambulatory Visit
Admission: RE | Admit: 2021-01-18 | Discharge: 2021-01-18 | Disposition: A | Discharge status: Home / Self Care | Payer: Medicare Other | Source: Ambulatory Visit | Attending: Radiation Oncology | Admitting: Radiation Oncology

## 2021-01-18 DIAGNOSIS — Z510 Encounter for antineoplastic radiation therapy: Secondary | ICD-10-CM | POA: Diagnosis not present

## 2021-01-19 ENCOUNTER — Ambulatory Visit
Admission: RE | Admit: 2021-01-19 | Discharge: 2021-01-19 | Disposition: A | Discharge status: Home / Self Care | Payer: Medicare Other | Source: Ambulatory Visit | Attending: Radiation Oncology | Admitting: Radiation Oncology

## 2021-01-19 DIAGNOSIS — Z510 Encounter for antineoplastic radiation therapy: Secondary | ICD-10-CM | POA: Diagnosis not present

## 2021-01-20 ENCOUNTER — Ambulatory Visit
Admission: RE | Admit: 2021-01-20 | Discharge: 2021-01-20 | Disposition: A | Discharge status: Home / Self Care | Payer: Medicare Other | Source: Ambulatory Visit | Attending: Radiation Oncology | Admitting: Radiation Oncology

## 2021-01-20 DIAGNOSIS — Z510 Encounter for antineoplastic radiation therapy: Secondary | ICD-10-CM | POA: Diagnosis not present

## 2021-01-23 ENCOUNTER — Ambulatory Visit
Admission: RE | Admit: 2021-01-23 | Discharge: 2021-01-23 | Disposition: A | Discharge status: Home / Self Care | Payer: Medicare Other | Source: Ambulatory Visit | Attending: Radiation Oncology | Admitting: Radiation Oncology

## 2021-01-23 DIAGNOSIS — Z510 Encounter for antineoplastic radiation therapy: Secondary | ICD-10-CM | POA: Diagnosis not present

## 2021-01-24 ENCOUNTER — Ambulatory Visit
Admission: RE | Admit: 2021-01-24 | Discharge: 2021-01-24 | Disposition: A | Discharge status: Home / Self Care | Payer: Medicare Other | Source: Ambulatory Visit | Attending: Radiation Oncology | Admitting: Radiation Oncology

## 2021-01-24 DIAGNOSIS — Z510 Encounter for antineoplastic radiation therapy: Secondary | ICD-10-CM | POA: Diagnosis not present

## 2021-01-25 ENCOUNTER — Ambulatory Visit
Admission: RE | Admit: 2021-01-25 | Discharge: 2021-01-25 | Disposition: A | Discharge status: Home / Self Care | Payer: Medicare Other | Source: Ambulatory Visit | Attending: Radiation Oncology | Admitting: Radiation Oncology

## 2021-01-25 DIAGNOSIS — Z510 Encounter for antineoplastic radiation therapy: Secondary | ICD-10-CM | POA: Diagnosis not present

## 2021-01-26 ENCOUNTER — Ambulatory Visit
Admission: RE | Admit: 2021-01-26 | Discharge: 2021-01-26 | Disposition: A | Discharge status: Home / Self Care | Payer: Medicare Other | Source: Ambulatory Visit | Attending: Radiation Oncology | Admitting: Radiation Oncology

## 2021-01-26 DIAGNOSIS — Z510 Encounter for antineoplastic radiation therapy: Secondary | ICD-10-CM | POA: Diagnosis not present

## 2021-01-27 ENCOUNTER — Ambulatory Visit
Admission: RE | Admit: 2021-01-27 | Discharge: 2021-01-27 | Disposition: A | Discharge status: Home / Self Care | Payer: Medicare Other | Source: Ambulatory Visit | Attending: Radiation Oncology | Admitting: Radiation Oncology

## 2021-01-27 DIAGNOSIS — Z510 Encounter for antineoplastic radiation therapy: Secondary | ICD-10-CM | POA: Diagnosis not present

## 2021-01-30 ENCOUNTER — Ambulatory Visit
Admission: RE | Admit: 2021-01-30 | Discharge: 2021-01-30 | Disposition: A | Discharge status: Home / Self Care | Payer: Medicare Other | Source: Ambulatory Visit | Attending: Radiation Oncology | Admitting: Radiation Oncology

## 2021-01-30 DIAGNOSIS — Z510 Encounter for antineoplastic radiation therapy: Secondary | ICD-10-CM | POA: Diagnosis not present

## 2021-01-31 ENCOUNTER — Ambulatory Visit
Admission: RE | Admit: 2021-01-31 | Discharge: 2021-01-31 | Disposition: A | Discharge status: Home / Self Care | Payer: Medicare Other | Source: Ambulatory Visit | Attending: Radiation Oncology | Admitting: Radiation Oncology

## 2021-01-31 DIAGNOSIS — Z510 Encounter for antineoplastic radiation therapy: Secondary | ICD-10-CM | POA: Diagnosis not present

## 2021-02-01 ENCOUNTER — Ambulatory Visit
Admission: RE | Admit: 2021-02-01 | Discharge: 2021-02-01 | Disposition: A | Discharge status: Home / Self Care | Payer: Medicare Other | Source: Ambulatory Visit | Attending: Radiation Oncology | Admitting: Radiation Oncology

## 2021-02-01 DIAGNOSIS — Z510 Encounter for antineoplastic radiation therapy: Secondary | ICD-10-CM | POA: Diagnosis not present

## 2021-02-02 ENCOUNTER — Ambulatory Visit
Admission: RE | Admit: 2021-02-02 | Discharge: 2021-02-02 | Disposition: A | Discharge status: Home / Self Care | Payer: Medicare Other | Source: Ambulatory Visit | Attending: Radiation Oncology | Admitting: Radiation Oncology

## 2021-02-02 DIAGNOSIS — Z510 Encounter for antineoplastic radiation therapy: Secondary | ICD-10-CM | POA: Diagnosis not present

## 2021-02-03 ENCOUNTER — Ambulatory Visit
Admission: RE | Admit: 2021-02-03 | Discharge: 2021-02-03 | Disposition: A | Discharge status: Home / Self Care | Payer: Medicare Other | Source: Ambulatory Visit | Attending: Radiation Oncology | Admitting: Radiation Oncology

## 2021-02-03 DIAGNOSIS — Z510 Encounter for antineoplastic radiation therapy: Secondary | ICD-10-CM | POA: Diagnosis present

## 2021-02-03 DIAGNOSIS — C439 Malignant melanoma of skin, unspecified: Secondary | ICD-10-CM | POA: Insufficient documentation

## 2021-02-07 ENCOUNTER — Ambulatory Visit
Admission: RE | Admit: 2021-02-07 | Discharge: 2021-02-07 | Disposition: A | Discharge status: Home / Self Care | Payer: Medicare Other | Source: Ambulatory Visit | Attending: Radiation Oncology | Admitting: Radiation Oncology

## 2021-02-07 DIAGNOSIS — Z510 Encounter for antineoplastic radiation therapy: Secondary | ICD-10-CM | POA: Diagnosis not present

## 2021-02-08 ENCOUNTER — Ambulatory Visit
Admission: RE | Admit: 2021-02-08 | Discharge: 2021-02-08 | Disposition: A | Discharge status: Home / Self Care | Payer: Medicare Other | Source: Ambulatory Visit | Attending: Radiation Oncology | Admitting: Radiation Oncology

## 2021-02-08 DIAGNOSIS — Z510 Encounter for antineoplastic radiation therapy: Secondary | ICD-10-CM | POA: Diagnosis not present

## 2021-02-09 ENCOUNTER — Ambulatory Visit
Admission: RE | Admit: 2021-02-09 | Discharge: 2021-02-09 | Disposition: A | Discharge status: Home / Self Care | Payer: Medicare Other | Source: Ambulatory Visit | Attending: Radiation Oncology | Admitting: Radiation Oncology

## 2021-02-09 DIAGNOSIS — Z510 Encounter for antineoplastic radiation therapy: Secondary | ICD-10-CM | POA: Diagnosis not present

## 2021-02-21 ENCOUNTER — Ambulatory Visit: Payer: Medicare Other | Admitting: Neurology

## 2021-03-06 DEATH — deceased

## 2021-03-22 ENCOUNTER — Ambulatory Visit: Payer: Medicare Other | Admitting: Radiation Oncology

## 2021-05-16 ENCOUNTER — Ambulatory Visit: Payer: Medicare Other | Admitting: Physician Assistant

## 2021-08-02 IMAGING — CT NM PET TUM IMG RESTAG (PS) SKULL BASE T - THIGH
1 of 9 series · 1 of 25 positions shown · non-contrast
Comparison: Multiple exams, including 10/01/2018 and chest CT from
05/20/2020

CLINICAL DATA: Subsequent treatment strategy for non-small cell
lung cancer.

EXAM:
NUCLEAR MEDICINE PET SKULL BASE TO THIGH
TECHNIQUE: 7.0 mCi F-18 FDG was injected intravenously. Full-ring PET imaging
was performed from the skull base to thigh after the radiotracer. CT
data was obtained and used for attenuation correction and anatomic
localization.
Fasting blood glucose: 123 mg/dl

[Series 5: pet wb uncorrected (nac) · axial · 5.0mm · 4.07mm/px · 1 of 251 slices shown]
[im 167/251]
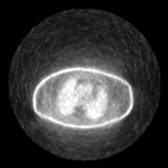

[1 of 25 positions shown; findings below may reference images not displayed]

FINDINGS: Mediastinal blood pool activity: SUV max

Liver activity: SUV max NA

NECK: No significant abnormal hypermetabolic activity in this
region.

Incidental CT findings: Unremarkable

CHEST: Right lower lobe consolidation in the vicinity of the prior
pulmonary nodule, maximum SUV in this vicinity 2.8, probably from
radiation pneumonitis. Previously the pulmonary nodule had a maximum
SUV of 4.0.

A right lower paratracheal lymph node with fatty hilum has a short
axis diameter 1.2 cm on image 80 of series 3, and maximum SUV of
(formerly 2.4), below blood pool levels and likely benign.

The mild right middle lobe subpleural nodularity shown on the
05/20/2020 exam measures about 0.5 by 0.3 cm on image 103 of series
3 and does not have any associated accentuated metabolic activity
but is below sensitive PET-CT size thresholds.

Incidental CT findings: Scattered reticular and ground-glass
opacities in the lung bases. Emphysema. Coronary, aortic arch, and
branch vessel atherosclerotic vascular disease. Mild cardiomegaly.

ABDOMEN/PELVIS: Scattered accentuated bowel activity is likely
physiologic.

Incidental CT findings: Right double-J ureteral stent. Aortoiliac
atherosclerotic vascular disease. Small periampullary duodenal
diverticulum.

SKELETON: No significant abnormal hypermetabolic activity in this
region.

Incidental CT findings: Left greater than right degenerative
sternoclavicular arthropathy. Grade 1 degenerative anterolisthesis
at L4-5. Thoracic and lumbar spondylosis and degenerative disc
disease.
IMPRESSION: 1. Low-grade activity in the vicinity of the right lower lobe
consolidation corresponding to expected region of radiation
pneumonitis. Maximum SUV in this vicinity is 2.8, minimally above
the blood pool, and well below the prior SUV of the pulmonary nodule
shown on 10/01/2018. Currently no evidence of recurrent or active
malignancy, surveillance is likely warranted.
2. Other imaging findings of potential clinical significance: Aortic
Atherosclerosis (TJDP3-SW1.1) and Emphysema (TJDP3-8N0.W). Mild
cardiomegaly. Coronary atherosclerosis. Scattered reticular and
ground-glass opacities in the lung bases, stable. Right double-J
ureteral stent. The thoracolumbar spondylosis and degenerative disc
disease.

## 2023-08-08 NOTE — Telephone Encounter (Signed)
 Error
